# Patient Record
Sex: Female | Born: 1935 | Race: White | Hispanic: No | Marital: Married | Smoking: Never smoker
Health system: Southern US, Community
[De-identification: ages and names within clinical notes are randomized; demographics above are authoritative.]

## PROBLEM LIST (undated history)

## (undated) DIAGNOSIS — I2510 Atherosclerotic heart disease of native coronary artery without angina pectoris: Secondary | ICD-10-CM

## (undated) DIAGNOSIS — C801 Malignant (primary) neoplasm, unspecified: Secondary | ICD-10-CM

## (undated) DIAGNOSIS — J189 Pneumonia, unspecified organism: Secondary | ICD-10-CM

## (undated) DIAGNOSIS — I10 Essential (primary) hypertension: Secondary | ICD-10-CM

## (undated) DIAGNOSIS — I209 Angina pectoris, unspecified: Secondary | ICD-10-CM

## (undated) DIAGNOSIS — M1990 Unspecified osteoarthritis, unspecified site: Secondary | ICD-10-CM

## (undated) DIAGNOSIS — R51 Headache: Secondary | ICD-10-CM

## (undated) DIAGNOSIS — R011 Cardiac murmur, unspecified: Secondary | ICD-10-CM

## (undated) HISTORY — PX: TONSILLECTOMY: SUR1361

## (undated) HISTORY — PX: TOTAL KNEE ARTHROPLASTY: SHX125

## (undated) HISTORY — PX: OTHER SURGICAL HISTORY: SHX169

## (undated) HISTORY — PX: KNEE SURGERY: SHX244

## (undated) HISTORY — PX: ABDOMINAL HYSTERECTOMY: SHX81

## (undated) HISTORY — PX: APPENDECTOMY: SHX54

---

## 2004-05-24 ENCOUNTER — Other Ambulatory Visit: Payer: Self-pay

## 2004-10-19 ENCOUNTER — Emergency Department: Payer: Self-pay | Admitting: Emergency Medicine

## 2004-11-23 ENCOUNTER — Ambulatory Visit: Payer: Self-pay | Admitting: Internal Medicine

## 2005-10-04 ENCOUNTER — Ambulatory Visit: Payer: Self-pay | Admitting: Internal Medicine

## 2006-06-01 ENCOUNTER — Ambulatory Visit: Payer: Self-pay | Admitting: Internal Medicine

## 2006-10-04 ENCOUNTER — Ambulatory Visit: Payer: Self-pay

## 2008-04-15 ENCOUNTER — Ambulatory Visit: Payer: Self-pay | Admitting: Internal Medicine

## 2008-06-23 ENCOUNTER — Ambulatory Visit: Payer: Self-pay | Admitting: General Practice

## 2008-06-30 ENCOUNTER — Inpatient Hospital Stay: Payer: Self-pay | Admitting: General Practice

## 2009-06-23 ENCOUNTER — Ambulatory Visit: Payer: Self-pay | Admitting: Internal Medicine

## 2010-12-10 ENCOUNTER — Ambulatory Visit: Payer: Self-pay | Admitting: Rheumatology

## 2011-11-30 ENCOUNTER — Ambulatory Visit: Payer: Self-pay | Admitting: Internal Medicine

## 2012-09-27 ENCOUNTER — Inpatient Hospital Stay: Payer: Self-pay | Admitting: Internal Medicine

## 2012-09-28 ENCOUNTER — Inpatient Hospital Stay (HOSPITAL_COMMUNITY)
Admission: RE | Admit: 2012-09-28 | Discharge: 2012-10-07 | DRG: 236 | Disposition: A | Discharge status: Home / Self Care | Payer: PRIVATE HEALTH INSURANCE | Source: Other Acute Inpatient Hospital | Attending: Cardiothoracic Surgery | Admitting: Cardiothoracic Surgery

## 2012-09-28 ENCOUNTER — Encounter (HOSPITAL_COMMUNITY): Payer: Self-pay | Admitting: General Practice

## 2012-09-28 ENCOUNTER — Inpatient Hospital Stay (HOSPITAL_COMMUNITY): Payer: PRIVATE HEALTH INSURANCE

## 2012-09-28 ENCOUNTER — Other Ambulatory Visit: Payer: Self-pay | Admitting: *Deleted

## 2012-09-28 DIAGNOSIS — Z96659 Presence of unspecified artificial knee joint: Secondary | ICD-10-CM

## 2012-09-28 DIAGNOSIS — Z7982 Long term (current) use of aspirin: Secondary | ICD-10-CM

## 2012-09-28 DIAGNOSIS — Z79899 Other long term (current) drug therapy: Secondary | ICD-10-CM

## 2012-09-28 DIAGNOSIS — I10 Essential (primary) hypertension: Secondary | ICD-10-CM

## 2012-09-28 DIAGNOSIS — N6009 Solitary cyst of unspecified breast: Secondary | ICD-10-CM

## 2012-09-28 DIAGNOSIS — E785 Hyperlipidemia, unspecified: Secondary | ICD-10-CM | POA: Diagnosis present

## 2012-09-28 DIAGNOSIS — I2510 Atherosclerotic heart disease of native coronary artery without angina pectoris: Principal | ICD-10-CM | POA: Diagnosis present

## 2012-09-28 DIAGNOSIS — K219 Gastro-esophageal reflux disease without esophagitis: Secondary | ICD-10-CM

## 2012-09-28 DIAGNOSIS — E871 Hypo-osmolality and hyponatremia: Secondary | ICD-10-CM | POA: Diagnosis not present

## 2012-09-28 DIAGNOSIS — E8779 Other fluid overload: Secondary | ICD-10-CM | POA: Diagnosis not present

## 2012-09-28 DIAGNOSIS — D62 Acute posthemorrhagic anemia: Secondary | ICD-10-CM | POA: Diagnosis not present

## 2012-09-28 DIAGNOSIS — F411 Generalized anxiety disorder: Secondary | ICD-10-CM | POA: Diagnosis present

## 2012-09-28 DIAGNOSIS — I200 Unstable angina: Secondary | ICD-10-CM | POA: Diagnosis present

## 2012-09-28 DIAGNOSIS — Z01810 Encounter for preprocedural cardiovascular examination: Secondary | ICD-10-CM

## 2012-09-28 DIAGNOSIS — M1990 Unspecified osteoarthritis, unspecified site: Secondary | ICD-10-CM | POA: Diagnosis present

## 2012-09-28 DIAGNOSIS — Z85828 Personal history of other malignant neoplasm of skin: Secondary | ICD-10-CM

## 2012-09-28 HISTORY — DX: Angina pectoris, unspecified: I20.9

## 2012-09-28 HISTORY — DX: Cardiac murmur, unspecified: R01.1

## 2012-09-28 HISTORY — DX: Atherosclerotic heart disease of native coronary artery without angina pectoris: I25.10

## 2012-09-28 HISTORY — DX: Essential (primary) hypertension: I10

## 2012-09-28 HISTORY — DX: Malignant (primary) neoplasm, unspecified: C80.1

## 2012-09-28 HISTORY — DX: Unspecified osteoarthritis, unspecified site: M19.90

## 2012-09-28 HISTORY — DX: Pneumonia, unspecified organism: J18.9

## 2012-09-28 HISTORY — DX: Headache: R51

## 2012-09-28 MED ORDER — ATORVASTATIN CALCIUM 20 MG PO TABS
20.0000 mg | ORAL_TABLET | Freq: Every day | ORAL | Status: DC
  Administered 2012-09-28 – 2012-09-30 (×3): 20 mg via ORAL
  Filled 2012-09-28 (×4): qty 1

## 2012-09-28 MED ORDER — SODIUM CHLORIDE 0.9 % IJ SOLN
3.0000 mL | Freq: Two times a day (BID) | INTRAMUSCULAR | Status: DC
  Administered 2012-09-28 – 2012-09-30 (×6): 3 mL via INTRAVENOUS

## 2012-09-28 MED ORDER — PANTOPRAZOLE SODIUM 40 MG PO TBEC
40.0000 mg | DELAYED_RELEASE_TABLET | Freq: Every day | ORAL | Status: DC
  Administered 2012-09-28 – 2012-09-30 (×3): 40 mg via ORAL
  Filled 2012-09-28 (×3): qty 1

## 2012-09-28 MED ORDER — DOCUSATE SODIUM 100 MG PO CAPS: 100.0000 mg | ORAL_CAPSULE | Freq: Two times a day (BID) | ORAL | Status: DC | PRN

## 2012-09-28 MED ORDER — ASPIRIN EC 81 MG PO TBEC
81.0000 mg | DELAYED_RELEASE_TABLET | Freq: Every day | ORAL | Status: DC
  Administered 2012-09-29 – 2012-09-30 (×2): 81 mg via ORAL
  Filled 2012-09-28 (×3): qty 1

## 2012-09-28 MED ORDER — ENOXAPARIN SODIUM 80 MG/0.8ML ~~LOC~~ SOLN
1.0000 mg/kg | Freq: Two times a day (BID) | SUBCUTANEOUS | Status: AC
  Administered 2012-09-28 – 2012-09-30 (×5): 80 mg via SUBCUTANEOUS
  Filled 2012-09-28 (×6): qty 0.8

## 2012-09-28 MED ORDER — ONDANSETRON HCL 4 MG/2ML IJ SOLN: 4.0000 mg | Freq: Four times a day (QID) | INTRAMUSCULAR | Status: DC | PRN

## 2012-09-28 MED ORDER — LOPERAMIDE HCL 2 MG PO CAPS
2.0000 mg | ORAL_CAPSULE | ORAL | Status: DC | PRN
  Filled 2012-09-28: qty 1

## 2012-09-28 MED ORDER — PRAMOXINE-ZINC OXIDE IN MO 1-12.5 % RE OINT
1.0000 "application " | TOPICAL_OINTMENT | Freq: Three times a day (TID) | RECTAL | Status: DC | PRN
  Filled 2012-09-28: qty 28.3

## 2012-09-28 MED ORDER — ACETAMINOPHEN 325 MG PO TABS
650.0000 mg | ORAL_TABLET | ORAL | Status: DC | PRN
  Administered 2012-09-28: 650 mg via ORAL
  Filled 2012-09-28: qty 2

## 2012-09-28 MED ORDER — NITROGLYCERIN 2 % TD OINT
1.0000 [in_us] | TOPICAL_OINTMENT | Freq: Four times a day (QID) | TRANSDERMAL | Status: DC
  Administered 2012-09-28 – 2012-10-01 (×10): 1 [in_us] via TOPICAL
  Filled 2012-09-28: qty 30

## 2012-09-28 MED ORDER — NITROGLYCERIN 0.4 MG SL SUBL
0.4000 mg | SUBLINGUAL_TABLET | SUBLINGUAL | Status: DC | PRN
  Administered 2012-09-29: 0.4 mg via SUBLINGUAL
  Filled 2012-09-28: qty 25

## 2012-09-28 MED ORDER — ALBUTEROL SULFATE (5 MG/ML) 0.5% IN NEBU
2.5000 mg | INHALATION_SOLUTION | Freq: Once | RESPIRATORY_TRACT | Status: AC
  Administered 2012-09-28: 2.5 mg via RESPIRATORY_TRACT

## 2012-09-28 MED ORDER — GUAIFENESIN-DM 100-10 MG/5ML PO SYRP: 15.0000 mL | ORAL_SOLUTION | ORAL | Status: DC | PRN

## 2012-09-28 MED ORDER — SODIUM CHLORIDE 0.9 % IJ SOLN: 3.0000 mL | INTRAMUSCULAR | Status: DC | PRN

## 2012-09-28 MED ORDER — METOPROLOL TARTRATE 12.5 MG HALF TABLET
12.5000 mg | ORAL_TABLET | Freq: Two times a day (BID) | ORAL | Status: DC
  Administered 2012-09-28 – 2012-09-30 (×5): 12.5 mg via ORAL
  Filled 2012-09-28 (×7): qty 1

## 2012-09-28 MED ORDER — ALUM & MAG HYDROXIDE-SIMETH 200-200-20 MG/5ML PO SUSP: 30.0000 mL | ORAL | Status: DC | PRN

## 2012-09-28 MED ORDER — SODIUM CHLORIDE 0.9 % IV SOLN: 250.0000 mL | INTRAVENOUS | Status: DC | PRN

## 2012-09-28 NOTE — Progress Notes (Signed)
VASCULAR LAB PRELIMINARY  PRELIMINARY  PRELIMINARY  PRELIMINARY  Right Lower Extremity Vein Map    Right Great Saphenous Vein   Segment Diameter Comment  1. Origin 7.32mm   2. High Thigh 4.62mm   3. Mid Thigh 4.18mm branch  4. Low Thigh 3.45mm   5. At Knee 3.43mm   6. High Calf 2.43mm branch  7. Low Calf 2mm branch  8. Ankle 2.71mm    mm    mm    mm       Left Lower Extremity Vein Map    Left Great Saphenous Vein   Segment Diameter Comment  1. Origin 8.53mm   2. High Thigh 4.72mm   3. Mid Thigh 4.47mm   4. Low Thigh 4.35mm   5. At Knee 3.66mm   6. High Calf 4mm Branch  7. Low Calf 3.77mm Multiple branches  8. Ankle 2.54mm    mm    mm    mm     Farrel Demark, RDMS, RVT 09/28/2012, 6:37 PM

## 2012-09-28 NOTE — H&P (Signed)
[address]            [place] [address]          [phone]       MALAY FANTROY [facility] Medical Record #[id number] Date of Birth: [birth year]  Referring: No ref. provider found Primary Care: Yates Decamp, MD  Chief Complaint:  Chest Pain History of Present Illness:    78 year old WF transferred from [facility] where she was evaluated for chest pain and found to have severe CAD and has been transferred to [facility] for CABG. Cardiac cath  Shows severe 3 vessel disease.She primarily describe exertional CP with ambulation worsening over the past 6 months. No rest symptoms. The pain is relieved with rest. The pain does not radiate. There is occasional shortness of breath. She had an abnormal stress test prior to catheterization.     Current Activity/ Functional Status: Patient is independent with mobility/ambulation, transfers, ADL's, IADL's.   PMH  1. HTN 2 hyperlipidemia 3 GERD 4 Chest pain 5 Abnormal EKG 6 allergies 7 breast cysts 8 cataracts 9 hemmorrhoids 10 skin cancer  PSH  Left knee Right knee total arthroplasty Bil cateracts Total hyysterectomy age 50 Tonsils appy 6 skin cancer removal    SH married  No tobacco /Etoh Retired  FH Brain cancer- mother Epilepsy - sibling MI- sibling Stroke- mother and sibling    Allergies -oxycodone Medications at home Amlodipine-benazepril 5-10mg  daily Asa 325 mg daily Vit c 100 mg daily Calcium 500 mg daily Vit e 1 tablet daily Vit B12 1000 mg daily HCTZ 25 mg daily Premarin 1,25 mg daily VIT D3 1000 units daily Voltaren gel prn  Medications at transfer: ASA 81 mg daily NTG 2% ointment q 4 hours Colace 100mg  bid PRN: NTG sl  maalox ambien Tylenol MOM phenargan xanax      Review of Systems:     Cardiac Review of Systems: Y or N  Chest Pain Cove.Etienne    ]  Resting SOB [   ] Exertional SOB  [ y ]  Pollyann Kennedy Milo.Brash  ]   Pedal Edema Cove.Etienne   ]    Palpitations [ n ] Syncope   [  ]   Presyncope Cove.Etienne   ]  General Review of Systems: [Y] = yes [  ]=no Constitional: recent weight change Cove.Etienne  ]; anorexia [  ]; fatigue Cove.Etienne  ]; nausea Cove.Etienne  ]; night sweats [ y ]; fever [  ]; or chills [  ];                                                                                                                                          Dental: poor dentition[  ]; Last Dentist visit: 1 month ago  Eye : blurred  vision [  ]; diplopia [   ]; vision changes [  ];  Amaurosis fugax[  ]; Resp: cough [ y ];  wheezing[  ];  hemoptysis[  ]; shortness of breath[ y ]; paroxysmal nocturnal dyspnea[  ]; dyspnea on exertion[y  ]; or orthopnea[y  ];since hospitalization  GI:  gallstones[  ], vomiting[  ];  dysphagia[y  ]; melena[  ];  hematochezia [  ]; heartburn[y  ];   Hx of  Colonoscopy[  ]; GU: kidney stones [  ]; hematuria[  ];   dysuria [  ];  nocturia[  ];  history of     obstruction [  ];             Skin: rash, swelling[  ];, hair loss[  ];  peripheral edema[y  ];  or itching[  ]; Musculosketetal: myalgias[  ];  joint swelling[  ];  joint erythema[  ];  joint pain[  ];  back pain[  ];  Heme/Lymph: bruising[y  ];  bleeding[  ];  anemia[  ];  Neuro: TIA[  ];  headaches[ y ];  stroke[  ];  vertigo[  ];  seizures[  ];   paresthesias[  ];  difficulty walking[  ];  Psych:depression[  ]; anxiety[  ];  Endocrine: diabetes[  ];  thyroid dysfunction[  ];  Immunizations: Flu [  ]; Pneumococcal[  ];  Other:  Physical Exam: BP 171/78  Pulse 71  Temp 98 F (36.7 C) (Oral)  Resp 18  SpO2 96%  General appearance: alert, cooperative and no distress Neurologic: intact Heart: regular rate and rhythm and S1, S2 normal Lungs: clear to auscultation bilaterally Abdomen: soft, non-tender; bowel sounds normal; no masses,  no organomegaly Extremities: mild edema,, + femoral pulses, absent DP/PT to palp skin- no rashes or lesions Gu/rectal-deferred Heent- NCAT, Perrl, no pharyngeal exudates/erethema Neck  supple' no adenopathy, no bruits   Diagnostic Studies & Laboratory data:     Recent Radiology Findings:   No results found.    Recent Lab Findings: No results found for this basename: WBC, HGB, HCT, PLT, GLUCOSE, CHOL, TRIG, HDL, LDLDIRECT, LDLCALC, ALT, AST, NA, K, CL, CREATININE, BUN, CO2, TSH, INR, GLUF, HGBA1C   Cath from [place] reviewed with 3 vessel disease and preserved lv function   Assessment / Plan:    Severe 3 vessel CAD  For CABG  The goals risks and alternatives of the planned surgical procedure CABG  have been discussed with the patient in detail. The risks of the procedure including death, infection, stroke, myocardial infarction, bleeding, blood transfusion have all been discussed specifically.  I have quoted Doristine Locks Scheff a 5 % of perioperative mortality and a complication rate as high as 25 %. The patient's questions have been answered.Yoseline Fetter Maione is willing  to proceed with the planned procedure.       Delight Ovens MD  Beeper [phone] Office [phone] 09/28/2012 5:18 PM

## 2012-09-28 NOTE — Progress Notes (Signed)
ANTICOAGULATION CONSULT NOTE - Initial Consult  Pharmacy Consult for Lovenox Indication: chest pain/ACS  Allergies  Allergen Reactions  . Neosporin (Neomycin-Bacitracin Zn-Polymyx) Rash    Patient Measurements: Height: 5' (152.4 cm) Weight: 179 lb 1.6 oz (81.24 kg) IBW/kg (Calculated) : 45.5    Vital Signs: Temp: 98 F (36.7 C) (11/01 1316) Temp src: Oral (11/01 1316) BP: 160/72 mmHg (11/01 1654) Pulse Rate: 70  (11/01 1654)  Labs: No results found for this basename: HGB:2,HCT:3,PLT:3,APTT:3,LABPROT:3,INR:3,HEPARINUNFRC:3,CREATININE:3,CKTOTAL:3,CKMB:3,TROPONINI:3 in the last 72 hours  CrCl is unknown because no creatinine reading has been taken.   Medical History: Past Medical History  Diagnosis Date  . Coronary artery disease   . Anginal pain   . Hypertension   . Pneumonia     hx of PNA  . Heart murmur   . Headache   . Cancer     FACIAL SKIN CANCER  . Arthritis      Assessment: 84yof seen at [facility] for chest pain.  She underwent heart cath which showed 3v CAD and plan is for CABG next week.  Begin Lovenox 1mg /kg q12 for anticoagulation until surgery.  Labs drawn at [facility] - cbc stable, K . 4, Cr 0.9, Crcl 40 ml/min.    Goal of Therapy:  Anti-Xa level 0.6-1.2 units/ml 4hrs after LMWH dose given Monitor platelets by anticoagulation protocol: Yes   Plan:  Lovenox 80mg  sq q12hr moniotor cbc, and renal function    Marcelino Scot 09/28/2012,6:26 PM

## 2012-09-28 NOTE — Progress Notes (Signed)
PFT completed. Pt unable to complete Lung Volume test. Unconfirmed results placed Shadow Chart.

## 2012-09-28 NOTE — Progress Notes (Addendum)
Pre-op Cardiac Surgery  Carotid Findings: Bilaterally no significant ICA stenosis with antegrade vertebral flow.   Upper Extremity Right Left  Brachial Pressures 151 155  Radial Waveforms Tri Tri  Ulnar Waveforms Tri Tri  Palmar Arch (Allen's Test) Normal with radial compression, decreases >50% with ulnar compression Normal with radial compression, obliterates with ulnar compression     Lower  Extremity Right Left  Dorsalis Pedis 162, mono 137, mono  Anterior Tibial    Posterior Tibial 90, bi 159, mono  Ankle/Brachial Indices 1.05 1.03  Due to Technical difficulties, lower extremity pressures may be falsely lower.   Farrel Demark, RDMS, RVT 09/28/2012

## 2012-09-28 NOTE — Progress Notes (Signed)
09/28/2012 5:24 PM Nursing note Upon return from PFT, pt. C/o 5/10 chest pain. Non-radiating. Denies nausea or dizziness. BP 160/72 HR 72. EKG performed no acute changes noted from prior. Gershon Crane [facility] paged and made aware. No new orders at this time. Dr. Tyrone Sage on floor to see pt. Updated on issue. NTG patch changed per prior orders. Upon Reassessment pt. States she is feeling better. Pt. Instructed to notify RN promptly if CP returns. Will continue to closely monitor patient.  Kimmberly Wisser, Blanchard Kelch

## 2012-09-29 LAB — PROTIME-INR
INR: 1.06 (ref 0.00–1.49)
Prothrombin Time: 13.7 seconds (ref 11.6–15.2)

## 2012-09-29 LAB — BASIC METABOLIC PANEL
CO2: 25 mEq/L (ref 19–32)
Chloride: 101 mEq/L (ref 96–112)
Sodium: 137 mEq/L (ref 135–145)

## 2012-09-29 LAB — CBC
Platelets: 311 10*3/uL (ref 150–400)
RBC: 4.17 MIL/uL (ref 3.87–5.11)
WBC: 7.3 10*3/uL (ref 4.0–10.5)

## 2012-09-29 LAB — LIPID PANEL
HDL: 54 mg/dL (ref >39)
Triglycerides: 235 mg/dL — ABNORMAL HIGH (ref <150)

## 2012-09-29 LAB — GLUCOSE, CAPILLARY: Glucose-Capillary: 115 mg/dL — ABNORMAL HIGH (ref 70–99)

## 2012-09-29 NOTE — Progress Notes (Addendum)
                    [address]            [place] [address]          [phone]      Subjective: Transient episode of CP last night after arrival.  None since then.  Denies SOB.   Objective: Vital signs in last 24 hours: Patient Vitals for the past 24 hrs:  BP Temp Temp src Pulse Resp SpO2 Height Weight  09/29/12 0541 - - - - - - - 177 lb 0.5 oz (80.3 kg)  09/29/12 0533 141/55 mmHg 97.9 F (36.6 C) Oral 68  16  96 % - -  09/28/12 1939 149/55 mmHg 97.8 F (36.6 C) Oral 67  18  95 % - -  09/28/12 1731 - - - - - - 5\' 3"  (1.6 m) 179 lb 1.6 oz (81.24 kg)  09/28/12 1654 160/72 mmHg - - 70  - - - -  09/28/12 1316 171/78 mmHg 98 F (36.7 C) Oral 71  18  96 % - -   Current Weight  09/29/12 177 lb 0.5 oz (80.3 kg)     Intake/Output from previous day: 11/01 0701 - 11/02 0700 In: 3 [I.V.:3] Out: -     PHYSICAL EXAM:  Heart: RRR Lungs:Clear Extremities:Warm, well perfused   Lab Results: CBC: Basename 09/29/12 0615  WBC 7.3  HGB 12.9  HCT 38.4  PLT 311   BMET:  Basename 09/29/12 0615  NA 137  K 4.2  CL 101  CO2 25  GLUCOSE 118*  BUN 11  CREATININE 0.81  CALCIUM 9.0    PT/INR:  Basename 09/29/12 0615  LABPROT 13.7  INR 1.06      Assessment/Plan: CAD- Stable, no further CP. For CABG on Monday.    LOS: 1 day    [name],[name] 09/29/2012   On lovenox Dopplers and vein mapping done   I have seen and examined Kimberly Scott and agree with the above assessment  and plan.  Delight Ovens MD Beeper [phone] Office [phone] 09/29/2012 11:33 AM

## 2012-09-30 ENCOUNTER — Inpatient Hospital Stay (HOSPITAL_COMMUNITY): Payer: PRIVATE HEALTH INSURANCE

## 2012-09-30 LAB — BLOOD GAS, ARTERIAL
Acid-base deficit: 0.2 mmol/L (ref 0.0–2.0)
Bicarbonate: 23.3 mEq/L (ref 20.0–24.0)
Drawn by: 280981
FIO2: 0.21 %
O2 Saturation: 98 %
Patient temperature: 98.6
TCO2: 24.3 mmol/L (ref 0–100)
pCO2 arterial: 33.8 mmHg — ABNORMAL LOW (ref 35.0–45.0)
pH, Arterial: 7.453 — ABNORMAL HIGH (ref 7.350–7.450)
pO2, Arterial: 85.8 mmHg (ref 80.0–100.0)

## 2012-09-30 LAB — URINALYSIS, ROUTINE W REFLEX MICROSCOPIC
Bilirubin Urine: NEGATIVE
Glucose, UA: NEGATIVE mg/dL
Hgb urine dipstick: NEGATIVE
Ketones, ur: NEGATIVE mg/dL
Leukocytes, UA: NEGATIVE
Nitrite: NEGATIVE
Protein, ur: NEGATIVE mg/dL
Specific Gravity, Urine: 1.01 (ref 1.005–1.030)
Urobilinogen, UA: 0.2 mg/dL (ref 0.0–1.0)
pH: 6 (ref 5.0–8.0)

## 2012-09-30 LAB — ABO/RH: ABO/RH(D): A POS

## 2012-09-30 LAB — GLUCOSE, CAPILLARY
Glucose-Capillary: 138 mg/dL — ABNORMAL HIGH (ref 70–99)
Glucose-Capillary: 125 mg/dL — ABNORMAL HIGH (ref 70–99)

## 2012-09-30 MED ORDER — VANCOMYCIN HCL 1000 MG IV SOLR
1250.0000 mg | INTRAVENOUS | Status: AC
  Administered 2012-10-01: 1250 mg via INTRAVENOUS
  Filled 2012-09-30: qty 1250

## 2012-09-30 MED ORDER — PHENYLEPHRINE HCL 10 MG/ML IJ SOLN
30.0000 ug/min | INTRAVENOUS | Status: DC
  Filled 2012-09-30: qty 2

## 2012-09-30 MED ORDER — TEMAZEPAM 15 MG PO CAPS: 15.0000 mg | ORAL_CAPSULE | Freq: Once | ORAL | Status: AC | PRN

## 2012-09-30 MED ORDER — BISACODYL 5 MG PO TBEC
5.0000 mg | DELAYED_RELEASE_TABLET | Freq: Once | ORAL | Status: AC
  Administered 2012-09-30: 5 mg via ORAL
  Filled 2012-09-30: qty 1

## 2012-09-30 MED ORDER — MAGNESIUM SULFATE 50 % IJ SOLN
40.0000 meq | INTRAMUSCULAR | Status: DC
  Filled 2012-09-30: qty 10

## 2012-09-30 MED ORDER — POTASSIUM CHLORIDE 2 MEQ/ML IV SOLN
80.0000 meq | INTRAVENOUS | Status: DC
  Filled 2012-09-30: qty 40

## 2012-09-30 MED ORDER — DEXTROSE 5 % IV SOLN
1.5000 g | INTRAVENOUS | Status: AC
  Administered 2012-10-01: 1.5 g via INTRAVENOUS
  Administered 2012-10-01: .75 g via INTRAVENOUS
  Filled 2012-09-30: qty 1.5

## 2012-09-30 MED ORDER — PLASMA-LYTE 148 IV SOLN
INTRAVENOUS | Status: AC
  Administered 2012-10-01: 11:00:00
  Filled 2012-09-30: qty 2.5

## 2012-09-30 MED ORDER — CHLORHEXIDINE GLUCONATE 4 % EX LIQD
60.0000 mL | Freq: Once | CUTANEOUS | Status: AC
  Administered 2012-10-01: 4 via TOPICAL
  Filled 2012-09-30: qty 60

## 2012-09-30 MED ORDER — EPINEPHRINE HCL 1 MG/ML IJ SOLN
0.5000 ug/min | INTRAVENOUS | Status: DC
  Filled 2012-09-30: qty 4

## 2012-09-30 MED ORDER — NITROGLYCERIN IN D5W 200-5 MCG/ML-% IV SOLN
2.0000 ug/min | INTRAVENOUS | Status: DC
  Filled 2012-09-30: qty 250

## 2012-09-30 MED ORDER — METOPROLOL TARTRATE 12.5 MG HALF TABLET
12.5000 mg | ORAL_TABLET | Freq: Once | ORAL | Status: AC
  Administered 2012-10-01: 12.5 mg via ORAL
  Filled 2012-09-30: qty 1

## 2012-09-30 MED ORDER — DEXTROSE 5 % IV SOLN
750.0000 mg | INTRAVENOUS | Status: DC
  Filled 2012-09-30: qty 750

## 2012-09-30 MED ORDER — DOPAMINE-DEXTROSE 3.2-5 MG/ML-% IV SOLN
2.0000 ug/kg/min | INTRAVENOUS | Status: DC
  Filled 2012-09-30: qty 250

## 2012-09-30 MED ORDER — DEXMEDETOMIDINE HCL IN NACL 400 MCG/100ML IV SOLN
0.1000 ug/kg/h | INTRAVENOUS | Status: AC
  Administered 2012-10-01: 0.2 ug/kg/h via INTRAVENOUS
  Filled 2012-09-30: qty 100

## 2012-09-30 MED ORDER — SODIUM CHLORIDE 0.9 % IV SOLN
INTRAVENOUS | Status: AC
  Administered 2012-10-01: 1 [IU]/h via INTRAVENOUS
  Filled 2012-09-30: qty 1

## 2012-09-30 MED ORDER — SODIUM CHLORIDE 0.9 % IV SOLN
INTRAVENOUS | Status: AC
  Administered 2012-10-01: 70 mL/h via INTRAVENOUS
  Filled 2012-09-30: qty 40

## 2012-09-30 NOTE — Progress Notes (Signed)
Patient ID: Kimberly Scott, female   DOB: 1928/04/25, 78 y.o.   MRN: [id number]                   [address]            [place] [address]          [phone]       Procedure(s) (LRB): CORONARY ARTERY BYPASS GRAFTING (CABG) (N/A)  LOS: 2 days   Subjective: No chest pain last night  Objective: Vital signs in last 24 hours: Patient Vitals for the past 24 hrs:  BP Temp Temp src Pulse Resp SpO2  09/30/12 0414 139/57 mmHg 98.3 F (36.8 C) Oral 67  18  99 %  09/29/12 2011 153/55 mmHg 97.9 F (36.6 C) Oral 65  18  100 %  09/29/12 1410 152/55 mmHg 97.5 F (36.4 C) - 64  18  100 %    Filed Weights   09/28/12 1731 09/29/12 0541  Weight: 179 lb 1.6 oz (81.24 kg) 177 lb 0.5 oz (80.3 kg)    Hemodynamic parameters for last 24 hours:    Intake/Output from previous day: 11/02 0701 - 11/03 0700 In: 480 [P.O.:480] Out: -  Intake/Output this shift: Total I/O In: 240 [P.O.:240] Out: -   Scheduled Meds:   . aspirin EC  81 mg Oral Daily  . atorvastatin  20 mg Oral q1800  . bisacodyl  5 mg Oral Once  . chlorhexidine  60 mL Topical Once  . enoxaparin (LOVENOX) injection  1 mg/kg Subcutaneous Q12H  . metoprolol tartrate  12.5 mg Oral BID  . metoprolol tartrate  12.5 mg Oral Once  . nitroGLYCERIN  1 inch Topical Q6H  . pantoprazole  40 mg Oral Daily  . sodium chloride  3 mL Intravenous Q12H   Continuous Infusions:  PRN Meds:.sodium chloride, acetaminophen, alum & mag hydroxide-simeth, docusate sodium, guaiFENesin-dextromethorphan, loperamide, nitroGLYCERIN, ondansetron (ZOFRAN) IV, pramoxine-mineral oil-zinc, sodium chloride, temazepam  General appearance: alert, cooperative and no distress Neurologic: intact Heart: regular rate and rhythm, S1, S2 normal, no murmur, click, rub or gallop and normal apical impulse Lungs: clear to auscultation bilaterally and normal percussion bilaterally Abdomen: soft, non-tender; bowel sounds normal; no masses,  no  organomegaly Extremities: extremities normal, atraumatic, no cyanosis or edema and Homans sign is negative, no sign of DVT  Lab Results: CBC: Basename 09/29/12 0615  WBC 7.3  HGB 12.9  HCT 38.4  PLT 311   BMET:  Basename 09/29/12 0615  NA 137  K 4.2  CL 101  CO2 25  GLUCOSE 118*  BUN 11  CREATININE 0.81  CALCIUM 9.0    PT/INR:  Basename 09/29/12 0615  LABPROT 13.7  INR 1.06     Radiology No results found.   Assessment/Plan: S/P Procedure(s) (LRB): CORONARY ARTERY BYPASS GRAFTING (CABG) (N/A)   The goals risks and alternatives of the planned surgical procedure CABG have been discussed with the patient in detail. The risks of the procedure including death, infection, stroke, myocardial infarction, bleeding, blood transfusion have all been discussed specifically.  I have quoted Kimberly Scott a 5% of perioperative mortality and a complication rate as high as 25%. The patient's questions have been answered.Kimberly Scott is willing  to proceed with the planned procedure.   Delight Ovens MD 09/30/2012 10:28 AM

## 2012-09-30 NOTE — Progress Notes (Signed)
                    [address]            [place] [address]          [phone]       Procedure(s) (LRB): CORONARY ARTERY BYPASS GRAFTING (CABG) (N/A)  Subjective: Denies CP or SOB.  Has been moving around in room without problem.   Objective: Vital signs in last 24 hours: Patient Vitals for the past 24 hrs:  BP Temp Temp src Pulse Resp SpO2  09/30/12 0414 139/57 mmHg 98.3 F (36.8 C) Oral 67  18  99 %  09/29/12 2011 153/55 mmHg 97.9 F (36.6 C) Oral 65  18  100 %  09/29/12 1410 152/55 mmHg 97.5 F (36.4 C) - 64  18  100 %   Current Weight  09/29/12 177 lb 0.5 oz (80.3 kg)     Intake/Output from previous day: 11/02 0701 - 11/03 0700 In: 480 [P.O.:480] Out: -     PHYSICAL EXAM:  Heart: RRR Lungs: Clear Abdomen: soft, NT/ND, +BS Extremities: Warm, well perfused    Lab Results: CBC: Basename 09/29/12 0615  WBC 7.3  HGB 12.9  HCT 38.4  PLT 311   BMET:  Basename 09/29/12 0615  NA 137  K 4.2  CL 101  CO2 25  GLUCOSE 118*  BUN 11  CREATININE 0.81  CALCIUM 9.0    PT/INR:  Basename 09/29/12 0615  LABPROT 13.7  INR 1.06      Assessment/Plan: CAD- stable, no further CP. For OR in am.   LOS: 2 days    Kimberly Scott H 09/30/2012

## 2012-10-01 ENCOUNTER — Inpatient Hospital Stay (HOSPITAL_COMMUNITY): Payer: PRIVATE HEALTH INSURANCE

## 2012-10-01 ENCOUNTER — Encounter (HOSPITAL_COMMUNITY): Payer: Self-pay | Admitting: Anesthesiology

## 2012-10-01 ENCOUNTER — Inpatient Hospital Stay (HOSPITAL_COMMUNITY): Payer: PRIVATE HEALTH INSURANCE | Admitting: Anesthesiology

## 2012-10-01 ENCOUNTER — Encounter (HOSPITAL_COMMUNITY)
Admission: RE | Disposition: A | Discharge status: Home / Self Care | Payer: Self-pay | Source: Other Acute Inpatient Hospital | Attending: Cardiothoracic Surgery

## 2012-10-01 DIAGNOSIS — I2510 Atherosclerotic heart disease of native coronary artery without angina pectoris: Secondary | ICD-10-CM

## 2012-10-01 HISTORY — PX: CORONARY ARTERY BYPASS GRAFT: SHX141

## 2012-10-01 HISTORY — PX: ENDOVEIN HARVEST OF GREATER SAPHENOUS VEIN: SHX5059

## 2012-10-01 LAB — BASIC METABOLIC PANEL
BUN: 11 mg/dL (ref 6–23)
CO2: 23 mEq/L (ref 19–32)
Calcium: 8.8 mg/dL (ref 8.4–10.5)
Chloride: 100 mEq/L (ref 96–112)
Creatinine, Ser: 0.79 mg/dL (ref 0.50–1.10)
GFR calc Af Amer: 87 mL/min — ABNORMAL LOW (ref >90)
GFR calc non Af Amer: 75 mL/min — ABNORMAL LOW (ref >90)
Glucose, Bld: 108 mg/dL — ABNORMAL HIGH (ref 70–99)
Potassium: 4.2 mEq/L (ref 3.5–5.1)
Sodium: 135 mEq/L (ref 135–145)

## 2012-10-01 LAB — POCT I-STAT 3, ART BLOOD GAS (G3+)
Acid-base deficit: 2 mmol/L (ref 0.0–2.0)
Acid-base deficit: 3 mmol/L — ABNORMAL HIGH (ref 0.0–2.0)
Acid-base deficit: 4 mmol/L — ABNORMAL HIGH (ref 0.0–2.0)
Bicarbonate: 22.3 mEq/L (ref 20.0–24.0)
Bicarbonate: 22.5 mEq/L (ref 20.0–24.0)
Bicarbonate: 20.9 mEq/L (ref 20.0–24.0)
O2 Saturation: 100 %
O2 Saturation: 95 %
O2 Saturation: 95 %
Patient temperature: 35.6
Patient temperature: 36.9
TCO2: 23 mmol/L (ref 0–100)
TCO2: 24 mmol/L (ref 0–100)
TCO2: 22 mmol/L (ref 0–100)
pCO2 arterial: 35.3 mmHg (ref 35.0–45.0)
pCO2 arterial: 40.3 mmHg (ref 35.0–45.0)
pCO2 arterial: 36.9 mmHg (ref 35.0–45.0)
pH, Arterial: 7.409 (ref 7.350–7.450)
pH, Arterial: 7.348 — ABNORMAL LOW (ref 7.350–7.450)
pH, Arterial: 7.361 (ref 7.350–7.450)
pO2, Arterial: 297 mmHg — ABNORMAL HIGH (ref 80.0–100.0)
pO2, Arterial: 73 mmHg — ABNORMAL LOW (ref 80.0–100.0)
pO2, Arterial: 76 mmHg — ABNORMAL LOW (ref 80.0–100.0)

## 2012-10-01 LAB — POCT I-STAT, CHEM 8
BUN: 7 mg/dL (ref 6–23)
Calcium, Ion: 1.03 mmol/L — ABNORMAL LOW (ref 1.13–1.30)
Chloride: 103 mEq/L (ref 96–112)
Creatinine, Ser: 0.8 mg/dL (ref 0.50–1.10)
Glucose, Bld: 165 mg/dL — ABNORMAL HIGH (ref 70–99)
HCT: 31 % — ABNORMAL LOW (ref 36.0–46.0)
Hemoglobin: 10.5 g/dL — ABNORMAL LOW (ref 12.0–15.0)
Potassium: 4.6 mEq/L (ref 3.5–5.1)
Sodium: 136 mEq/L (ref 135–145)
TCO2: 21 mmol/L (ref 0–100)

## 2012-10-01 LAB — POCT I-STAT 4, (NA,K, GLUC, HGB,HCT)
Glucose, Bld: 109 mg/dL — ABNORMAL HIGH (ref 70–99)
Glucose, Bld: 105 mg/dL — ABNORMAL HIGH (ref 70–99)
Glucose, Bld: 93 mg/dL (ref 70–99)
Glucose, Bld: 107 mg/dL — ABNORMAL HIGH (ref 70–99)
Glucose, Bld: 130 mg/dL — ABNORMAL HIGH (ref 70–99)
Glucose, Bld: 124 mg/dL — ABNORMAL HIGH (ref 70–99)
HCT: 32 % — ABNORMAL LOW (ref 36.0–46.0)
HCT: 21 % — ABNORMAL LOW (ref 36.0–46.0)
HCT: 29 % — ABNORMAL LOW (ref 36.0–46.0)
HCT: 21 % — ABNORMAL LOW (ref 36.0–46.0)
HCT: 21 % — ABNORMAL LOW (ref 36.0–46.0)
HCT: 33 % — ABNORMAL LOW (ref 36.0–46.0)
Hemoglobin: 10.9 g/dL — ABNORMAL LOW (ref 12.0–15.0)
Hemoglobin: 7.1 g/dL — ABNORMAL LOW (ref 12.0–15.0)
Hemoglobin: 9.9 g/dL — ABNORMAL LOW (ref 12.0–15.0)
Hemoglobin: 7.1 g/dL — ABNORMAL LOW (ref 12.0–15.0)
Hemoglobin: 7.1 g/dL — ABNORMAL LOW (ref 12.0–15.0)
Hemoglobin: 11.2 g/dL — ABNORMAL LOW (ref 12.0–15.0)
Potassium: 3.9 mEq/L (ref 3.5–5.1)
Potassium: 3.5 mEq/L (ref 3.5–5.1)
Potassium: 3.8 mEq/L (ref 3.5–5.1)
Potassium: 4.9 mEq/L (ref 3.5–5.1)
Potassium: 4.3 mEq/L (ref 3.5–5.1)
Potassium: 4.2 mEq/L (ref 3.5–5.1)
Sodium: 136 mEq/L (ref 135–145)
Sodium: 130 mEq/L — ABNORMAL LOW (ref 135–145)
Sodium: 136 mEq/L (ref 135–145)
Sodium: 135 mEq/L (ref 135–145)
Sodium: 135 mEq/L (ref 135–145)
Sodium: 135 mEq/L (ref 135–145)

## 2012-10-01 LAB — CBC
HCT: 37.3 % (ref 36.0–46.0)
HCT: 32.8 % — ABNORMAL LOW (ref 36.0–46.0)
HCT: 32.2 % — ABNORMAL LOW (ref 36.0–46.0)
Hemoglobin: 12.5 g/dL (ref 12.0–15.0)
Hemoglobin: 11.3 g/dL — ABNORMAL LOW (ref 12.0–15.0)
Hemoglobin: 11.2 g/dL — ABNORMAL LOW (ref 12.0–15.0)
MCH: 30.6 pg (ref 26.0–34.0)
MCH: 31.3 pg (ref 26.0–34.0)
MCHC: 33.5 g/dL (ref 30.0–36.0)
MCHC: 34.5 g/dL (ref 30.0–36.0)
MCHC: 34.8 g/dL (ref 30.0–36.0)
MCV: 91.4 fL (ref 78.0–100.0)
MCV: 89.6 fL (ref 78.0–100.0)
MCV: 89.9 fL (ref 78.0–100.0)
Platelets: 309 10*3/uL (ref 150–400)
Platelets: 161 10*3/uL (ref 150–400)
RBC: 4.08 MIL/uL (ref 3.87–5.11)
RBC: 3.58 MIL/uL — ABNORMAL LOW (ref 3.87–5.11)
RDW: 12.3 % (ref 11.5–15.5)
RDW: 12.5 % (ref 11.5–15.5)
RDW: 12.8 % (ref 11.5–15.5)
WBC: 6.4 10*3/uL (ref 4.0–10.5)
WBC: 8.5 10*3/uL (ref 4.0–10.5)

## 2012-10-01 LAB — POCT I-STAT GLUCOSE
Glucose, Bld: 106 mg/dL — ABNORMAL HIGH (ref 70–99)
Glucose, Bld: 101 mg/dL — ABNORMAL HIGH (ref 70–99)
Operator id: 156951
Operator id: 3406

## 2012-10-01 LAB — PROTIME-INR: INR: 1.49 (ref 0.00–1.49)

## 2012-10-01 LAB — HEMOGLOBIN A1C
Hgb A1c MFr Bld: 6.5 % — ABNORMAL HIGH (ref <5.7)
Mean Plasma Glucose: 140 mg/dL — ABNORMAL HIGH (ref <117)

## 2012-10-01 LAB — GLUCOSE, CAPILLARY
Glucose-Capillary: 108 mg/dL — ABNORMAL HIGH (ref 70–99)
Glucose-Capillary: 98 mg/dL (ref 70–99)
Glucose-Capillary: 90 mg/dL (ref 70–99)
Glucose-Capillary: 120 mg/dL — ABNORMAL HIGH (ref 70–99)
Glucose-Capillary: 171 mg/dL — ABNORMAL HIGH (ref 70–99)

## 2012-10-01 LAB — MAGNESIUM: Magnesium: 2.8 mg/dL — ABNORMAL HIGH (ref 1.5–2.5)

## 2012-10-01 LAB — CREATININE, SERUM
Creatinine, Ser: 0.72 mg/dL (ref 0.50–1.10)
GFR calc Af Amer: >90 mL/min (ref >90)
GFR calc non Af Amer: 78 mL/min — ABNORMAL LOW (ref >90)

## 2012-10-01 LAB — SURGICAL PCR SCREEN
MRSA, PCR: NEGATIVE
Staphylococcus aureus: NEGATIVE

## 2012-10-01 LAB — HEMOGLOBIN AND HEMATOCRIT, BLOOD
HCT: 20.5 % — ABNORMAL LOW (ref 36.0–46.0)
Hemoglobin: 7.1 g/dL — ABNORMAL LOW (ref 12.0–15.0)

## 2012-10-01 LAB — PLATELET COUNT: Platelets: 155 10*3/uL (ref 150–400)

## 2012-10-01 SURGERY — CORONARY ARTERY BYPASS GRAFTING (CABG)
Anesthesia: General | Site: Leg Upper | Wound class: Clean

## 2012-10-01 MED ORDER — MAGNESIUM SULFATE 40 MG/ML IJ SOLN
4.0000 g | Freq: Once | INTRAMUSCULAR | Status: AC
  Administered 2012-10-01: 4 g via INTRAVENOUS
  Filled 2012-10-01: qty 100

## 2012-10-01 MED ORDER — MIDAZOLAM HCL 5 MG/5ML IJ SOLN
INTRAMUSCULAR | Status: DC | PRN
  Administered 2012-10-01: 3 mg via INTRAVENOUS
  Administered 2012-10-01: 1 mg via INTRAVENOUS
  Administered 2012-10-01: 3.5 mg via INTRAVENOUS
  Administered 2012-10-01: 2.5 mg via INTRAVENOUS

## 2012-10-01 MED ORDER — SODIUM CHLORIDE 0.9 % IJ SOLN
3.0000 mL | Freq: Two times a day (BID) | INTRAMUSCULAR | Status: DC
  Administered 2012-10-02 (×2): 3 mL via INTRAVENOUS

## 2012-10-01 MED ORDER — NITROGLYCERIN IN D5W 200-5 MCG/ML-% IV SOLN
INTRAVENOUS | Status: DC | PRN
  Administered 2012-10-01: 16.6 ug/min via INTRAVENOUS

## 2012-10-01 MED ORDER — ASPIRIN 81 MG PO CHEW: 324.0000 mg | CHEWABLE_TABLET | Freq: Every day | ORAL | Status: DC

## 2012-10-01 MED ORDER — SODIUM CHLORIDE 0.9 % IJ SOLN: 3.0000 mL | INTRAMUSCULAR | Status: DC | PRN

## 2012-10-01 MED ORDER — LACTATED RINGERS IV SOLN: 500.0000 mL | Freq: Once | INTRAVENOUS | Status: AC | PRN

## 2012-10-01 MED ORDER — NITROGLYCERIN IN D5W 200-5 MCG/ML-% IV SOLN
0.0000 ug/min | INTRAVENOUS | Status: DC
  Administered 2012-10-02: 20 ug/min via INTRAVENOUS
  Filled 2012-10-01: qty 250

## 2012-10-01 MED ORDER — ATORVASTATIN CALCIUM 20 MG PO TABS
20.0000 mg | ORAL_TABLET | Freq: Every day | ORAL | Status: DC
  Administered 2012-10-02 – 2012-10-06 (×5): 20 mg via ORAL
  Filled 2012-10-01 (×7): qty 1

## 2012-10-01 MED ORDER — MORPHINE SULFATE 2 MG/ML IJ SOLN
2.0000 mg | INTRAMUSCULAR | Status: DC | PRN
  Administered 2012-10-02 (×4): 2 mg via INTRAVENOUS
  Filled 2012-10-01 (×4): qty 1

## 2012-10-01 MED ORDER — SODIUM CHLORIDE 0.9 % IV SOLN
INTRAVENOUS | Status: DC
  Administered 2012-10-01: 0.8 [IU]/h via INTRAVENOUS
  Filled 2012-10-01: qty 1

## 2012-10-01 MED ORDER — ONDANSETRON HCL 4 MG/2ML IJ SOLN
4.0000 mg | Freq: Four times a day (QID) | INTRAMUSCULAR | Status: DC | PRN
  Administered 2012-10-02: 4 mg via INTRAVENOUS
  Filled 2012-10-01: qty 2

## 2012-10-01 MED ORDER — ARTIFICIAL TEARS OP OINT
TOPICAL_OINTMENT | OPHTHALMIC | Status: DC | PRN
  Administered 2012-10-01: 1 via OPHTHALMIC

## 2012-10-01 MED ORDER — FAMOTIDINE IN NACL 20-0.9 MG/50ML-% IV SOLN
20.0000 mg | Freq: Two times a day (BID) | INTRAVENOUS | Status: AC
  Administered 2012-10-01 (×2): 20 mg via INTRAVENOUS
  Filled 2012-10-01: qty 50

## 2012-10-01 MED ORDER — METOPROLOL TARTRATE 1 MG/ML IV SOLN: 2.5000 mg | INTRAVENOUS | Status: DC | PRN

## 2012-10-01 MED ORDER — METOPROLOL TARTRATE 12.5 MG HALF TABLET
12.5000 mg | ORAL_TABLET | Freq: Two times a day (BID) | ORAL | Status: DC
  Administered 2012-10-02 – 2012-10-07 (×9): 12.5 mg via ORAL
  Filled 2012-10-01 (×13): qty 1

## 2012-10-01 MED ORDER — PROTAMINE SULFATE 10 MG/ML IV SOLN
INTRAVENOUS | Status: DC | PRN
  Administered 2012-10-01: 180 mg via INTRAVENOUS
  Administered 2012-10-01 (×2): 10 mg via INTRAVENOUS

## 2012-10-01 MED ORDER — VECURONIUM BROMIDE 10 MG IV SOLR
INTRAVENOUS | Status: DC | PRN
  Administered 2012-10-01: 2 mg via INTRAVENOUS
  Administered 2012-10-01: 3 mg via INTRAVENOUS

## 2012-10-01 MED ORDER — ACETAMINOPHEN 10 MG/ML IV SOLN
1000.0000 mg | Freq: Once | INTRAVENOUS | Status: AC
  Administered 2012-10-01: 1000 mg via INTRAVENOUS
  Filled 2012-10-01: qty 100

## 2012-10-01 MED ORDER — SODIUM CHLORIDE 0.9 % IV SOLN
INTRAVENOUS | Status: DC
Start: 2012-10-01 — End: 2012-10-03
  Administered 2012-10-01: 20 mL via INTRAVENOUS

## 2012-10-01 MED ORDER — SODIUM CHLORIDE 0.9 % IJ SOLN
OROMUCOSAL | Status: DC | PRN
  Administered 2012-10-01 (×3): via TOPICAL

## 2012-10-01 MED ORDER — VANCOMYCIN HCL IN DEXTROSE 1-5 GM/200ML-% IV SOLN
1000.0000 mg | Freq: Once | INTRAVENOUS | Status: AC
  Administered 2012-10-01: 1000 mg via INTRAVENOUS
  Filled 2012-10-01: qty 200

## 2012-10-01 MED ORDER — PANTOPRAZOLE SODIUM 40 MG PO TBEC
40.0000 mg | DELAYED_RELEASE_TABLET | Freq: Every day | ORAL | Status: DC
  Administered 2012-10-03 – 2012-10-06 (×4): 40 mg via ORAL
  Filled 2012-10-01 (×4): qty 1

## 2012-10-01 MED ORDER — DOCUSATE SODIUM 100 MG PO CAPS
200.0000 mg | ORAL_CAPSULE | Freq: Every day | ORAL | Status: DC
  Administered 2012-10-02 – 2012-10-06 (×5): 200 mg via ORAL
  Filled 2012-10-01 (×6): qty 2

## 2012-10-01 MED ORDER — BISACODYL 10 MG RE SUPP: 10.0000 mg | Freq: Every day | RECTAL | Status: DC

## 2012-10-01 MED ORDER — MORPHINE SULFATE 2 MG/ML IJ SOLN
1.0000 mg | INTRAMUSCULAR | Status: AC | PRN
  Administered 2012-10-01 – 2012-10-02 (×2): 1 mg via INTRAVENOUS
  Filled 2012-10-01: qty 1

## 2012-10-01 MED ORDER — LIDOCAINE HCL (CARDIAC) 20 MG/ML IV SOLN
INTRAVENOUS | Status: DC | PRN
  Administered 2012-10-01: 60 mg via INTRAVENOUS

## 2012-10-01 MED ORDER — ACETAMINOPHEN 160 MG/5ML PO SOLN
975.0000 mg | Freq: Four times a day (QID) | ORAL | Status: DC
  Filled 2012-10-01: qty 40.6

## 2012-10-01 MED ORDER — BISACODYL 5 MG PO TBEC
10.0000 mg | DELAYED_RELEASE_TABLET | Freq: Every day | ORAL | Status: DC
  Administered 2012-10-02 – 2012-10-05 (×4): 10 mg via ORAL
  Filled 2012-10-01 (×4): qty 2

## 2012-10-01 MED ORDER — DEXTROSE 5 % IV SOLN
1.5000 g | Freq: Two times a day (BID) | INTRAVENOUS | Status: DC
  Administered 2012-10-01 – 2012-10-02 (×3): 1.5 g via INTRAVENOUS
  Filled 2012-10-01 (×4): qty 1.5

## 2012-10-01 MED ORDER — ACETAMINOPHEN 500 MG PO TABS
1000.0000 mg | ORAL_TABLET | Freq: Four times a day (QID) | ORAL | Status: DC
  Administered 2012-10-02 – 2012-10-03 (×5): 1000 mg via ORAL
  Filled 2012-10-01 (×9): qty 2

## 2012-10-01 MED ORDER — ROCURONIUM BROMIDE 100 MG/10ML IV SOLN
INTRAVENOUS | Status: DC | PRN
  Administered 2012-10-01: 100 mg via INTRAVENOUS

## 2012-10-01 MED ORDER — PHENYLEPHRINE HCL 10 MG/ML IJ SOLN
0.0000 ug/min | INTRAVENOUS | Status: DC
  Filled 2012-10-01: qty 2

## 2012-10-01 MED ORDER — PROPOFOL 10 MG/ML IV BOLUS
INTRAVENOUS | Status: DC | PRN
  Administered 2012-10-01: 100 mg via INTRAVENOUS
  Administered 2012-10-01 (×2): 50 mg via INTRAVENOUS

## 2012-10-01 MED ORDER — 0.9 % SODIUM CHLORIDE (POUR BTL) OPTIME
TOPICAL | Status: DC | PRN
  Administered 2012-10-01: 6000 mL

## 2012-10-01 MED ORDER — LACTATED RINGERS IV SOLN
INTRAVENOUS | Status: DC | PRN
  Administered 2012-10-01 (×2): via INTRAVENOUS

## 2012-10-01 MED ORDER — SODIUM CHLORIDE 0.9 % IV SOLN
INTRAVENOUS | Status: DC | PRN
  Administered 2012-10-01 (×3): via INTRAVENOUS

## 2012-10-01 MED ORDER — ALBUMIN HUMAN 5 % IV SOLN
INTRAVENOUS | Status: DC | PRN
  Administered 2012-10-01: 13:00:00 via INTRAVENOUS

## 2012-10-01 MED ORDER — INSULIN ASPART 100 UNIT/ML ~~LOC~~ SOLN
0.0000 [IU] | SUBCUTANEOUS | Status: DC
  Administered 2012-10-01 – 2012-10-02 (×2): 4 [IU] via SUBCUTANEOUS
  Administered 2012-10-02: 2 [IU] via SUBCUTANEOUS

## 2012-10-01 MED ORDER — ALBUMIN HUMAN 5 % IV SOLN
250.0000 mL | INTRAVENOUS | Status: AC | PRN
  Administered 2012-10-01 (×2): 250 mL via INTRAVENOUS

## 2012-10-01 MED ORDER — LACTATED RINGERS IV SOLN
INTRAVENOUS | Status: DC | PRN
  Administered 2012-10-01 (×2): via INTRAVENOUS

## 2012-10-01 MED ORDER — SODIUM CHLORIDE 0.45 % IV SOLN
INTRAVENOUS | Status: DC
  Administered 2012-10-01: 20 mL via INTRAVENOUS

## 2012-10-01 MED ORDER — SODIUM CHLORIDE 0.9 % IV SOLN
250.0000 mL | INTRAVENOUS | Status: DC
  Administered 2012-10-02: 250 mL via INTRAVENOUS

## 2012-10-01 MED ORDER — DEXTROSE 5 % IV SOLN
0.2500 ug/kg/min | Freq: Once | INTRAVENOUS | Status: DC
  Filled 2012-10-01: qty 2

## 2012-10-01 MED ORDER — MIDAZOLAM HCL 2 MG/2ML IJ SOLN
2.0000 mg | INTRAMUSCULAR | Status: DC | PRN
  Filled 2012-10-01: qty 2

## 2012-10-01 MED ORDER — METOPROLOL TARTRATE 25 MG/10 ML ORAL SUSPENSION
12.5000 mg | Freq: Two times a day (BID) | ORAL | Status: DC
  Administered 2012-10-06: 12.5 mg
  Filled 2012-10-01 (×13): qty 5

## 2012-10-01 MED ORDER — FENTANYL CITRATE 0.05 MG/ML IJ SOLN
INTRAMUSCULAR | Status: DC | PRN
  Administered 2012-10-01: 250 ug via INTRAVENOUS
  Administered 2012-10-01: 50 ug via INTRAVENOUS
  Administered 2012-10-01: 25 ug via INTRAVENOUS
  Administered 2012-10-01: 100 ug via INTRAVENOUS
  Administered 2012-10-01: 500 ug via INTRAVENOUS
  Administered 2012-10-01: 75 ug via INTRAVENOUS

## 2012-10-01 MED ORDER — ASPIRIN EC 325 MG PO TBEC
325.0000 mg | DELAYED_RELEASE_TABLET | Freq: Every day | ORAL | Status: DC
  Administered 2012-10-02 – 2012-10-07 (×6): 325 mg via ORAL
  Filled 2012-10-01 (×6): qty 1

## 2012-10-01 MED ORDER — LACTATED RINGERS IV SOLN
INTRAVENOUS | Status: DC
  Administered 2012-10-01: 60 mL via INTRAVENOUS

## 2012-10-01 MED ORDER — HEPARIN SODIUM (PORCINE) 1000 UNIT/ML IJ SOLN
INTRAMUSCULAR | Status: DC | PRN
  Administered 2012-10-01: 26000 [IU] via INTRAVENOUS

## 2012-10-01 MED ORDER — DEXMEDETOMIDINE HCL IN NACL 200 MCG/50ML IV SOLN: 0.1000 ug/kg/h | INTRAVENOUS | Status: DC

## 2012-10-01 MED ORDER — POTASSIUM CHLORIDE 10 MEQ/50ML IV SOLN: 10.0000 meq | INTRAVENOUS | Status: AC

## 2012-10-01 MED ORDER — HEMOSTATIC AGENTS (NO CHARGE) OPTIME
TOPICAL | Status: DC | PRN
  Administered 2012-10-01: 1 via TOPICAL

## 2012-10-01 MED ORDER — INSULIN REGULAR BOLUS VIA INFUSION
0.0000 [IU] | Freq: Three times a day (TID) | INTRAVENOUS | Status: DC
Start: 2012-10-01 — End: 2012-10-02
  Filled 2012-10-01: qty 10

## 2012-10-01 MED ORDER — PLASMA-LYTE 148 IV SOLN
INTRAVENOUS | Status: DC
  Filled 2012-10-01: qty 2.5

## 2012-10-01 MED ORDER — PHENYLEPHRINE HCL 10 MG/ML IJ SOLN
20.0000 mg | INTRAVENOUS | Status: DC | PRN
  Administered 2012-10-01: 50 ug/min via INTRAVENOUS

## 2012-10-01 MED ORDER — OXYCODONE HCL 5 MG PO TABS
5.0000 mg | ORAL_TABLET | ORAL | Status: DC | PRN
  Administered 2012-10-02 – 2012-10-06 (×2): 10 mg via ORAL
  Filled 2012-10-01: qty 2
  Filled 2012-10-01: qty 1
  Filled 2012-10-01: qty 2

## 2012-10-01 SURGICAL SUPPLY — 108 items
ATTRACTOMAT 16X20 MAGNETIC DRP (DRAPES) ×3 IMPLANT
BAG DECANTER FOR FLEXI CONT (MISCELLANEOUS) ×3 IMPLANT
BANDAGE ELASTIC 4 VELCRO ST LF (GAUZE/BANDAGES/DRESSINGS) ×6 IMPLANT
BANDAGE ELASTIC 6 VELCRO ST LF (GAUZE/BANDAGES/DRESSINGS) ×6 IMPLANT
BANDAGE GAUZE ELAST BULKY 4 IN (GAUZE/BANDAGES/DRESSINGS) ×6 IMPLANT
BLADE STERNUM SYSTEM 6 (BLADE) ×3 IMPLANT
BLADE SURG ROTATE 9660 (MISCELLANEOUS) IMPLANT
CANISTER SUCTION 2500CC (MISCELLANEOUS) ×3 IMPLANT
CANN PRFSN .5XCNCT 15X34-48 (MISCELLANEOUS) ×2
CANNULA AORTIC HI-FLOW 6.5M20F (CANNULA) ×3 IMPLANT
CANNULA PRFSN .5XCNCT 15X34-48 (MISCELLANEOUS) ×2 IMPLANT
CANNULA VEN 2 STAGE (MISCELLANEOUS) ×4 IMPLANT
CATH CPB KIT GERHARDT (MISCELLANEOUS) ×3 IMPLANT
CATH THORACIC 28FR (CATHETERS) ×3 IMPLANT
CATH THORACIC 36FR (CATHETERS) IMPLANT
CATH THORACIC 36FR RT ANG (CATHETERS) IMPLANT
CLIP TI MEDIUM 24 (CLIP) IMPLANT
CLIP TI WIDE RED SMALL 24 (CLIP) IMPLANT
CLOTH BEACON ORANGE TIMEOUT ST (SAFETY) ×3 IMPLANT
COVER SURGICAL LIGHT HANDLE (MISCELLANEOUS) ×3 IMPLANT
CRADLE DONUT ADULT HEAD (MISCELLANEOUS) ×3 IMPLANT
DRAIN CHANNEL 28F RND 3/8 FF (WOUND CARE) ×3 IMPLANT
DRAPE CARDIOVASCULAR INCISE (DRAPES) ×1
DRAPE SLUSH/WARMER DISC (DRAPES) ×3 IMPLANT
DRAPE SRG 135X102X78XABS (DRAPES) ×2 IMPLANT
DRSG COVADERM 4X14 (GAUZE/BANDAGES/DRESSINGS) ×3 IMPLANT
ELECT BLADE 4.0 EZ CLEAN MEGAD (MISCELLANEOUS) ×3
ELECT CAUTERY BLADE 6.4 (BLADE) ×3 IMPLANT
ELECT REM PT RETURN 9FT ADLT (ELECTROSURGICAL) ×6
ELECTRODE BLDE 4.0 EZ CLN MEGD (MISCELLANEOUS) ×2 IMPLANT
ELECTRODE REM PT RTRN 9FT ADLT (ELECTROSURGICAL) ×4 IMPLANT
GLOVE BIO SURGEON STRL SZ 6 (GLOVE) ×9 IMPLANT
GLOVE BIO SURGEON STRL SZ 6.5 (GLOVE) ×12 IMPLANT
GLOVE BIO SURGEON STRL SZ7.5 (GLOVE) ×6 IMPLANT
GLOVE BIO SURGEON STRL SZ8 (GLOVE) ×3 IMPLANT
GLOVE BIOGEL PI IND STRL 6 (GLOVE) IMPLANT
GLOVE BIOGEL PI IND STRL 6.5 (GLOVE) ×4 IMPLANT
GLOVE BIOGEL PI IND STRL 7.0 (GLOVE) ×2 IMPLANT
GLOVE BIOGEL PI INDICATOR 6 (GLOVE)
GLOVE BIOGEL PI INDICATOR 6.5 (GLOVE) ×2
GLOVE BIOGEL PI INDICATOR 7.0 (GLOVE) ×1
GOWN EXTRA PROTECTION XL (GOWNS) ×3 IMPLANT
GOWN STRL NON-REIN LRG LVL3 (GOWN DISPOSABLE) ×18 IMPLANT
HEMOSTAT POWDER SURGIFOAM 1G (HEMOSTASIS) ×9 IMPLANT
HEMOSTAT SURGICEL 2X14 (HEMOSTASIS) ×3 IMPLANT
INSERT FOGARTY 61MM (MISCELLANEOUS) IMPLANT
INSERT FOGARTY XLG (MISCELLANEOUS) IMPLANT
KIT BASIN OR (CUSTOM PROCEDURE TRAY) ×3 IMPLANT
KIT ROOM TURNOVER OR (KITS) ×3 IMPLANT
KIT SUCTION CATH 14FR (SUCTIONS) ×6 IMPLANT
KIT VASOVIEW W/TROCAR VH 2000 (KITS) ×3 IMPLANT
LEAD PACING MYOCARDI (MISCELLANEOUS) ×3 IMPLANT
MARKER GRAFT CORONARY BYPASS (MISCELLANEOUS) ×9 IMPLANT
NS IRRIG 1000ML POUR BTL (IV SOLUTION) ×18 IMPLANT
PACK OPEN HEART (CUSTOM PROCEDURE TRAY) ×3 IMPLANT
PAD ARMBOARD 7.5X6 YLW CONV (MISCELLANEOUS) ×6 IMPLANT
PENCIL BUTTON HOLSTER BLD 10FT (ELECTRODE) ×3 IMPLANT
PUNCH AORTIC ROTATE 4.0MM (MISCELLANEOUS) IMPLANT
PUNCH AORTIC ROTATE 4.5MM 8IN (MISCELLANEOUS) ×3 IMPLANT
PUNCH AORTIC ROTATE 5MM 8IN (MISCELLANEOUS) IMPLANT
SET CARDIOPLEGIA MPS 5001102 (MISCELLANEOUS) ×3 IMPLANT
SOLUTION ANTI FOG 6CC (MISCELLANEOUS) IMPLANT
SPONGE GAUZE 4X4 12PLY (GAUZE/BANDAGES/DRESSINGS) ×12 IMPLANT
SPONGE LAP 18X18 X RAY DECT (DISPOSABLE) ×3 IMPLANT
SPONGE LAP 4X18 X RAY DECT (DISPOSABLE) IMPLANT
SUT BONE WAX W31G (SUTURE) ×3 IMPLANT
SUT MNCRL AB 4-0 PS2 18 (SUTURE) IMPLANT
SUT PROLENE 3 0 SH DA (SUTURE) IMPLANT
SUT PROLENE 3 0 SH1 36 (SUTURE) ×3 IMPLANT
SUT PROLENE 4 0 RB 1 (SUTURE)
SUT PROLENE 4 0 SH DA (SUTURE) IMPLANT
SUT PROLENE 4 0 TF (SUTURE) ×6 IMPLANT
SUT PROLENE 4-0 RB1 .5 CRCL 36 (SUTURE) IMPLANT
SUT PROLENE 5 0 C 1 36 (SUTURE) IMPLANT
SUT PROLENE 6 0 C 1 30 (SUTURE) ×3 IMPLANT
SUT PROLENE 6 0 CC (SUTURE) ×15 IMPLANT
SUT PROLENE 7 0 BV 1 (SUTURE) IMPLANT
SUT PROLENE 7 0 BV1 MDA (SUTURE) ×6 IMPLANT
SUT PROLENE 7.0 RB 3 (SUTURE) IMPLANT
SUT PROLENE 8 0 BV175 6 (SUTURE) ×15 IMPLANT
SUT SILK  1 MH (SUTURE)
SUT SILK 1 MH (SUTURE) IMPLANT
SUT SILK 2 0 SH CR/8 (SUTURE) IMPLANT
SUT SILK 3 0 SH CR/8 (SUTURE) IMPLANT
SUT STEEL 6MS V (SUTURE) ×3 IMPLANT
SUT STEEL STERNAL CCS#1 18IN (SUTURE) ×3 IMPLANT
SUT STEEL SZ 6 DBL 3X14 BALL (SUTURE) ×3 IMPLANT
SUT VIC AB 1 CTX 18 (SUTURE) ×6 IMPLANT
SUT VIC AB 1 CTX 36 (SUTURE)
SUT VIC AB 1 CTX36XBRD ANBCTR (SUTURE) IMPLANT
SUT VIC AB 2-0 CT1 27 (SUTURE) ×2
SUT VIC AB 2-0 CT1 TAPERPNT 27 (SUTURE) ×4 IMPLANT
SUT VIC AB 2-0 CTX 27 (SUTURE) IMPLANT
SUT VIC AB 3-0 SH 27 (SUTURE)
SUT VIC AB 3-0 SH 27X BRD (SUTURE) IMPLANT
SUT VIC AB 3-0 X1 27 (SUTURE) ×6 IMPLANT
SUT VICRYL 4-0 PS2 18IN ABS (SUTURE) IMPLANT
SUTURE E-PAK OPEN HEART (SUTURE) ×3 IMPLANT
SYSTEM SAHARA CHEST DRAIN ATS (WOUND CARE) ×3 IMPLANT
TAPE CLOTH SURG 4X10 WHT LF (GAUZE/BANDAGES/DRESSINGS) ×3 IMPLANT
TOWEL OR 17X24 6PK STRL BLUE (TOWEL DISPOSABLE) ×6 IMPLANT
TOWEL OR 17X26 10 PK STRL BLUE (TOWEL DISPOSABLE) ×6 IMPLANT
TRAY FOLEY IC TEMP SENS 14FR (CATHETERS) ×3 IMPLANT
TUBE FEEDING 8FR 16IN STR KANG (MISCELLANEOUS) ×3 IMPLANT
TUBE SUCT INTRACARD DLP 20F (MISCELLANEOUS) ×3 IMPLANT
TUBING INSUFFLATION 10FT LAP (TUBING) ×3 IMPLANT
UNDERPAD 30X30 INCONTINENT (UNDERPADS AND DIAPERS) ×3 IMPLANT
WATER STERILE IRR 1000ML POUR (IV SOLUTION) ×6 IMPLANT

## 2012-10-01 NOTE — Anesthesia Preprocedure Evaluation (Addendum)
Anesthesia Evaluation  Patient identified by MRN, date of birth, ID band Patient awake    Reviewed: Allergy & Precautions, H&P , NPO status , Patient's Chart, lab work & pertinent test results  Airway Mallampati: I TM Distance: >3 FB Neck ROM: full    Dental  (+) Teeth Intact, Dental Advisory Given and Caps   Pulmonary neg pneumonia -,  Results for NYJAH, SCHWAKE (MRN [id number]) as of 10/01/2012 05:57  09/30/2012 18:00 Sample type: ARTERIAL DRAW FIO2: 0.21 pH, Arterial: 7.453 (H) pCO2 arterial: 33.8 (L) pO2, Arterial: 85.8 Bicarbonate: 23.3 TCO2: 24.3 Acid-base deficit: 0.2 O2 Saturation: 98.0 Patient temperature: 98.6 Collection site: LEFT RADIAL Allens test (pass/fail): PASS    Pulmonary exam normal       Cardiovascular Exercise Tolerance: Poor hypertension, Pt. on medications + angina with exertion + CAD + Valvular Problems/Murmurs Rhythm:regular Rate:Normal    - No significant extracranial carotid artery stenosis   demonstrated. Vertebrals are patent with antegrade flow. - Due to Technical difficulty, lower extremity pressures may   be falsely lower. Prepared and Electronically Authenticated by  09-28-12     Neuro/Psych  Headaches, Anxiety    GI/Hepatic GERD-  Controlled,  Endo/Other    Renal/GU      Musculoskeletal  (+) Arthritis -, Osteoarthritis,    Abdominal   Peds  Hematology negative hematology ROS (+)   Anesthesia Other Findings Full upper crowns.  Dental advisory given, pt. Accepts risks.  Reproductive/Obstetrics                       Anesthesia Physical Anesthesia Plan  ASA: III  Anesthesia Plan: General   Post-op Pain Management:    Induction: Intravenous  Airway Management Planned: Oral ETT  Additional Equipment: Arterial line, CVP and PA Cath  Intra-op Plan:   Post-operative Plan: Post-operative intubation/ventilation  Informed Consent: I have  reviewed the patients History and Physical, chart, labs and discussed the procedure including the risks, benefits and alternatives for the proposed anesthesia with the patient or authorized representative who has indicated his/her understanding and acceptance.   Dental advisory given  Plan Discussed with: CRNA, Anesthesiologist and Surgeon  Anesthesia Plan Comments:       Anesthesia Quick Evaluation

## 2012-10-01 NOTE — Brief Op Note (Addendum)
                   [address]            [name] [address]          [phone]    09/28/2012 - 10/01/2012  11:41 AM  PATIENT:  [place] Scott  78 y.o. female  PRE-OPERATIVE DIAGNOSIS:  CAD  POST-OPERATIVE DIAGNOSIS:  Coronary Artery Disease  PROCEDURE:  Procedure(s): CORONARY ARTERY BYPASS GRAFTING (CABG)X 5 LIMA-LAD; SVG-DIAG; SVG-OM1; SEQSVG-PD-PL EVH- BILAT THIGHS   SURGEON:  Surgeon(s): Delight Ovens, MD  PHYSICIAN ASSISTANT: WAYNE GOLD PA-C  ANESTHESIA:   general  PATIENT CONDITION:  ICU - intubated and hemodynamically stable.  PRE-OPERATIVE WEIGHT: 79kg  COMPLICATIONS: NO KNOWN     Delight Ovens MD  Beeper [phone] Office [phone] 10/01/2012 2:39 PM

## 2012-10-01 NOTE — Anesthesia Postprocedure Evaluation (Signed)
  Anesthesia Post-op Note  Patient: [place] Scott  Procedure(s) Performed: Procedure(s) (LRB) with comments: CORONARY ARTERY BYPASS GRAFTING (CABG) (N/A) - times five using Left Internal Mammary Artery  ENDOVEIN HARVEST OF GREATER SAPHENOUS VEIN (Bilateral)  Patient Location: SICU  Anesthesia Type:General  Level of Consciousness: sedated and Patient remains intubated per anesthesia plan  Airway and Oxygen Therapy: Patient remains intubated per anesthesia plan and Patient placed on Ventilator (see vital sign flow sheet for setting)  Post-op Pain: none  Post-op Assessment: Post-op Vital signs reviewed, Patient's Cardiovascular Status Stable, Respiratory Function Stable, Patent Airway, No signs of Nausea or vomiting and Pain level controlled  Post-op Vital Signs: stable  Complications: No apparent anesthesia complications

## 2012-10-01 NOTE — Progress Notes (Signed)
Patient placed back to a rate of 10 after attempt to wean.

## 2012-10-01 NOTE — OR Nursing (Signed)
2300 called at 1303 to notify for 20 minute call.

## 2012-10-01 NOTE — OR Nursing (Signed)
2300 called at 1346 for on the way call.

## 2012-10-01 NOTE — Preoperative (Signed)
Beta Blockers   Reason not to administer Beta Blockers:Not Applicable 

## 2012-10-01 NOTE — Progress Notes (Signed)
Patient ID: Kimberly Scott, female   DOB: [birth year], 78 y.o.   MRN: [id number]   SICU Evening Rounds:  Hemodynamically stable on no inotropes.  CI = 1.5  Urine output excellent  Still on vent. Waking up.  CT output low.  6 hr labs pending  A/P: stable. Wean vent as tolerated.

## 2012-10-01 NOTE — Anesthesia Procedure Notes (Signed)
Procedure Name: Intubation Date/Time: 10/01/2012 7:35 AM Performed by: Tyrone Nine Pre-anesthesia Checklist: Patient being monitored, Suction available, Emergency Drugs available, Patient identified and Timeout performed Patient Re-evaluated:Patient Re-evaluated prior to inductionOxygen Delivery Method: Circle system utilized Preoxygenation: Pre-oxygenation with 100% oxygen Intubation Type: IV induction Ventilation: Mask ventilation without difficulty Laryngoscope Size: Mac and 3 Grade View: Grade II Number of attempts: 2 Airway Equipment and Method: Bougie stylet Placement Confirmation: ETT inserted through vocal cords under direct vision,  positive ETCO2,  CO2 detector and breath sounds checked- equal and bilateral Secured at: 22 cm Tube secured with: Tape Dental Injury: Teeth and Oropharynx as per pre-operative assessment

## 2012-10-01 NOTE — OR Nursing (Signed)
Volunteer desk called at 1245 to notify family patient was off pump and procedure was closing.

## 2012-10-01 NOTE — OR Nursing (Signed)
Surgical Waiting Volunteer desk called at [phone] to notify family of surgery start.

## 2012-10-01 NOTE — Procedures (Signed)
Extubation Procedure Note  Patient Details:   Name: Kimber[place] Scott DOB: Aug 22, 1928 MRN: [id number]   Airway Documentation:   Patient extubated to 4 lpm nasal cannula.  VC 500 ml, NIf -30, able to hold head off bed 10 seconds.  Patient able to breathe around deflated cuff and vocalize post procedure.  Tolerated well, no complications.    Evaluation  O2 sats: stable throughout Complications: No apparent complications Patient did tolerate procedure well. Bilateral Breath Sounds: Clear   Yes  Lamoyne Hessel, Aloha Gell 10/01/2012, 10:18 PM

## 2012-10-01 NOTE — Transfer of Care (Signed)
Immediate Anesthesia Transfer of Care Note  Patient: Kimberly Scott  Procedure(s) Performed: Procedure(s) (LRB) with comments: CORONARY ARTERY BYPASS GRAFTING (CABG) (N/A) - times five using Left Internal Mammary Artery  ENDOVEIN HARVEST OF GREATER SAPHENOUS VEIN (Bilateral)  Patient Location: PACU and SICU  Anesthesia Type:General  Level of Consciousness: unresponsive  Airway & Oxygen Therapy: Patient remains intubated per anesthesia plan and Patient placed on Ventilator (see vital sign flow sheet for setting)  Post-op Assessment: Report given to PACU RN and Post -op Vital signs reviewed and stable  Post vital signs: Reviewed and stable  Complications: No apparent anesthesia complications

## 2012-10-02 ENCOUNTER — Inpatient Hospital Stay (HOSPITAL_COMMUNITY): Payer: PRIVATE HEALTH INSURANCE

## 2012-10-02 ENCOUNTER — Encounter (HOSPITAL_COMMUNITY): Payer: Self-pay | Admitting: Cardiothoracic Surgery

## 2012-10-02 LAB — CBC
HCT: 31.5 % — ABNORMAL LOW (ref 36.0–46.0)
HCT: 30.5 % — ABNORMAL LOW (ref 36.0–46.0)
Hemoglobin: 10.7 g/dL — ABNORMAL LOW (ref 12.0–15.0)
Hemoglobin: 10.2 g/dL — ABNORMAL LOW (ref 12.0–15.0)
MCH: 30.4 pg (ref 26.0–34.0)
MCHC: 33.4 g/dL (ref 30.0–36.0)
MCV: 90.8 fL (ref 78.0–100.0)
RBC: 3.52 MIL/uL — ABNORMAL LOW (ref 3.87–5.11)
RDW: 13.1 % (ref 11.5–15.5)
WBC: 8.8 10*3/uL (ref 4.0–10.5)

## 2012-10-02 LAB — BASIC METABOLIC PANEL
BUN: 8 mg/dL (ref 6–23)
Chloride: 104 mEq/L (ref 96–112)
GFR calc non Af Amer: 77 mL/min — ABNORMAL LOW (ref >90)
Glucose, Bld: 133 mg/dL — ABNORMAL HIGH (ref 70–99)
Potassium: 4 mEq/L (ref 3.5–5.1)
Sodium: 136 mEq/L (ref 135–145)

## 2012-10-02 LAB — GLUCOSE, CAPILLARY
Glucose-Capillary: 129 mg/dL — ABNORMAL HIGH (ref 70–99)
Glucose-Capillary: 153 mg/dL — ABNORMAL HIGH (ref 70–99)
Glucose-Capillary: 158 mg/dL — ABNORMAL HIGH (ref 70–99)
Glucose-Capillary: 167 mg/dL — ABNORMAL HIGH (ref 70–99)
Glucose-Capillary: 170 mg/dL — ABNORMAL HIGH (ref 70–99)

## 2012-10-02 LAB — POCT I-STAT, CHEM 8
BUN: 9 mg/dL (ref 6–23)
Calcium, Ion: 0.99 mmol/L — ABNORMAL LOW (ref 1.13–1.30)
Chloride: 97 mEq/L (ref 96–112)
Creatinine, Ser: 0.9 mg/dL (ref 0.50–1.10)
Glucose, Bld: 160 mg/dL — ABNORMAL HIGH (ref 70–99)
HCT: 30 % — ABNORMAL LOW (ref 36.0–46.0)
Hemoglobin: 10.2 g/dL — ABNORMAL LOW (ref 12.0–15.0)
Potassium: 4.1 mEq/L (ref 3.5–5.1)
Sodium: 132 mEq/L — ABNORMAL LOW (ref 135–145)
TCO2: 22 mmol/L (ref 0–100)

## 2012-10-02 MED ORDER — FUROSEMIDE 10 MG/ML IJ SOLN
40.0000 mg | Freq: Once | INTRAMUSCULAR | Status: AC
  Administered 2012-10-02: 40 mg via INTRAVENOUS

## 2012-10-02 MED ORDER — POTASSIUM CHLORIDE CRYS ER 20 MEQ PO TBCR
20.0000 meq | EXTENDED_RELEASE_TABLET | Freq: Once | ORAL | Status: AC
  Administered 2012-10-02: 20 meq via ORAL
  Filled 2012-10-02: qty 1

## 2012-10-02 MED ORDER — INSULIN ASPART 100 UNIT/ML ~~LOC~~ SOLN
0.0000 [IU] | SUBCUTANEOUS | Status: DC
  Administered 2012-10-02: 2 [IU] via SUBCUTANEOUS
  Administered 2012-10-02: 4 [IU] via SUBCUTANEOUS

## 2012-10-02 MED ORDER — TRAMADOL HCL 50 MG PO TABS
50.0000 mg | ORAL_TABLET | Freq: Four times a day (QID) | ORAL | Status: DC | PRN
  Administered 2012-10-02 – 2012-10-03 (×3): 50 mg via ORAL
  Filled 2012-10-02 (×3): qty 1

## 2012-10-02 MED ORDER — INSULIN ASPART 100 UNIT/ML ~~LOC~~ SOLN
0.0000 [IU] | Freq: Four times a day (QID) | SUBCUTANEOUS | Status: DC
  Administered 2012-10-02 – 2012-10-03 (×5): 2 [IU] via SUBCUTANEOUS

## 2012-10-02 MED FILL — Magnesium Sulfate Inj 50%: INTRAMUSCULAR | Qty: 10 | Status: AC

## 2012-10-02 MED FILL — Potassium Chloride Inj 2 mEq/ML: INTRAVENOUS | Qty: 40 | Status: AC

## 2012-10-02 NOTE — Progress Notes (Addendum)
TCTS DAILY PROGRESS NOTE                   [address]            [place] [address]          [phone]      1 Day Post-Op Procedure(s) (LRB): CORONARY ARTERY BYPASS GRAFTING (CABG) (N/A) ENDOVEIN HARVEST OF GREATER SAPHENOUS VEIN (Bilateral)  Total Length of Stay:  LOS: 4 days   Subjective: Some sternal incisional pain   Objective: Vital signs in last 24 hours: Temp:  [96.1 F (35.6 C)-98.8 F (37.1 C)] 98.6 F (37 C) (11/05 0700) Pulse Rate:  [80-89] 80  (11/05 0700) Cardiac Rhythm:  [-] Atrial paced (11/05 0400) Resp:  [12-23] 20  (11/05 0700) BP: (101-134)/(54-74) 134/59 mmHg (11/05 0700) SpO2:  [94 %-98 %] 95 % (11/05 0700) Arterial Line BP: (89-167)/(52-83) 141/52 mmHg (11/05 0700) FiO2 (%):  [40 %-50 %] 40 % (11/04 2125) Weight:  [188 lb 4.4 oz (85.4 kg)] 188 lb 4.4 oz (85.4 kg) (11/05 0600)  Filed Weights   09/29/12 0541 10/01/12 0450 10/02/12 0600  Weight: 177 lb 0.5 oz (80.3 kg) 175 lb 9.6 oz (79.652 kg) 188 lb 4.4 oz (85.4 kg)    Weight change: 12 lb 10.8 oz (5.748 kg)   Hemodynamic parameters for last 24 hours: PAP: (27-40)/(13-32) 30/13 mmHg CO:  [2.3 L/min-4.3 L/min] 4.3 L/min CI:  [1.3 L/min/m2-2.4 L/min/m2] 2.4 L/min/m2  Intake/Output from previous day: 11/04 0701 - 11/05 0700 In: 7449.1 [P.O.:240; I.V.:5199.1; Blood:735; IV Piggyback:1275] Out: 0102 [VOZDG:6440; Chest Tube:520]  Intake/Output this shift:    Current Meds: Scheduled Meds:   . [COMPLETED] acetaminophen  1,000 mg Intravenous Once  . acetaminophen  1,000 mg Oral Q6H   Or  . acetaminophen (TYLENOL) oral liquid 160 mg/5 mL  975 mg Per Tube Q6H  . [COMPLETED] aminocaproic acid (AMICAR) for OHS   Intravenous To OR  . aspirin EC  325 mg Oral Daily   Or  . aspirin  324 mg Per Tube Daily  . atorvastatin  20 mg Oral q1800  . bisacodyl  10 mg Oral Daily   Or  . bisacodyl  10 mg Rectal Daily  . [COMPLETED] cefUROXime (ZINACEF)  IV  1.5 g Intravenous To OR  .  cefUROXime (ZINACEF)  IV  1.5 g Intravenous Q12H  . [COMPLETED] dexmedetomidine  0.1-0.7 mcg/kg/hr Intravenous To OR  . docusate sodium  200 mg Oral Daily  . [COMPLETED] famotidine (PEPCID) IV  20 mg Intravenous Q12H  . [COMPLETED] heparin-papaverine-plasmalyte irrigation   Irrigation To OR  . insulin aspart  0-24 Units Subcutaneous Q4H  . [COMPLETED] insulin (NOVOLIN-R) infusion   Intravenous To OR  . insulin regular  0-10 Units Intravenous TID WC  . [COMPLETED] magnesium sulfate  4 g Intravenous Once  . metoprolol tartrate  12.5 mg Oral BID   Or  . metoprolol tartrate  12.5 mg Per Tube BID  . pantoprazole  40 mg Oral Daily  . [EXPIRED] potassium chloride  10 mEq Intravenous Q1 Hr x 3  . sodium chloride  3 mL Intravenous Q12H  . [COMPLETED] vancomycin  1,250 mg Intravenous To OR  . [COMPLETED] vancomycin  1,000 mg Intravenous Once  . [DISCONTINUED] aspirin EC  81 mg Oral Daily  . [DISCONTINUED] atorvastatin  20 mg Oral q1800  . [DISCONTINUED] cefUROXime (ZINACEF)  IV  750 mg Intravenous To OR  . [DISCONTINUED] DOPamine  2-20 mcg/kg/min Intravenous To  OR  . [DISCONTINUED] epinephrine  0.5-20 mcg/min Intravenous To OR  . [DISCONTINUED] heparin-papaverine-plasmalyte irrigation   Irrigation To OR  . [DISCONTINUED] magnesium sulfate  40 mEq Other To OR  . [DISCONTINUED] metoprolol tartrate  12.5 mg Oral BID  . [DISCONTINUED] nitroGLYCERIN  1 inch Topical Q6H  . [DISCONTINUED] nitroGLYCERIN  2-200 mcg/min Intravenous To OR  . [DISCONTINUED] nitroPRUSSide  0.25-0.5 mcg/kg/min Intravenous Once  . [DISCONTINUED] pantoprazole  40 mg Oral Daily  . [DISCONTINUED] phenylephrine (NEO-SYNEPHRINE) Adult infusion  30-200 mcg/min Intravenous To OR  . [DISCONTINUED] potassium chloride  80 mEq Other To OR  . [DISCONTINUED] sodium chloride  3 mL Intravenous Q12H   Continuous Infusions:   . sodium chloride 20 mL (10/01/12 1430)  . sodium chloride 20 mL (10/01/12 1400)  . sodium chloride 250 mL  (10/02/12 0546)  . dexmedetomidine Stopped (10/01/12 1615)  . insulin (NOVOLIN-R) infusion Stopped (10/01/12 1700)  . lactated ringers 60 mL (10/01/12 1400)  . nitroGLYCERIN 40 mcg/min (10/02/12 0700)  . phenylephrine (NEO-SYNEPHRINE) Adult infusion     PRN Meds:.albumin human, [EXPIRED] lactated ringers, metoprolol, midazolam, [EXPIRED]  morphine injection, morphine injection, ondansetron (ZOFRAN) IV, oxyCODONE, sodium chloride, [DISCONTINUED] sodium chloride, [DISCONTINUED] 0.9 % irrigation (POUR BTL), [DISCONTINUED] acetaminophen, [DISCONTINUED] alum & mag hydroxide-simeth, [DISCONTINUED] docusate sodium, [DISCONTINUED] guaiFENesin-dextromethorphan, [DISCONTINUED] hemostatic agents [DISCONTINUED] loperamide, [DISCONTINUED] nitroGLYCERIN, [DISCONTINUED] ondansetron (ZOFRAN) IV, [DISCONTINUED] pramoxine-mineral oil-zinc, [DISCONTINUED] sodium chloride, [DISCONTINUED] Surgifoam 1 Gm with 0.9% sodium chloride (4 ml) topical solution  General appearance: alert, cooperative and no distress Neurologic: intact Heart: regular rate and rhythm Lungs: dim in bases Abdomen: soft, +BS, nontender Extremities: + BLE edema Wound: dressings CDI  Lab Results: CBC: Basename 10/02/12 0356 10/01/12 2007 10/01/12 2000  WBC 8.8 -- 8.5  HGB 10.7* 10.5* --  HCT 31.5* 31.0* --  PLT 166 -- 161   BMET:  Basename 10/02/12 0356 10/01/12 2007 10/01/12 0539  NA 136 136 --  K 4.0 4.6 --  CL 104 103 --  CO2 23 -- 23  GLUCOSE 133* 165* --  BUN 8 7 --  CREATININE 0.74 0.80 --  CALCIUM 7.3* -- 8.8    PT/INR:  Basename 10/01/12 1400  LABPROT 17.6*  INR 1.49   Radiology: Dg Chest Portable 1 View  10/01/2012  *RADIOLOGY REPORT*  Clinical Data: Postop day 0 status post CABG.  PORTABLE CHEST - 1 VIEW  Comparison: 09/30/2012  Findings: Endotracheal tube tip is 2.5 cm above the carina. Nasogastric tube enters the stomach.  Right internal jugular Swan- Ganz catheter tip projects in the proximal portion of the main  pulmonary artery.  Epicardial pacer leads, left chest tube, and mediastinal drain noted.  No discrete pneumothorax observed.  There is likely mild pneumomediastinum.  Left lower lobe airspace opacity favors atelectasis.  There is mild airspace opacity in the right upper lobe.  Borderline cardiomegaly noted with pulmonary venous hypertension and borderline interstitial edema.  Atherosclerotic calcification of the aortic arch is present.  IMPRESSION:  1.  Postop day 0 status post CABG.  Tubes and lines appear satisfactorily positioned although the Swan-Ganz catheter tip is somewhat low/proximal in the main pulmonary artery. 2.  Lucency along the right side of the mediastinum may reflect a tiny amount of pneumomediastinum, not unexpected status post CABG. 3.  Airspace opacity in the left lower lobe and right upper lobe, potentially from atelectasis or confluent edema. 4.  Pulmonary venous hypertension and borderline interstitial edema, with borderline cardiomegaly.   Original Report Authenticated By: Gaylyn Rong, M.D.  Dg Chest Port 1 View  09/30/2012  *RADIOLOGY REPORT*  Clinical Data: Preoperative evaluation, coronary disease  PORTABLE CHEST - 1 VIEW  Comparison: None.  Findings: Normal heart size with slight vascular prominence. Negative for CHF, pneumonia, collapse, consolidation, effusion or pneumothorax.  Trachea midline.  Atherosclerosis of the aorta.  IMPRESSION: No acute chest finding   Original Report Authenticated By: Judie Petit. Miles Costain, M.D.      Assessment/Plan: S/P Procedure(s) (LRB): CORONARY ARTERY BYPASS GRAFTING (CABG) (N/A) ENDOVEIN HARVEST OF GREATER SAPHENOUS VEIN (Bilateral)   1. Doing well 2 bp elevated, otherwise hemodynamically stable off inotropic support, wean NITRO as able 3 d/c tubes 4 pulm toilet 5 diurese 6 Exp ABL anemia- mild, monitor 7 renal fxn normal 8 cbg's adequate control 9 d/c sganz then aline when nitro off 10 apaced currently    Kimberly Scott,Kimberly Scott 10/02/2012  7:19 AM   Expected Acute  Blood - loss Anemia Stable post op I have seen and examined Gilford Silvius and agree with the above assessment  and plan.  Delight Ovens MD Beeper [phone] Office [phone] 10/02/2012 8:24 AM

## 2012-10-02 NOTE — Care Management Note (Signed)
    Page 1 of 1   10/04/2012     2:01:19 PM   CARE MANAGEMENT NOTE 10/04/2012  Patient:  KENNEDEY, DIGILIO   Account Number:  [id number]  Date Initiated:  10/02/2012  Documentation initiated by:  Avie Arenas  Subjective/Objective Assessment:   CP - tx from outside hospital - CABG x5 on 10-01-12     Action/Plan:   Anticipated DC Date:  10/05/2012   Anticipated DC Plan:  HOME W HOME HEALTH SERVICES      DC Planning Services  CM consult      Choice offered to / List presented to:             Status of service:  In process, will continue to follow Medicare Important Message given?   (If response is "NO", the following Medicare IM given date fields will be blank) Date Medicare IM given:   Date Additional Medicare IM given:    Discharge Disposition:    Per UR Regulation:  Reviewed for med. necessity/level of care/duration of stay  If discussed at Long Length of Stay Meetings, dates discussed:   10/04/2012    Comments:  Contact:  Cathe Mons Daughter [phone]                 Sposito,Ron       [phone]  10-04-12 1:45pm Avie Arenas, RNBSN [phone] post op afib.  Talked with patient in room.  Patient in bed - looks worn out.  Husband and daughter in room - son on phone on speaker.  Plan is for patient to go home with husband who will be with her 24/7.  Husband is HOH. Daughter works but will be checking in. Have a rolling walker at home which she will need.  Suspect will need HH RN to follow once discharged. CM will continue to follow.

## 2012-10-02 NOTE — Progress Notes (Signed)
POD #1 cabg X 5  Doing well  BP 115/61  Pulse 77  Temp 97.5 F (36.4 C) (Oral)  Resp 15  Ht 5\' 3"  (1.6 m)  Wt 188 lb 4.4 oz (85.4 kg)  BMI 33.35 kg/m2  SpO2 95%   Intake/Output Summary (Last 24 hours) at 10/02/12 1723 Last data filed at 10/02/12 1500  Gross per 24 hour  Intake 1870.5 ml  Output   2430 ml  Net -559.5 ml    PM labs pending  Continue current care

## 2012-10-03 ENCOUNTER — Inpatient Hospital Stay (HOSPITAL_COMMUNITY): Payer: PRIVATE HEALTH INSURANCE

## 2012-10-03 LAB — GLUCOSE, CAPILLARY
Glucose-Capillary: 139 mg/dL — ABNORMAL HIGH (ref 70–99)
Glucose-Capillary: 138 mg/dL — ABNORMAL HIGH (ref 70–99)
Glucose-Capillary: 123 mg/dL — ABNORMAL HIGH (ref 70–99)
Glucose-Capillary: 129 mg/dL — ABNORMAL HIGH (ref 70–99)
Glucose-Capillary: 126 mg/dL — ABNORMAL HIGH (ref 70–99)

## 2012-10-03 LAB — CBC
HCT: 30 % — ABNORMAL LOW (ref 36.0–46.0)
Hemoglobin: 9.9 g/dL — ABNORMAL LOW (ref 12.0–15.0)
MCH: 30.2 pg (ref 26.0–34.0)
MCHC: 33 g/dL (ref 30.0–36.0)
RBC: 3.28 MIL/uL — ABNORMAL LOW (ref 3.87–5.11)

## 2012-10-03 LAB — BASIC METABOLIC PANEL
BUN: 11 mg/dL (ref 6–23)
Chloride: 95 mEq/L — ABNORMAL LOW (ref 96–112)
GFR calc Af Amer: 71 mL/min — ABNORMAL LOW (ref >90)
Glucose, Bld: 139 mg/dL — ABNORMAL HIGH (ref 70–99)
Potassium: 3.8 mEq/L (ref 3.5–5.1)
Sodium: 128 mEq/L — ABNORMAL LOW (ref 135–145)

## 2012-10-03 MED ORDER — ESTROGENS CONJUGATED 1.25 MG PO TABS
1.2500 mg | ORAL_TABLET | Freq: Every day | ORAL | Status: DC
  Administered 2012-10-03 – 2012-10-07 (×5): 1.25 mg via ORAL
  Filled 2012-10-03 (×7): qty 1

## 2012-10-03 MED ORDER — SODIUM CHLORIDE 0.9 % IJ SOLN
3.0000 mL | Freq: Two times a day (BID) | INTRAMUSCULAR | Status: DC
  Administered 2012-10-03 – 2012-10-06 (×7): 3 mL via INTRAVENOUS

## 2012-10-03 MED ORDER — INSULIN ASPART 100 UNIT/ML ~~LOC~~ SOLN: 0.0000 [IU] | Freq: Four times a day (QID) | SUBCUTANEOUS | Status: DC

## 2012-10-03 MED ORDER — SODIUM CHLORIDE 0.9 % IJ SOLN: 3.0000 mL | INTRAMUSCULAR | Status: DC | PRN

## 2012-10-03 MED ORDER — POTASSIUM CHLORIDE CRYS ER 20 MEQ PO TBCR
20.0000 meq | EXTENDED_RELEASE_TABLET | Freq: Every day | ORAL | Status: DC
  Administered 2012-10-03 – 2012-10-07 (×5): 20 meq via ORAL
  Filled 2012-10-03 (×6): qty 1

## 2012-10-03 MED ORDER — SODIUM CHLORIDE 0.9 % IV SOLN: 250.0000 mL | INTRAVENOUS | Status: DC | PRN

## 2012-10-03 MED ORDER — MOVING RIGHT ALONG BOOK
Freq: Once | Status: AC
  Administered 2012-10-03: 17:00:00
  Filled 2012-10-03 (×2): qty 1

## 2012-10-03 MED ORDER — INSULIN ASPART 100 UNIT/ML ~~LOC~~ SOLN
0.0000 [IU] | Freq: Three times a day (TID) | SUBCUTANEOUS | Status: DC
  Administered 2012-10-04 – 2012-10-06 (×9): 2 [IU] via SUBCUTANEOUS

## 2012-10-03 MED ORDER — FUROSEMIDE 40 MG PO TABS
40.0000 mg | ORAL_TABLET | Freq: Every day | ORAL | Status: DC
  Administered 2012-10-03 – 2012-10-07 (×5): 40 mg via ORAL
  Filled 2012-10-03 (×6): qty 1

## 2012-10-03 MED FILL — Albumin, Human Inj 5%: INTRAVENOUS | Qty: 250 | Status: AC

## 2012-10-03 MED FILL — Heparin Sodium (Porcine) Inj 1000 Unit/ML: INTRAMUSCULAR | Qty: 30 | Status: AC

## 2012-10-03 MED FILL — Lidocaine HCl IV Inj 20 MG/ML: INTRAVENOUS | Qty: 5 | Status: AC

## 2012-10-03 MED FILL — Mannitol IV Soln 20%: INTRAVENOUS | Qty: 500 | Status: AC

## 2012-10-03 MED FILL — Sodium Chloride Irrigation Soln 0.9%: Qty: 3000 | Status: AC

## 2012-10-03 MED FILL — Heparin Sodium (Porcine) Inj 1000 Unit/ML: INTRAMUSCULAR | Qty: 10 | Status: AC

## 2012-10-03 MED FILL — Sodium Chloride IV Soln 0.9%: INTRAVENOUS | Qty: 1000 | Status: AC

## 2012-10-03 MED FILL — Electrolyte-R (PH 7.4) Solution: INTRAVENOUS | Qty: 5000 | Status: AC

## 2012-10-03 MED FILL — Sodium Bicarbonate IV Soln 8.4%: INTRAVENOUS | Qty: 50 | Status: AC

## 2012-10-03 NOTE — Progress Notes (Signed)
Pt ambulated 191ft with RW x 1 assist, slow steady gate. Pt stopped to rest 4 times during walk. Ambulated on 1L 02, sats, upon return 90% 1L. Will continue to monitor.

## 2012-10-03 NOTE — Progress Notes (Addendum)
TCTS DAILY PROGRESS NOTE                   [address]            [name] [address]          [phone]      2 Days Post-Op Procedure(s) (LRB): CORONARY ARTERY BYPASS GRAFTING (CABG) (N/A) ENDOVEIN HARVEST OF GREATER SAPHENOUS VEIN (Bilateral)  Total Length of Stay:  LOS: 5 days   Subjective: Conts to do well, Didn't sleep well but no other complaints   Objective: Vital signs in last 24 hours: Temp:  [97.5 F (36.4 C)-98.4 F (36.9 C)] 98.4 F (36.9 C) (11/06 0728) Pulse Rate:  [76-87] 77  (11/06 0700) Cardiac Rhythm:  [-] Normal sinus rhythm (11/06 0000) Resp:  [12-22] 17  (11/06 0700) BP: (98-156)/(48-75) 130/55 mmHg (11/06 0700) SpO2:  [88 %-96 %] 92 % (11/06 0700) Arterial Line BP: (121-157)/(44-63) 121/44 mmHg (11/05 1400) Weight:  [191 lb (86.637 kg)] 191 lb (86.637 kg) (11/06 0500)  Filed Weights   10/01/12 0450 10/02/12 0600 10/03/12 0500  Weight: 175 lb 9.6 oz (79.652 kg) 188 lb 4.4 oz (85.4 kg) 191 lb (86.637 kg)    Weight change: 2 lb 11.6 oz (1.237 kg)   Hemodynamic parameters for last 24 hours: PAP: (33)/(15) 33/15 mmHg CO:  [4.4 L/min] 4.4 L/min CI:  [2.3 L/min/m2] 2.3 L/min/m2  Intake/Output from previous day: 11/05 0701 - 11/06 0700 In: 770 [P.O.:120; I.V.:598; IV Piggyback:52] Out: 1170 [Urine:1110; Chest Tube:60]  Intake/Output this shift:    Current Meds: Scheduled Meds:   . acetaminophen  1,000 mg Oral Q6H   Or  . acetaminophen (TYLENOL) oral liquid 160 mg/5 mL  975 mg Per Tube Q6H  . aspirin EC  325 mg Oral Daily   Or  . aspirin  324 mg Per Tube Daily  . atorvastatin  20 mg Oral q1800  . bisacodyl  10 mg Oral Daily   Or  . bisacodyl  10 mg Rectal Daily  . cefUROXime (ZINACEF)  IV  1.5 g Intravenous Q12H  . docusate sodium  200 mg Oral Daily  . [COMPLETED] furosemide  40 mg Intravenous Once  . insulin aspart  0-24 Units Subcutaneous Q6H  . metoprolol tartrate  12.5 mg Oral BID   Or  . metoprolol tartrate   12.5 mg Per Tube BID  . pantoprazole  40 mg Oral Daily  . [COMPLETED] potassium chloride  20 mEq Oral Once  . sodium chloride  3 mL Intravenous Q12H  . [DISCONTINUED] insulin aspart  0-24 Units Subcutaneous Q4H   Continuous Infusions:   . sodium chloride 20 mL (10/01/12 1430)  . sodium chloride 20 mL (10/01/12 1400)  . sodium chloride 250 mL (10/02/12 0546)  . dexmedetomidine Stopped (10/01/12 1615)  . lactated ringers 60 mL (10/01/12 1400)  . nitroGLYCERIN Stopped (10/02/12 1300)  . phenylephrine (NEO-SYNEPHRINE) Adult infusion     PRN Meds:.[EXPIRED] albumin human, metoprolol, midazolam, morphine injection, ondansetron (ZOFRAN) IV, oxyCODONE, sodium chloride, traMADol  General appearance: alert, cooperative and no distress Heart: regular rate and rhythm Lungs: mildly dim in bases Abdomen: soft, non-tender, + BS Extremities: moderate edema LE's Wound: incisions healing well  Lab Results: CBC: Basename 10/03/12 0419 10/02/12 1825 10/02/12 1700  WBC 10.6* -- 10.8*  HGB 9.9* 10.2* --  HCT 30.0* 30.0* --  PLT 153 -- 167   BMET:  Basename 10/03/12 0419 10/02/12 1825 10/02/12 0356  NA 128* 132* --  K 3.8 4.1 --  CL 95* 97 --  CO2 25 -- 23  GLUCOSE 139* 160* --  BUN 11 9 --  CREATININE 0.86 0.90 --  CALCIUM 7.1* -- 7.3*    PT/INR:  Basename 10/01/12 1400  LABPROT 17.6*  INR 1.49   Radiology: Dg Chest Portable 1 View In Am  10/02/2012  *RADIOLOGY REPORT*  Clinical Data: Status post CABG.  PORTABLE CHEST - 1 VIEW  Comparison: Chest x-ray 10/01/2012.  Findings: Endotracheal and nasogastric tubes have been removed. Left-sided chest tube is unchanged in position and appears properly located with tip near the left apex.  Right IJ Cordis with a Swan- Ganz catheter positioned in the proximal pulmonic trunk is unchanged.  Mediastinal drain remains in position.  Lung volumes remain low.  There is a retrocardiac opacity on the left that likely reflects resolving postoperative  atelectasis.  Ill- defined interstitial opacity in the right upper lobe is decreasing in prominence, and may reflect resolving infection or inflammation. Pulmonary vascular crowding, without frank pulmonary edema.  Mild cardiomegaly is unchanged. The patient is rotated to the right on today's exam, resulting in distortion of the mediastinal contours and reduced diagnostic sensitivity and specificity for mediastinal pathology.  Atherosclerosis in the thoracic aorta. Status post median sternotomy for CABG.  IMPRESSION: 1.  Postoperative changes and support apparatus, as above. 2.  Improving aeration in the right upper lobe may reflect resolving infection or inflammation. 3.  Improving aeration at the left base most compatible with resolving postoperative atelectasis.   Original Report Authenticated By: Trudie Reed, M.D.    Dg Chest Portable 1 View  10/01/2012  *RADIOLOGY REPORT*  Clinical Data: Postop day 0 status post CABG.  PORTABLE CHEST - 1 VIEW  Comparison: 09/30/2012  Findings: Endotracheal tube tip is 2.5 cm above the carina. Nasogastric tube enters the stomach.  Right internal jugular Swan- Ganz catheter tip projects in the proximal portion of the main pulmonary artery.  Epicardial pacer leads, left chest tube, and mediastinal drain noted.  No discrete pneumothorax observed.  There is likely mild pneumomediastinum.  Left lower lobe airspace opacity favors atelectasis.  There is mild airspace opacity in the right upper lobe.  Borderline cardiomegaly noted with pulmonary venous hypertension and borderline interstitial edema.  Atherosclerotic calcification of the aortic arch is present.  IMPRESSION:  1.  Postop day 0 status post CABG.  Tubes and lines appear satisfactorily positioned although the Swan-Ganz catheter tip is somewhat low/proximal in the main pulmonary artery. 2.  Lucency along the right side of the mediastinum may reflect a tiny amount of pneumomediastinum, not unexpected status post CABG. 3.   Airspace opacity in the left lower lobe and right upper lobe, potentially from atelectasis or confluent edema. 4.  Pulmonary venous hypertension and borderline interstitial edema, with borderline cardiomegaly.   Original Report Authenticated By: Gaylyn Rong, M.D.      Assessment/Plan: S/P Procedure(s) (LRB): CORONARY ARTERY BYPASS GRAFTING (CABG) (N/A) ENDOVEIN HARVEST OF GREATER SAPHENOUS VEIN (Bilateral)  1 Conts to do well 2 cont diuresis, watch sodium  3 H/H stable 4 push pulm toilet/rehab 5 transfer to 2000     Scott,Kimberly E 10/03/2012 7:51 AM   I have seen and examined Kimberly Scott and agree with the above assessment  and plan.  Delight Ovens MD Beeper [phone] Office [phone] 10/03/2012 8:55 AM

## 2012-10-03 NOTE — Progress Notes (Deleted)
Pt refused CBG check  

## 2012-10-03 NOTE — Progress Notes (Signed)
Chaplain visited patient during a routine rounds at 2315. Patient was sitting in her recliner and watching a TV. Chaplain provided ministry of empathetic listening and presence. Chaplain shared words of comfort and encouragement, and prayed with patient. Chaplain will continue to visit and provide spiritual support and care to patient as needed. Patient thanked Chaplain for the visit.

## 2012-10-03 NOTE — Progress Notes (Signed)
1315 Came to walk with pt. Pt states she is sleepy and does not feel up to walking now. Encouraged pt to walk with staff later. Will follow up tomorrow. Luetta Nutting RN

## 2012-10-03 NOTE — Op Note (Signed)
Kimberly Scott, Kimberly Scott NO.:  [id number]  MEDICAL RECORD NO.:  [id number]  LOCATION:  2017                         FACILITY:  [facility]  PHYSICIAN:  Sheliah Plane, MD    DATE OF BIRTH:  1928/02/13  DATE OF PROCEDURE:  10/01/2012 DATE OF DISCHARGE:                              OPERATIVE REPORT   PREOPERATIVE DIAGNOSIS:  Coronary occlusive disease with unstable angina.  POSTOPERATIVE DIAGNOSIS:  Coronary occlusive disease with unstable angina.  SURGICAL PROCEDURE:  Coronary artery bypass grafting x5 with left internal mammary to the left anterior descending coronary artery, reverse saphenous vein graft to the second diagonal, reverse saphenous vein graft to the first obtuse marginal, sequential reverse saphenous vein graft to the posterior descending and posterolateral branches of the right coronary artery. Endovein harvesting.  SURGEON:  Sheliah Plane, MD  FIRST ASSISTANT:  Rowe Clack, PA-C  BRIEF HISTORY:  The patient is an 78 year old female who presented to Dr. Gwen Pounds.  Cardiac catheterization was performed, which demonstrated high-grade 3-vessel coronary artery disease with 90% LAD in two areas, high-grade proximal circ with a moderate size first obtuse marginal and then diffuse disease in two smaller OM branches.  She also had 80% right obstruction proximally and disease at the ostium of the PD and PL.  Overall ventricular function was preserved.  Coronary artery bypass grafting was recommended because of symptoms and severe three- vessel coronary artery disease.  The patient agreed and signed informed consent.  DESCRIPTION OF PROCEDURE:  Swan-Ganz and arterial line monitors were in place, the patient underwent general endotracheal anesthesia without incident.  Skin of the chest and legs were prepped with Betadine and draped in usual sterile manner.  Using the [employer] endo vein harvesting system, segment of vein was harvested from the right  thigh.  As we got below the knee, the vein became small and additional segment of vein was harvested from the left thigh and was of good quality and caliber. Median sternotomy was performed.  Left internal mammary artery was dissected down as a pedicle graft.  The distal artery was divided and had good free flow.  Pericardium was opened.  Overall ventricular function appeared preserved.  The patient was systemically heparinized. Ascending aorta was cannulated.  The right atrium was cannulated and aortic root vent cardioplegia needle was introduced into the ascending aorta.  The patient was placed on cardiopulmonary bypass at 2.4 liters/minute/meter squared.  Sites of anastomosis were selected and dissected the epicardium.  The patient's body temperature cooled to 32 degrees.  Aortic crossclamp was applied, 500 mL of cold blood potassium cardioplegia was administered with diastolic arrest of the heart. Myocardial septal temperature was monitored throughout the crossclamp period.  Attention was turned first to the posterior descending coronary artery, which was opened and admitted a 1 mm probe distally.  Using __________ carried to the posterolateral branch, which was slightly larger, 1.3-1.4 mm in size.  Using a running 8-0 Prolene, distal anastomosis was performed.  The heart was then elevated and the first obtuse marginal was identified.  The second and third obtuse marginals were very small, less than 1 mm and not felt to be large enough to bypass.  The first OM was  opened and was approximately 1.3-1.4 mm in size.  Using a running 8-0 Prolene, a segment of reverse saphenous vein graft was anastomosed to the first obtuse marginal.  The distal LAD and distal second diagonal were both relatively small vessel, but the diagonal vessel was opened approximately 1.2-1.3 mm in size using a running 8-0 Prolene.  Segment of reverse saphenous vein graft was anastomosed to the diagonal.  The left  internal mammary artery was then anastomosed to the distal LAD, which had been opened and was approximately 1.2-1.3 mm in size distally.  Using a running 8-0 Prolene, anastomosis was performed.  With crossclamp still in place, three punch aortotomies were performed and each of the three vein grafts were anastomosed to the ascending aorta.  Air was evacuated from the grafts. Bulldog was removed from the mammary artery with rise in myocardial septal temperature.  Aortic crossclamp was removed.  The patient was spontaneously converted to a sinus rhythm, was atrially paced to increase rate.  Sites of anastomosis were inspected and free of bleeding.  She was then ventilated and weaned from cardiopulmonary bypass without difficulty.  She remained hemodynamically stable, was decannulated in usual fashion.  Protamine sulfate was administered with the operative field hemostatic and left pleural tube and Blake mediastinal drain were left in place.  Pericardium was loosely reapproximated.  Sternum was closed with #6 stainless steel wire. Fascia was closed with interrupted 0 Vicryl, running 3-0 Vicryl in the subcutaneous tissue, and 4-0 subcuticular stitch in skin edges.  Dry dressings were applied.  Sponge and needle count was reported as correct at completion of the procedure.  The patient tolerated the procedure without obvious complication and was transferred to the Surgical Intensive Care Unit for further postoperative care.  The patient did require red blood cells during the operative procedure because of expected acute blood loss and also preoperative anemia.  Total pump time was 137 minutes.     Sheliah Plane, MD     EG/MEDQ  D:  10/03/2012  T:  10/03/2012  Job:  [id number]  cc:   Arnoldo Hooker, MD

## 2012-10-03 NOTE — Progress Notes (Signed)
Report called to Sherri, RN and met an Charity fundraiser at bedside.  Patient was transferred to room 2017 and attached to tele monitor without complication.  Belongings travelled with patient.

## 2012-10-03 NOTE — Plan of Care (Signed)
Problem: Phase III Progression Outcomes Goal: Time patient transferred to PCTU/Telemetry POD Outcome: Completed/Met Date Met:  10/03/12 1130

## 2012-10-04 LAB — BASIC METABOLIC PANEL
Calcium: 7.5 mg/dL — ABNORMAL LOW (ref 8.4–10.5)
Chloride: 93 mEq/L — ABNORMAL LOW (ref 96–112)
Creatinine, Ser: 0.79 mg/dL (ref 0.50–1.10)
GFR calc Af Amer: 87 mL/min — ABNORMAL LOW (ref >90)
GFR calc non Af Amer: 75 mL/min — ABNORMAL LOW (ref >90)

## 2012-10-04 LAB — CBC
MCV: 90.3 fL (ref 78.0–100.0)
Platelets: 189 10*3/uL (ref 150–400)
RDW: 12.7 % (ref 11.5–15.5)
WBC: 11.3 10*3/uL — ABNORMAL HIGH (ref 4.0–10.5)

## 2012-10-04 LAB — TYPE AND SCREEN
ABO/RH(D): A POS
Antibody Screen: NEGATIVE
Unit division: 0
Unit division: 0

## 2012-10-04 LAB — GLUCOSE, CAPILLARY
Glucose-Capillary: 126 mg/dL — ABNORMAL HIGH (ref 70–99)
Glucose-Capillary: 155 mg/dL — ABNORMAL HIGH (ref 70–99)
Glucose-Capillary: 157 mg/dL — ABNORMAL HIGH (ref 70–99)
Glucose-Capillary: 125 mg/dL — ABNORMAL HIGH (ref 70–99)

## 2012-10-04 MED ORDER — POTASSIUM CHLORIDE CRYS ER 20 MEQ PO TBCR: 20.0000 meq | EXTENDED_RELEASE_TABLET | Freq: Every day | ORAL | Status: DC

## 2012-10-04 MED ORDER — ATORVASTATIN CALCIUM 20 MG PO TABS: 20.0000 mg | ORAL_TABLET | Freq: Every day | ORAL | Status: DC

## 2012-10-04 MED ORDER — TRAMADOL HCL 50 MG PO TABS: 50.0000 mg | ORAL_TABLET | Freq: Four times a day (QID) | ORAL | Status: DC | PRN

## 2012-10-04 MED ORDER — METOPROLOL TARTRATE 25 MG PO TABS: 12.5000 mg | ORAL_TABLET | Freq: Two times a day (BID) | ORAL | Status: DC

## 2012-10-04 MED ORDER — FUROSEMIDE 40 MG PO TABS: 40.0000 mg | ORAL_TABLET | Freq: Every day | ORAL | Status: DC

## 2012-10-04 MED ORDER — ASPIRIN 325 MG PO TBEC: 325.0000 mg | DELAYED_RELEASE_TABLET | Freq: Every day | ORAL | Status: DC

## 2012-10-04 NOTE — Progress Notes (Signed)
CARDIAC REHAB PHASE I   PRE:  Rate/Rhythm: 85SR  BP:  Supine:   Sitting: 110/70  Standing:    SaO2: 90%RA  MODE:  Ambulation: 150 ft   POST:  Rate/Rhythem: 92SR  BP:  Supine:   Sitting: 130/60  Standing:    SaO2: 89-91%RA 0855-0926 Pt walked 150 ft with rolling walker and asst x 1 with slow pace. Had to sit down in chair once during walk to rest. Stated she had to sit. Took several standing rest breaks. Encouraged pursed lip breathing. To bathroom and then to recliner. Comfort measures given.  Duanne Limerick

## 2012-10-04 NOTE — Progress Notes (Signed)
Patient's family requested Chaplain visit patient.  Chaplain provided spiritual and emotional support and presented patient with a prayer shawl.  Prayed with patient.  Will leave continued follow up to Unit Chaplain.  Chaplain Rutherford Nail

## 2012-10-04 NOTE — Progress Notes (Addendum)
                   [address]            [place] [address]          [phone]      3 Days Post-Op Procedure(s) (LRB): CORONARY ARTERY BYPASS GRAFTING (CABG) (N/A) ENDOVEIN HARVEST OF GREATER SAPHENOUS VEIN (Bilateral)  Subjective: Patient eating breakfast this am. She has not had a bowel movement and is not sleeping all that well. She does not want a sleeping aid or laxative at this time.  Objective: Vital signs in last 24 hours: Temp:  [98.3 F (36.8 C)-98.6 F (37 C)] 98.3 F (36.8 C) (11/07 0443) Pulse Rate:  [74-82] 81  (11/07 0443) Cardiac Rhythm:  [-] Normal sinus rhythm (11/06 2030) Resp:  [16-24] 19  (11/07 0443) BP: (112-143)/(60-90) 122/74 mmHg (11/07 0443) SpO2:  [91 %-95 %] 91 % (11/07 0443) Weight:  [193 lb 12.6 oz (87.9 kg)] 193 lb 12.6 oz (87.9 kg) (11/07 0500)  Pre op weight  79 kg Current Weight  10/04/12 193 lb 12.6 oz (87.9 kg)     Intake/Output from previous day: 11/06 0701 - 11/07 0700 In: 320 [P.O.:240; I.V.:80] Out: 520 [Urine:520]   Physical Exam:  Cardiovascular: RRR Pulmonary: Diminished at bases; no rales, wheezes, or rhonchi. Abdomen: Soft, non tender, bowel sounds present. Extremities: Bilateral lower extremity edema. Wounds: Clean and dry.  No erythema or signs of infection.  Lab Results: CBC: Basename 10/04/12 0535 10/03/12 0419  WBC 11.3* 10.6*  HGB 9.7* 9.9*  HCT 29.0* 30.0*  PLT 189 153   BMET:  Basename 10/04/12 0535 10/03/12 0419  NA 126* 128*  K 3.9 3.8  CL 93* 95*  CO2 23 25  GLUCOSE 128* 139*  BUN 13 11  CREATININE 0.79 0.86  CALCIUM 7.5* 7.1*    PT/INR:  Lab Results  Component Value Date   INR 1.49 10/01/2012   INR 1.06 09/29/2012   ABG:  INR: Will add last result for INR, ABG once components are confirmed Will add last 4 CBG results once components are confirmed  Assessment/Plan:  1. CV - SR. 2.  Pulmonary - Encourage incentive spirometer and will get a flutter valve as  well. 3. Volume Overload - On Lasix 40 po daily 4.  Acute blood loss anemia - H and H stable at 9.7 and 29 this am. 5.Hyponatremia-Sodium is decreased to 126.  6.Pre op HGA1C 6.5. CBGs 129/126/126. On SS insulin. Will need follow up as an outpatient. 7.Remove EPW in am  ZIMMERMAN,DONIELLE MPA-C 10/04/2012,7:49 AM   poss home 1-2 days I have seen and examined Kimberly Scott and agree with the above assessment  and plan.  Delight Ovens MD Beeper [phone] Office [phone] 10/04/2012 8:52 AM

## 2012-10-04 NOTE — Discharge Summary (Addendum)
Physician Discharge Summary  Patient ID: CONCEPTION DOEBLER MRN: [id number] DOB/AGE: 03/07/1928 78 y.o.  Admit date: 09/28/2012 Discharge date: 10/07/2012  Admission Diagnoses: 1.Multivessel CAD 2.History of hypertension 3.History of hyperlipidemia 4.History of GERD 5.History of skin cancer  Discharge Diagnoses:  1.Multivessel CAD 2.History of hypertension 3.History of hyperlipidemia 4.History of GERD 5.History of skin cancer 6.ABL anemia 7.Hyponatremia  Procedure (s):  Coronary artery bypass grafting x5 with left internal mammary to the left anterior descending coronary artery, reverse saphenous vein graft to the second diagonal, reverse saphenous  vein graft to the first obtuse marginal, sequential reverse saphenous vein graft to the posterior descending and posterolateral branches of the right coronary artery. Endovein harvesting by Dr. Tyrone Sage on 10/01/2012.  History of Presenting Illness:n This is an 78 two  year old [place] female, with a medical history of hypertenison and hyperlipidemia, who was transferred from [facility] where she was evaluated for chest pain. According to medical records, She underwent a stress test which showed abnormal myocardial perfusion consistent with myocardial ischemia.  Echocardiogram showed moderate mitral and tricuspid regurgitation and normal LV systolic function.She underwent a cardiac catheterization by Dr. Gwen Pounds at [facility] on 09/27/2012.  Cardiac cath showed severe three vessel disease.She was transferred to [facility] for coronary artery bypass grafting surgery.She primarily described her symptoms as exertional chest pain with ambulation, worsening over the past 6 months. The pain is relieved with rest.She denied any rest symptoms.  The pain does not radiate. There is occasional shortness of breath. Pre operative carotid US showed no significant internal carotid artery stenosis bilaterally. ABI's  bilaterally were greater than 1.Potential risks, complications, and benefits of coronary artery bypass grafting surgery were discussed with the patient and she agreed to proceed. She underwent a CABG x 5 on 10/01/2012.   Brief Hospital Course:  She was extubated late the evening of surgery. She remained afebrile and hemodynamically stable. Her Theone Murdoch, a line, foley, and chest tubes were all removed early in her post operative course.She was initially  A paced and weaned off inotropic medicine. She was found to have ABL anemia. Her last H and H was 9.7 and 29. She was volume overloaded and diuresed accordingly. She was started on low dose Lopressor. She was felt surgically stable for transfer from the ICU to [facility] for further convalescence on 10/03/2012. She has been tolerating a diet but has not had a bowel movement yet. She is ambulating  on room air. Her epicardial pacing wires will be removed in the morning.She has had hyponatremia as well. Her last sodium was 126. Her last sodium was up to 133. She continued to progress with cardiac rehab and was ambulating more the last couple of days.She is felt surgically stable for discharge today on  10/07/2012.   Latest Vital Signs: Filed Vitals:   10/07/12 0511  BP: 137/56  Pulse: 70  Temp: 97.4 F (36.3 C)  Resp: 18   Physical Exam: Cardiovascular: RRR  Pulmonary: Diminished at bases; no rales, wheezes, or rhonchi.  Abdomen: Soft, non tender, bowel sounds present.  Extremities: Bilateral lower extremity edema.  Wounds: Clean and dry. No erythema or signs of infection.   Discharge Condition:Stable  Recent laboratory studies:  Lab Results  Component Value Date   WBC 11.3* 10/04/2012   HGB 9.7* 10/04/2012   HCT 29.0* 10/04/2012   MCV 90.3 10/04/2012   PLT 189 10/04/2012   Lab Results  Component Value Date   NA 133 10/04/2012  K 4.0 10/04/2012   CL 98 10/04/2012   CO2 27 10/04/2012   CREATININE 0.3 10/04/2012   GLUCOSE 113 10/04/2012        Diagnostic Studies: Dg Chest Port 1 View  10/03/2012  *RADIOLOGY REPORT*  Clinical Data: Postop CABG  PORTABLE CHEST - 1 VIEW  Comparison: 10/02/2012; 10/01/2022 pain; 09/30/2012  Findings: Grossly unchanged cardiac silhouette and mediastinal contours post median sternotomy and CABG.  Interval removal of PA catheter with remaining vascular sheath tip overlying the caudal aspect of the right internal jugular vein.  Interval removal of left-sided chest tube without definite pneumothorax.  Worsening right mid lung and left basilar heterogeneous opacities favored to represent atelectasis.  Query trace bilateral effusions. Unchanged bones.  IMPRESSION: 1.  Interval removal of support apparatus as above.  No pneumothorax. 2.  Worsening right mid and left lower lung opacities favored to represent atelectasis. 3.  Query trace bilateral effusions.   Original Report Authenticated By: Tacey Ruiz, MD    Discharge Orders    Future Appointments: Provider: Department: Dept Phone: Center:   11/01/2012 3:00 PM Delight Ovens, MD [facility] [phone] [facility]      Discharge Medications:    Medication List     As of 10/07/2012 10:25 AM    TAKE these medications         amLODipine-benazepril 5-10 MG per capsule   Commonly known as: LOTREL   Take 1 capsule by mouth daily.      aspirin 325 MG EC tablet   Take 1 tablet (325 mg total) by mouth daily.      aspirin-acetaminophen-caffeine 250-250-65 MG per tablet   Commonly known as: EXCEDRIN MIGRAINE   Take 1 tablet by mouth every 6 (six) hours as needed. For pain      atorvastatin 20 MG tablet   Commonly known as: LIPITOR   Take 1 tablet (20 mg total) by mouth daily at 6 PM.      calcium citrate 950 MG tablet   Commonly known as: CALCITRATE - dosed in mg elemental calcium   Take 1 tablet by mouth daily.      estrogens (conjugated) 1.25 MG tablet   Commonly known as: PREMARIN   Take 1.25 mg by mouth  daily.      furosemide 40 MG tablet   Commonly known as: LASIX   Take 1 tablet (40 mg total) by mouth daily. For 7 days then stop.      metoprolol tartrate 25 MG tablet   Commonly known as: LOPRESSOR   Take 0.5 tablets (12.5 mg total) by mouth 2 (two) times daily.      potassium chloride SA 20 MEQ tablet   Commonly known as: K-DUR,KLOR-CON   Take 1 tablet (20 mEq total) by mouth daily. For 7 days then stop.      traMADol 50 MG tablet   Commonly known as: ULTRAM   Take 1 tablet (50 mg total) by mouth every 6 (six) hours as needed for pain.      vitamin C 1000 MG tablet   Take 1,000 mg by mouth daily.      vitamin E 400 UNIT capsule   Take 400 Units by mouth daily.      Zinc Monomethionine Powd   Take 30 mg by mouth daily.          The patient has been discharged on:   1.Beta Blocker:  Yes [   x]  No   [   ]                              If No, reason:  2.Ace Inhibitor/ARB: Yes [  x ]                                     No  [    ]                                     If No, reason:   3.Statin:   Yes [ x  ]                  No  [   ]                  If No, reason:  4.Ecasa:  Yes  [   x]                  No   [   ]                  If No, reason:     Follow Up Appointments:     Follow-up Information    Follow up with Lamar Blinks, MD. (Call for a follow up appointment for 2 weeks)    Contact information:   [address] [place] [address] [phone]       Follow up with GERHARDT,EDWARD B, MD. (PA/LAT CXR to be taken (at [facility] which is in the same building as Dr. Dennie Maizes office) on 11/01/2012 at 2:00pm;Appointment with Dr. Tyrone Sage is on 11/01/2012 at 3:00 pm)    Contact information:   [address] [employer] [address] [place] [address] [phone]       Follow up with Yates Decamp, MD. (Call regarding further follow up of HGA1C 6.5)    Contact information:   [address] [place] [address] [phone]          Signed: Doree Fudge MPA-C 10/04/2012, 10:30 AM

## 2012-10-04 NOTE — Progress Notes (Signed)
Pt ambulated 53ft before requesting to sit down, stating she could not walk any further. Offered encouragement, but pt still refused. Pt returned to room, call bell within reach.  Will continue to monitor.

## 2012-10-05 LAB — GLUCOSE, CAPILLARY
Glucose-Capillary: 120 mg/dL — ABNORMAL HIGH (ref 70–99)
Glucose-Capillary: 137 mg/dL — ABNORMAL HIGH (ref 70–99)
Glucose-Capillary: 135 mg/dL — ABNORMAL HIGH (ref 70–99)
Glucose-Capillary: 147 mg/dL — ABNORMAL HIGH (ref 70–99)

## 2012-10-05 LAB — BASIC METABOLIC PANEL
BUN: 12 mg/dL (ref 6–23)
Calcium: 8.3 mg/dL — ABNORMAL LOW (ref 8.4–10.5)
GFR calc Af Amer: 74 mL/min — ABNORMAL LOW (ref >90)
GFR calc non Af Amer: 64 mL/min — ABNORMAL LOW (ref >90)
Glucose, Bld: 125 mg/dL — ABNORMAL HIGH (ref 70–99)
Potassium: 4 mEq/L (ref 3.5–5.1)

## 2012-10-05 MED ORDER — POTASSIUM CHLORIDE CRYS ER 20 MEQ PO TBCR: 20.0000 meq | EXTENDED_RELEASE_TABLET | Freq: Every day | ORAL | Status: DC

## 2012-10-05 MED ORDER — FUROSEMIDE 40 MG PO TABS: 40.0000 mg | ORAL_TABLET | Freq: Every day | ORAL | Status: DC

## 2012-10-05 MED ORDER — AMLODIPINE BESYLATE 5 MG PO TABS
5.0000 mg | ORAL_TABLET | Freq: Every day | ORAL | Status: DC
  Administered 2012-10-05 – 2012-10-07 (×3): 5 mg via ORAL
  Filled 2012-10-05 (×3): qty 1

## 2012-10-05 MED ORDER — AMLODIPINE BESY-BENAZEPRIL HCL 5-10 MG PO CAPS: 1.0000 | ORAL_CAPSULE | Freq: Every day | ORAL | Status: DC

## 2012-10-05 MED ORDER — BENAZEPRIL HCL 10 MG PO TABS
10.0000 mg | ORAL_TABLET | Freq: Every day | ORAL | Status: DC
  Administered 2012-10-05 – 2012-10-07 (×3): 10 mg via ORAL
  Filled 2012-10-05 (×3): qty 1

## 2012-10-05 NOTE — Progress Notes (Addendum)
Patient refused a third walk this evening "except back and forth to the bathroom," she stated.  The patient did ambulate twice to the bathroom this evening.  She was steady on her feet with the walker.  Will continue to encourage.  Arva Chafe

## 2012-10-05 NOTE — Progress Notes (Addendum)
                   [address]            [place] [address]          [phone]      4 Days Post-Op Procedure(s) (LRB): CORONARY ARTERY BYPASS GRAFTING (CABG) (N/A) ENDOVEIN HARVEST OF GREATER SAPHENOUS VEIN (Bilateral)  Subjective: Patient had a bowel movement and slept well.  Objective: Vital signs in last 24 hours: Temp:  [98 F (36.7 C)-98.7 F (37.1 C)] 98 F (36.7 C) (11/08 0652) Pulse Rate:  [78-84] 84  (11/08 0652) Cardiac Rhythm:  [-] Normal sinus rhythm (11/07 2000) Resp:  [18] 18  (11/08 0652) BP: (138-153)/(59-78) 140/74 mmHg (11/08 0652) SpO2:  [91 %-92 %] 91 % (11/08 0652)  Pre op weight  79 kg Current Weight  10/04/12 193 lb 12.6 oz (87.9 kg)     Intake/Output from previous day: 11/07 0701 - 11/08 0700 In: 3 [I.V.:3] Out: 400 [Urine:400]   Physical Exam:  Cardiovascular: RRR Pulmonary: Slightly diminished at bases; no rales, wheezes, or rhonchi. Abdomen: Soft, non tender, bowel sounds present. Extremities: Bilateral lower extremity edema. Wounds: Clean and dry.  No erythema or signs of infection.  Lab Results: CBC:  Basename 10/04/12 0535 10/03/12 0419  WBC 11.3* 10.6*  HGB 9.7* 9.9*  HCT 29.0* 30.0*  PLT 189 153   BMET:   Basename 10/05/12 0545 10/04/12 0535  NA 133* 126*  K 4.0 3.9  CL 98 93*  CO2 27 23  GLUCOSE 125* 128*  BUN 12 13  CREATININE 0.83 0.79  CALCIUM 8.3* 7.5*    PT/INR:  Lab Results  Component Value Date   INR 1.49 10/01/2012   INR 1.06 09/29/2012   ABG:  INR: Will add last result for INR, ABG once components are confirmed Will add last 4 CBG results once components are confirmed  Assessment/Plan:  1. CV - SR.On Lopressor 12.5 bid. Will restart Lotrel 5/10 for better bp control. 2.  Pulmonary - Encourage incentive spirometer and will get a flutter valve as well. 3. Volume Overload - On Lasix 40 po daily 4.  Acute blood loss anemia - H and H stable at 9.7 and 29 this  am. 5.Hyponatremia-Sodium is increased to 133.  6.Pre op HGA1C 6.5. CBGs 157/125/120. On SS insulin. Will need follow up as an outpatient. 7.Remove EPW and CT sutures 8.Needs to ambulate more-continue CRPI 9. Possible discharge later today vs am  ZIMMERMAN,DONIELLE MPA-C 10/05/2012,7:15 AM    Home 1-2 days I have seen and examined Gilford Silvius and agree with the above assessment  and plan.  Delight Ovens MD Beeper [phone] Office [phone] 10/05/2012 10:38 AM

## 2012-10-05 NOTE — Progress Notes (Signed)
CARDIAC REHAB PHASE I   PRE:  Rate/Rhythm: 78SR  BP:  Supine:   Sitting: 155/64  Standing:    SaO2: 92%RA  MODE:  Ambulation: 350 ft   POST:  Rate/Rhythem: 97SR  BP:  Supine:   Sitting: 165/63  Standing:    SaO2: 92%RA 1035-1113 Pt walked 350 ft on RA pushing wheelchair with asst x 2. Used wheelchair in case pt became fatigued and needed to sit.  Pt encouraged to take a standing rest break instead of sitting. Tolerated increase in distance well. To bed after walk. Pt did not want to go to recliner after walk. Family and pt to receive education tomorrow. Can be asst x 1.  Duanne Limerick

## 2012-10-05 NOTE — Progress Notes (Signed)
D/C EPW per protocol and as ordered, all ends intact, no ectopy/bleeding noted. Pt reminded to lie supine approximately one hour. Last  INR 1.45, VSS.  Call bell within reach.  Will continue to monitor.

## 2012-10-06 LAB — GLUCOSE, CAPILLARY
Glucose-Capillary: 127 mg/dL — ABNORMAL HIGH (ref 70–99)
Glucose-Capillary: 126 mg/dL — ABNORMAL HIGH (ref 70–99)
Glucose-Capillary: 157 mg/dL — ABNORMAL HIGH (ref 70–99)
Glucose-Capillary: 137 mg/dL — ABNORMAL HIGH (ref 70–99)

## 2012-10-06 MED ORDER — GUAIFENESIN 100 MG/5ML PO SOLN
5.0000 mL | ORAL | Status: DC | PRN
  Administered 2012-10-06 (×2): 100 mg via ORAL
  Filled 2012-10-06 (×3): qty 5

## 2012-10-06 NOTE — Progress Notes (Signed)
[id number] Reviewed d/c education with patient and patient's family including sternal precautions, IS use, restrictions, risk factor modification, and activity progression. Pt voices understanding of instructions given. Discussed Phase 2 Cardiac Rehab, and pt is interested. Permission given to send contact information to CR program at [facility].

## 2012-10-06 NOTE — Progress Notes (Signed)
Patient ambulated 150 ft x 1 assist. Tolerated well, one rest stop, needs lost of encouragement and motivation for walks .  Back to bed with call bell in reach.

## 2012-10-06 NOTE — Progress Notes (Addendum)
                   [address]            [place] [address]          [phone]      5 Days Post-Op Procedure(s) (LRB): CORONARY ARTERY BYPASS GRAFTING (CABG) (N/A) ENDOVEIN HARVEST OF GREATER SAPHENOUS VEIN (Bilateral)  Subjective: Patient with complaints of dry, nonproductive cough this am.  Objective: Vital signs in last 24 hours: Temp:  [98.2 F (36.8 C)-98.9 F (37.2 C)] 98.2 F (36.8 C) (11/09 0517) Pulse Rate:  [72-81] 72  (11/09 0517) Cardiac Rhythm:  [-] Normal sinus rhythm (11/08 1930) Resp:  [18-20] 20  (11/09 0517) BP: (132-165)/(56-63) 148/63 mmHg (11/09 0517) SpO2:  [92 %-94 %] 92 % (11/09 0517) Weight:  [180 lb 12.4 oz (82 kg)] 180 lb 12.4 oz (82 kg) (11/09 0312)  Pre op weight  79 kg Current Weight  10/06/12 180 lb 12.4 oz (82 kg)     Intake/Output from previous day: 11/08 0701 - 11/09 0700 In: 3 [I.V.:3] Out: 400 [Urine:400]   Physical Exam:  Cardiovascular: RRR Pulmonary: Slightly diminished at bases; no rales, wheezes, or rhonchi. Abdomen: Soft, non tender, bowel sounds present. Extremities: Bilateral lower extremity edema. Wounds: Clean and dry.  No erythema or signs of infection.  Lab Results: CBC:  Basename 10/04/12 0535  WBC 11.3*  HGB 9.7*  HCT 29.0*  PLT 189   BMET:   Basename 10/05/12 0545 10/04/12 0535  NA 133* 126*  K 4.0 3.9  CL 98 93*  CO2 27 23  GLUCOSE 125* 128*  BUN 12 13  CREATININE 0.83 0.79  CALCIUM 8.3* 7.5*    PT/INR:  Lab Results  Component Value Date   INR 1.49 10/01/2012   INR 1.06 09/29/2012   ABG:  INR: Will add last result for INR, ABG once components are confirmed Will add last 4 CBG results once components are confirmed  Assessment/Plan:  1. CV - SR.On Lopressor 12.5 bid and Lotrel 5/10  2.  Pulmonary - Encourage incentive spirometer and will get a flutter valve as well. 3. Volume Overload - On Lasix 40 po daily 4.  Acute blood loss anemia - H and H stable at 9.7 and 29  this am. 5.Hyponatremia-Last sodium is increased to 133.  6.Pre op HGA1C 6.5. CBGs 157/125/120. On SS insulin. Will need follow up as an outpatient. 7.Needs to ambulate more-continue CRPI 8.Robitussin prn cough 9.Probable discharge am  ZIMMERMAN,DONIELLE MPA-C 10/06/2012,7:51 AM     Chart reviewed, patient examined, agree with above.\

## 2012-10-07 LAB — GLUCOSE, CAPILLARY: Glucose-Capillary: 113 mg/dL — ABNORMAL HIGH (ref 70–99)

## 2012-10-07 NOTE — Progress Notes (Signed)
                   [address]            [place] [address]          [phone]      6 Days Post-Op Procedure(s) (LRB): CORONARY ARTERY BYPASS GRAFTING (CABG) (N/A) ENDOVEIN HARVEST OF GREATER SAPHENOUS VEIN (Bilateral)  Subjective: Patient feeling well. Is ambulating more.  Objective: Vital signs in last 24 hours: Temp:  [97.4 F (36.3 C)-98.9 F (37.2 C)] 97.4 F (36.3 C) (11/10 0511) Pulse Rate:  [70-82] 70  (11/10 0511) Cardiac Rhythm:  [-] Normal sinus rhythm (11/09 2019) Resp:  [18-20] 18  (11/10 0511) BP: (134-137)/(54-58) 137/56 mmHg (11/10 0511) SpO2:  [91 %-93 %] 93 % (11/10 0511) Weight:  [184 lb 9.6 oz (83.734 kg)] 184 lb 9.6 oz (83.734 kg) (11/10 0511)  Pre op weight  79 kg Current Weight  10/07/12 184 lb 9.6 oz (83.734 kg)     Intake/Output from previous day: 11/09 0701 - 11/10 0700 In: 240 [P.O.:240] Out: 200 [Urine:200]   Physical Exam:  Cardiovascular: RRR Pulmonary: Slightly diminished at bases; no rales, wheezes, or rhonchi. Abdomen: Soft, non tender, bowel sounds present. Extremities: Bilateral lower extremity edema. Wounds: Clean and dry.  No erythema or signs of infection.  Lab Results: CBC: No results found for this basename: WBC:2,HGB:2,HCT:2,PLT:2 in the last 72 hours BMET:   [facility] 10/05/12 0545  NA 133*  K 4.0  CL 98  CO2 27  GLUCOSE 125*  BUN 12  CREATININE 0.83  CALCIUM 8.3*    PT/INR:  Lab Results  Component Value Date   INR 1.49 10/01/2012   INR 1.06 09/29/2012   ABG:  INR: Will add last result for INR, ABG once components are confirmed Will add last 4 CBG results once components are confirmed  Assessment/Plan:  1. CV - SR.On Lopressor 12.5 bid and Lotrel 5/10  2.  Pulmonary - Encourage incentive spirometer and will get a flutter valve as well. 3. Volume Overload - On Lasix 40 po daily 4.  Acute blood loss anemia - H and H stable at 9.7 and 29 this am. 5.Hyponatremia-Last sodium is  increased to 133.  6.Pre op HGA1C 6.5. CBGs 157/137/113. On SS insulin. Will need follow up as an outpatient. 7.Discharge today  Jolyn Deshmukh MPA-C 10/07/2012,8:05 AM

## 2012-10-07 NOTE — Progress Notes (Signed)
Patient instructions reviewed with patient and family, all questions answered, prescriptions given. Patient discharged home.

## 2012-10-11 ENCOUNTER — Ambulatory Visit (INDEPENDENT_AMBULATORY_CARE_PROVIDER_SITE_OTHER): Payer: Self-pay | Admitting: Cardiothoracic Surgery

## 2012-10-11 VITALS — BP 116/64 | HR 73 | Resp 16 | Ht 64.0 in | Wt 172.0 lb

## 2012-10-11 DIAGNOSIS — Z09 Encounter for follow-up examination after completed treatment for conditions other than malignant neoplasm: Secondary | ICD-10-CM

## 2012-10-11 DIAGNOSIS — Z951 Presence of aortocoronary bypass graft: Secondary | ICD-10-CM

## 2012-10-11 NOTE — Progress Notes (Signed)
[address]            [place] [address]          [phone]       JHANVI DRAKEFORD [facility] Medical Record #[id number] Date of Birth: [birth year]  Lamar Blinks, MD Yates Decamp, MD  Chief Complaint:   PostOp Follow Up Visit 10/01/2012    OPERATIVE REPORT  PREOPERATIVE DIAGNOSIS: Coronary occlusive disease with unstable  angina.  POSTOPERATIVE DIAGNOSIS: Coronary occlusive disease with unstable  angina.  SURGICAL PROCEDURE: Coronary artery bypass grafting x5 with left  internal mammary to the left anterior descending coronary artery,  reverse saphenous vein graft to the second diagonal, reverse saphenous  vein graft to the first obtuse marginal, sequential reverse saphenous  vein graft to the posterior descending and posterolateral branches of  the right coronary artery. Endovein harvesting.   History of Present Illness:      Patient comes to the office today because of some drainage from the left knee end of vein harvest site. Since discharge she's been doing well without evident of congestive heart failure. Her daughter notes that her ankles are less swollen than they have been for a long time. She is ambulating in the house without difficulty.         History  Smoking status  . Never Smoker   Smokeless tobacco  . Never Used       Allergies  Allergen Reactions  . Neosporin (Neomycin-Bacitracin Zn-Polymyx) Rash    Current Outpatient Prescriptions  Medication Sig Dispense Refill  . amLODipine-benazepril (LOTREL) 5-10 MG per capsule Take 1 capsule by mouth daily.      . Ascorbic Acid (VITAMIN C) 1000 MG tablet Take 1,000 mg by mouth daily.      Marland Kitchen aspirin EC 325 MG EC tablet Take 1 tablet (325 mg total) by mouth daily.  30 tablet    . aspirin-acetaminophen-caffeine (EXCEDRIN MIGRAINE) 250-250-65 MG per tablet Take 1 tablet by mouth every 6 (six) hours as needed. For pain      . atorvastatin (LIPITOR) 20 MG  tablet Take 1 tablet (20 mg total) by mouth daily at 6 PM.  30 tablet  1  . calcium citrate (CALCITRATE - DOSED IN MG ELEMENTAL CALCIUM) 950 MG tablet Take 1 tablet by mouth daily.      Marland Kitchen estrogens, conjugated, (PREMARIN) 1.25 MG tablet Take 1.25 mg by mouth daily.      . furosemide (LASIX) 40 MG tablet Take 1 tablet (40 mg total) by mouth daily. For 7 days then stop.  7 tablet  0  . metoprolol tartrate (LOPRESSOR) 25 MG tablet Take 0.5 tablets (12.5 mg total) by mouth 2 (two) times daily.  30 tablet  1  . potassium chloride SA (K-DUR,KLOR-CON) 20 MEQ tablet Take 1 tablet (20 mEq total) by mouth daily. For 7 days then stop.  7 tablet  0  . traMADol (ULTRAM) 50 MG tablet Take 1 tablet (50 mg total) by mouth every 6 (six) hours as needed for pain.  30 tablet  0  . vitamin E 400 UNIT capsule Take 400 Units by mouth daily.      . Zinc Monomethionine POWD Take 30 mg by mouth daily.           Physical Exam: BP 116/64  Pulse 73  Resp 16  Ht 5\' 4"  (1.626 m)  Wt  172 lb (78.019 kg)  BMI 29.52 kg/m2  SpO2 92%  General appearance: alert, cooperative and appears stated age Neurologic: intact Heart: regular rate and rhythm, S1, S2 normal, no murmur, click, rub or gallop and normal apical impulse Lungs: clear to auscultation bilaterally and normal percussion bilaterally Abdomen: soft, non-tender; bowel sounds normal; no masses,  no organomegaly Extremities: extremities normal, atraumatic, no cyanosis , mild bilateral pedal edema  Wound: Sternal incision is well-healed, there is very slight redness at the right leg endo- vein harvest site but no drainage the left knee end of vein harvest site appears healing well and currently there is no drainage or erythema   Diagnostic Studies & Laboratory data:         Recent Radiology Findings: No results found.    Recent Labs: Lab Results  Component Value Date   WBC 11.3* 10/04/2012   HGB 9.7* 10/04/2012   HCT 29.0* 10/04/2012   PLT 189 10/04/2012    GLUCOSE 125* 10/05/2012   CHOL 171 09/29/2012   TRIG 235* 09/29/2012   HDL 54 09/29/2012   LDLCALC 70 09/29/2012   NA 133* 10/05/2012   K 4.0 10/05/2012   CL 98 10/05/2012   CREATININE 0.83 10/05/2012   BUN 12 10/05/2012   CO2 27 10/05/2012   INR 1.49 10/01/2012   HGBA1C 6.5* 09/30/2012      Assessment / Plan:     Overall the patient appears doing well on the early postop wound check. She will return on December 5 with a followup chest x-ray       Gurtha Picker B 10/11/2012 2:35 PM

## 2012-10-11 NOTE — Patient Instructions (Signed)
No changes

## 2012-10-30 ENCOUNTER — Other Ambulatory Visit: Payer: Self-pay | Admitting: Cardiothoracic Surgery

## 2012-10-30 DIAGNOSIS — Z951 Presence of aortocoronary bypass graft: Secondary | ICD-10-CM

## 2012-10-31 ENCOUNTER — Ambulatory Visit
Admission: RE | Admit: 2012-10-31 | Discharge: 2012-10-31 | Disposition: A | Discharge status: Home / Self Care | Payer: PRIVATE HEALTH INSURANCE | Source: Ambulatory Visit | Attending: Cardiothoracic Surgery | Admitting: Cardiothoracic Surgery

## 2012-10-31 ENCOUNTER — Encounter: Payer: Self-pay | Admitting: Cardiothoracic Surgery

## 2012-10-31 ENCOUNTER — Ambulatory Visit (INDEPENDENT_AMBULATORY_CARE_PROVIDER_SITE_OTHER): Payer: Self-pay | Admitting: Cardiothoracic Surgery

## 2012-10-31 VITALS — BP 130/69 | HR 74 | Resp 16 | Ht 62.0 in | Wt 166.0 lb

## 2012-10-31 DIAGNOSIS — Z951 Presence of aortocoronary bypass graft: Secondary | ICD-10-CM

## 2012-10-31 DIAGNOSIS — I2510 Atherosclerotic heart disease of native coronary artery without angina pectoris: Secondary | ICD-10-CM

## 2012-10-31 NOTE — Progress Notes (Signed)
[address]            [place] [address]          [phone]       HILLARY STRUSS [facility] Medical Record #[id number] Date of Birth: November 05, 1928  Rafael Bihari, MD Yates Decamp, MD  Chief Complaint:   PostOp Follow Up Visit 10/01/2012   OPERATIVE REPORT  PREOPERATIVE DIAGNOSIS: Coronary occlusive disease with unstable  angina.  POSTOPERATIVE DIAGNOSIS: Coronary occlusive disease with unstable  angina.  SURGICAL PROCEDURE: Coronary artery bypass grafting x5 with left  internal mammary to the left anterior descending coronary artery,  reverse saphenous vein graft to the second diagonal, reverse saphenous  vein graft to the first obtuse marginal, sequential reverse saphenous  vein graft to the posterior descending and posterolateral branches of  the right coronary artery. Endovein harvesting.   History of Present Illness:     Patient is doing well following urgent bypass surgery 10/01/2012. Considering her age of 13 years she is making good progress at home. She's had no recurrent angina. She is slowly increasing her physical activity. She has no signs or symptoms of congestive heart failure.          History  Smoking status  . Never Smoker   Smokeless tobacco  . Never Used       Allergies  Allergen Reactions  . Neosporin (Neomycin-Bacitracin Zn-Polymyx) Rash    Current Outpatient Prescriptions  Medication Sig Dispense Refill  . amLODipine-benazepril (LOTREL) 5-10 MG per capsule Take 1 capsule by mouth daily.      . Ascorbic Acid (VITAMIN C) 1000 MG tablet Take 1,000 mg by mouth daily.      Marland Kitchen aspirin EC 325 MG EC tablet Take 1 tablet (325 mg total) by mouth daily.  30 tablet    . atorvastatin (LIPITOR) 20 MG tablet Take 1 tablet (20 mg total) by mouth daily at 6 PM.  30 tablet  1  . calcium citrate (CALCITRATE - DOSED IN MG ELEMENTAL CALCIUM) 950 MG tablet Take 1 tablet by mouth daily.      Marland Kitchen estrogens,  conjugated, (PREMARIN) 1.25 MG tablet Take 1.25 mg by mouth daily.      . metoprolol tartrate (LOPRESSOR) 25 MG tablet Take 0.5 tablets (12.5 mg total) by mouth 2 (two) times daily.  30 tablet  1  . vitamin E 400 UNIT capsule Take 400 Units by mouth daily.      . Zinc Monomethionine POWD Take 30 mg by mouth daily.      Marland Kitchen aspirin-acetaminophen-caffeine (EXCEDRIN MIGRAINE) 250-250-65 MG per tablet Take 1 tablet by mouth every 6 (six) hours as needed. For pain      . traMADol (ULTRAM) 50 MG tablet Take 1 tablet (50 mg total) by mouth every 6 (six) hours as needed for pain.  30 tablet  0       Physical Exam: BP 130/69  Pulse 74  Resp 16  Ht 5\' 2"  (1.575 m)  Wt 166 lb (75.297 kg)  BMI 30.36 kg/m2  SpO2 94%  General appearance: alert and cooperative Neurologic: intact Heart: regular rate and rhythm, S1, S2 normal, no murmur, click, rub or gallop Lungs: clear to auscultation bilaterally and normal percussion bilaterally Abdomen: soft, non-tender; bowel sounds normal; no masses,  no organomegaly Extremities: extremities normal, atraumatic, no cyanosis or edema and Homans  sign is negative, no sign of DVT Wound: Sternum is stable and well healed, her vein harvest sites in both thighs are healing well she has chronic bilateral ankle edema which is unchanged   Diagnostic Studies & Laboratory data:         Recent Radiology Findings: Dg Chest 2 View  10/31/2012  *RADIOLOGY REPORT*  Clinical Data: History of CABG on 10/01/2012, follow-up, some chest pain  CHEST - 2 VIEW  Comparison: Portable chest x-ray of 10/03/2012  Findings: No active infiltrate or effusion is seen.  Minimal linear atelectasis or scarring is noted at the lung bases.  Cardiomegaly is stable.  Median sternotomy sutures appear intact.  No bony abnormality is seen.  IMPRESSION: No active lung disease.  Stable mild cardiomegaly.   Original Report Authenticated By: Dwyane Dee, M.D.       Recent Labs: Lab Results  Component Value  Date   WBC 11.3* 10/04/2012   HGB 9.7* 10/04/2012   HCT 29.0* 10/04/2012   PLT 189 10/04/2012   GLUCOSE 125* 10/05/2012   CHOL 171 09/29/2012   TRIG 235* 09/29/2012   HDL 54 09/29/2012   LDLCALC 70 09/29/2012   NA 133* 10/05/2012   K 4.0 10/05/2012   CL 98 10/05/2012   CREATININE 0.83 10/05/2012   BUN 12 10/05/2012   CO2 27 10/05/2012   INR 1.49 10/01/2012   HGBA1C 6.5* 09/30/2012      Assessment / Plan:      Overall am pleased with her postoperative progress following coronary artery bypass grafting approximately 4 weeks. She is to start in the cardiac rehabilitation program at [facility] in the coming week. I plan to see her back in approximately one month      Errik Mitchelle B 10/31/2012 3:59 PM

## 2012-10-31 NOTE — Patient Instructions (Signed)
No lifting Start Cardiac Rehab

## 2012-11-01 ENCOUNTER — Ambulatory Visit: Payer: PRIVATE HEALTH INSURANCE | Admitting: Cardiothoracic Surgery

## 2012-11-06 ENCOUNTER — Encounter: Payer: Self-pay | Admitting: Internal Medicine

## 2012-11-28 ENCOUNTER — Encounter: Payer: Self-pay | Admitting: Internal Medicine

## 2012-12-06 ENCOUNTER — Ambulatory Visit (INDEPENDENT_AMBULATORY_CARE_PROVIDER_SITE_OTHER): Payer: Self-pay | Admitting: Cardiothoracic Surgery

## 2012-12-06 ENCOUNTER — Encounter: Payer: Self-pay | Admitting: Cardiothoracic Surgery

## 2012-12-06 VITALS — BP 125/68 | HR 67 | Resp 16 | Ht 62.0 in | Wt 166.0 lb

## 2012-12-06 DIAGNOSIS — I2510 Atherosclerotic heart disease of native coronary artery without angina pectoris: Secondary | ICD-10-CM

## 2012-12-06 DIAGNOSIS — Z951 Presence of aortocoronary bypass graft: Secondary | ICD-10-CM

## 2012-12-06 NOTE — Progress Notes (Signed)
[address]            [place] [address]          [phone]         AYESHIA COPPIN [facility] Medical Record #[id number] Date of Birth: 1928-03-03  Lamar Blinks, MD Yates Decamp, MD  Chief Complaint:   PostOp Follow Up Visit 10/01/2012   OPERATIVE REPORT  PREOPERATIVE DIAGNOSIS: Coronary occlusive disease with unstable  angina.  POSTOPERATIVE DIAGNOSIS: Coronary occlusive disease with unstable  angina.  SURGICAL PROCEDURE: Coronary artery bypass grafting x5 with left  internal mammary to the left anterior descending coronary artery,  reverse saphenous vein graft to the second diagonal, reverse saphenous  vein graft to the first obtuse marginal, sequential reverse saphenous  vein graft to the posterior descending and posterolateral branches of  the right coronary artery. Endovein harvesting.   History of Present Illness:     Patient is doing well following urgent bypass surgery 10/01/2012. Considering her age of 16 years she is making good progress at home. She's had no recurrent angina. She is slowly increasing her physical activity. She has no signs or symptoms of congestive heart failure.  She continues to gain strength, came to office on owen today         History  Smoking status  . Never Smoker   Smokeless tobacco  . Never Used       Allergies  Allergen Reactions  . Neosporin (Neomycin-Bacitracin Zn-Polymyx) Rash    Current Outpatient Prescriptions  Medication Sig Dispense Refill  . amLODipine-benazepril (LOTREL) 5-10 MG per capsule Take 1 capsule by mouth daily.      . Ascorbic Acid (VITAMIN C) 1000 MG tablet Take 1,000 mg by mouth daily.      Marland Kitchen aspirin EC 325 MG EC tablet Take 1 tablet (325 mg total) by mouth daily.  30 tablet    . aspirin-acetaminophen-caffeine (EXCEDRIN MIGRAINE) 250-250-65 MG per tablet Take 1 tablet by mouth every 6 (six) hours as needed. For pain      .  atorvastatin (LIPITOR) 20 MG tablet Take 1 tablet (20 mg total) by mouth daily at 6 PM.  30 tablet  1  . calcium citrate (CALCITRATE - DOSED IN MG ELEMENTAL CALCIUM) 950 MG tablet Take 1 tablet by mouth daily.      Marland Kitchen estrogens, conjugated, (PREMARIN) 1.25 MG tablet Take 1.25 mg by mouth daily.      . metoprolol tartrate (LOPRESSOR) 25 MG tablet Take 0.5 tablets (12.5 mg total) by mouth 2 (two) times daily.  30 tablet  1  . traMADol (ULTRAM) 50 MG tablet Take 1 tablet (50 mg total) by mouth every 6 (six) hours as needed for pain.  30 tablet  0  . vitamin E 400 UNIT capsule Take 400 Units by mouth daily.      . Zinc Monomethionine POWD Take 30 mg by mouth daily.           Physical Exam: BP 125/68  Pulse 67  Resp 16  Ht 5\' 2"  (1.575 m)  Wt 166 lb (75.297 kg)  BMI 30.36 kg/m2  SpO2 94%  General appearance: alert and cooperative Neurologic: intact Heart: regular rate and rhythm, S1, S2 normal, no murmur, click, rub or gallop Lungs: clear  to auscultation bilaterally and normal percussion bilaterally Abdomen: soft, non-tender; bowel sounds normal; no masses,  no organomegaly Extremities: extremities normal, atraumatic, no cyanosis or edema and Homans sign is negative, no sign of DVT Wound: Sternum is stable and well healed, her vein harvest sites in both thighs are healing well she has chronic bilateral ankle edema which is unchanged   Diagnostic Studies & Laboratory data:         Recent Radiology Findings: No results found.    Recent Labs: Lab Results  Component Value Date   WBC 11.3* 10/04/2012   HGB 9.7* 10/04/2012   HCT 29.0* 10/04/2012   PLT 189 10/04/2012   GLUCOSE 125* 10/05/2012   CHOL 171 09/29/2012   TRIG 235* 09/29/2012   HDL 54 09/29/2012   LDLCALC 70 09/29/2012   NA 133* 10/05/2012   K 4.0 10/05/2012   CL 98 10/05/2012   CREATININE 0.83 10/05/2012   BUN 12 10/05/2012   CO2 27 10/05/2012   INR 1.49 10/01/2012   HGBA1C 6.5* 09/30/2012      Assessment / Plan:        Overall am pleased with her postoperative progress following coronary artery bypass grafting approximately 4 weeks. She started  in the cardiac rehabilitation program at [facility] and doing well  I plan to see her back prn     Tyreck Bell B 12/06/2012 11:13 AM

## 2012-12-06 NOTE — Patient Instructions (Addendum)
Coronary Artery Bypass Grafting Care After Refer to this sheet in the next few weeks. These instructions provide you with information on caring for yourself after your procedure. Your caregiver may also give you more specific instructions. Your treatment has been planned according to current medical practices, but problems sometimes occur. Call your caregiver if you have any problems or questions after your procedure.  Recovery from open heart surgery will be different for everyone. Some people feel well after 3 or 4 weeks, while for others it takes longer. After heart surgery, it may be normal to:  Not have an appetite, feel nauseated by the smell of food, or only want to eat a small amount.  Be constipated because of changes in your diet, activity, and medicines. Eat foods high in fiber. Add fresh fruits and vegetables to your diet. Stool softeners may be helpful.  Feel sad or unhappy. You may be frustrated or cranky. You may have good days and bad days. Do not give up. Talk to your caregiver if you do not feel better.  Feel weakness and fatigue. You many need physical therapy or cardiac rehabilitation to get your strength back.  Develop an irregular heartbeat called atrial fibrillation. Symptoms of atrial fibrillation are a fast, irregular heartbeat or feelings of fluttery heartbeats, shortness of breath, low blood pressure, and dizziness. If these symptoms develop, see your caregiver right away. MEDICATION  Have a list of all the medicines you will be taking when you leave the hospital. For every medicine, know the following:  Name.  Exact dose.  Time of day to be taken.  How often it should be taken.  Why you are taking it.  Ask which medicines should or should not be taken together. If you take more than one heart medicine, ask if it is okay to take them together. Some heart medicines should not be taken at the same time because they may lower your blood pressure too  much.  Narcotic pain medicine can cause constipation. Eat fresh fruits and vegetables. Add fiber to your diet. Stool softener medicine may help relieve constipation.  Keep a copy of your medicines with you at all times.  Do not add or stop taking any medicine until you check with your caregiver.  Medicines can have side effects. Call your caregiver who prescribed the medicine if you:  Start throwing up, have diarrhea, or have stomach pain.  Feel dizzy or lightheaded when you stand up.  Feel your heart is skipping beats or is beating too fast or too slow.  Develop a rash.  Notice unusual bruising or bleeding. HOME CARE INSTRUCTIONS  After heart surgery, it is important to learn how to take your pulse. Have your caregiver show you how to take your pulse.  Use your incentive spirometer. Ask your caregiver how long after surgery you need to use it. Care of your chest incision  Tell your caregiver right away if you notice clicking in your chest (sternum).  Support your chest with a pillow or your arms when you take deep breaths and cough.  Follow your caregiver's instructions about when you can bathe or swim.  Protect your incision from sunlight during the first year to keep the scar from getting dark.  Tell your caregiver if you notice:  Increased tenderness of your incision.  Increased redness or swelling around your incision.  Drainage or pus from your incision. Care of your leg incision(s)  Avoid crossing your legs.  Avoid sitting for long periods of   time. Change positions every half hour.  Elevate your leg(s) when you are sitting.  Check your leg(s) daily for swelling. Check the incisions for redness or drainage.  Wear your elastic stockings as told by your caregiver. Take them off at bedtime. Diet  Diet is very important to heart health.  Eat plenty of fresh fruits and vegetables. Meats should be lean cut. Avoid canned, processed, and fried foods.  Talk to a  dietician. They can teach you how to make healthy food and drink choices. Weight  Weigh yourself every day. This is important because it helps to know if you are retaining fluid that may make your heart and lungs work harder.  Use the same scale each time.  Weigh yourself every morning at the same time. You should do this after you go to the bathroom, but before you eat breakfast.  Your weight will be more accurate if you do not wear any clothes.  Record your weight.  Tell your caregiver if you have gained 2 pounds or more overnight. Activity Stop any activity at once if you have chest pain, shortness of breath, irregular heartbeats, or dizziness. Get help right away if you have any of these symptoms.  Bathing.  Avoid soaking in a bath or hot tub until your incisions are healed.  Rest. You need a balance of rest and activity.  Exercise. Exercise per your caregiver's advice. You may need physical therapy or cardiac rehabilitation to help strengthen your muscles and build your endurance.  Climbing stairs. Unless your caregiver tells you not to climb stairs, go up stairs slowly and rest if you tire. Do not pull yourself up by the handrail.  Driving a car. Follow your caregiver's advice on when you may drive. You may ride as a passenger at any time. When traveling for long periods of time in a car, get out of the car and walk around for a few minutes every 2 hours.  Lifting. Avoid lifting, pushing, or pulling anything heavier than 10 pounds for 6 weeks after surgery or as told by your caregiver.  Returning to work. Check with your caregiver. People heal at different rates. Most people will be able to go back to work 6 to 12 weeks after surgery.  Sexual activity. You may resume sexual relations as told by your caregiver. SEEK MEDICAL CARE IF:  Any of your incisions are red, painful, or have any type of drainage coming from them.  You have an oral temperature above 102 F (38.9  C).  You have ankle or leg swelling.  You have pain in your legs.  You have weight gain of 2 or more pounds a day.  You feel dizzy or lightheaded when you stand up. SEEK IMMEDIATE MEDICAL CARE IF:  You have angina or chest pain that goes to your jaw or arms. Call your local emergency services right away.  You have shortness of breath at rest or with activity.  You have a fast or irregular heartbeat (arrhythmia).  There is a "clicking" in your sternum when you move.  You have numbness or weakness in your arms or legs. MAKE SURE YOU:  Understand these instructions.  Will watch your condition.  Will get help right away if you are not doing well or get worse. Document Released: 06/03/2005 Document Revised: 02/06/2012 Document Reviewed: 01/19/2011 [employer] Patient Information 2013 [employer].  

## 2012-12-09 ENCOUNTER — Other Ambulatory Visit: Payer: Self-pay | Admitting: Physician Assistant

## 2012-12-29 ENCOUNTER — Encounter: Payer: Self-pay | Admitting: Internal Medicine

## 2013-01-26 ENCOUNTER — Encounter: Payer: Self-pay | Admitting: Internal Medicine

## 2013-02-18 ENCOUNTER — Ambulatory Visit: Payer: Self-pay | Admitting: Physical Medicine and Rehabilitation

## 2013-05-24 ENCOUNTER — Encounter: Payer: Self-pay | Admitting: Physical Medicine and Rehabilitation

## 2013-06-01 ENCOUNTER — Inpatient Hospital Stay: Payer: Self-pay | Admitting: Internal Medicine

## 2013-06-01 LAB — BASIC METABOLIC PANEL
Anion Gap: 9 (ref 7–16)
BUN: 11 mg/dL (ref 7–18)
Calcium, Total: 8 mg/dL — ABNORMAL LOW (ref 8.5–10.1)
Chloride: 107 mmol/L (ref 98–107)
Creatinine: 1.22 mg/dL (ref 0.60–1.30)
EGFR (Non-African Amer.): 41 — ABNORMAL LOW
Glucose: 157 mg/dL — ABNORMAL HIGH (ref 65–99)
Potassium: 4.2 mmol/L (ref 3.5–5.1)
Sodium: 139 mmol/L (ref 136–145)

## 2013-06-01 LAB — COMPREHENSIVE METABOLIC PANEL
Albumin: 3 g/dL — ABNORMAL LOW (ref 3.4–5.0)
Anion Gap: 11 (ref 7–16)
BUN: 13 mg/dL (ref 7–18)
Co2: 23 mmol/L (ref 21–32)
Glucose: 146 mg/dL — ABNORMAL HIGH (ref 65–99)
Osmolality: 278 (ref 275–301)
Potassium: 4.4 mmol/L (ref 3.5–5.1)
SGOT(AST): 29 U/L (ref 15–37)
Sodium: 138 mmol/L (ref 136–145)

## 2013-06-01 LAB — CBC
HCT: 38.4 % (ref 35.0–47.0)
MCH: 31.4 pg (ref 26.0–34.0)
MCV: 92 fL (ref 80–100)
RBC: 4.17 10*6/uL (ref 3.80–5.20)
RDW: 13.3 % (ref 11.5–14.5)
WBC: 14 10*3/uL — ABNORMAL HIGH (ref 3.6–11.0)

## 2013-06-01 LAB — URINALYSIS, COMPLETE
Blood: NEGATIVE
Nitrite: NEGATIVE
Ph: 5 (ref 4.5–8.0)

## 2013-06-01 LAB — APTT: Activated PTT: 29.2 secs (ref 23.6–35.9)

## 2013-06-01 LAB — PROTIME-INR
INR: 1.1
Prothrombin Time: 14.7 secs (ref 11.5–14.7)

## 2013-06-02 LAB — COMPREHENSIVE METABOLIC PANEL
Albumin: 2.5 g/dL — ABNORMAL LOW (ref 3.4–5.0)
Alkaline Phosphatase: 69 U/L (ref 50–136)
Bilirubin,Total: 0.5 mg/dL (ref 0.2–1.0)
Calcium, Total: 7.6 mg/dL — ABNORMAL LOW (ref 8.5–10.1)
Creatinine: 1.09 mg/dL (ref 0.60–1.30)
EGFR (African American): 55 — ABNORMAL LOW
EGFR (Non-African Amer.): 47 — ABNORMAL LOW
Glucose: 127 mg/dL — ABNORMAL HIGH (ref 65–99)
Osmolality: 282 (ref 275–301)
Potassium: 4 mmol/L (ref 3.5–5.1)
SGPT (ALT): 15 U/L (ref 12–78)
Sodium: 141 mmol/L (ref 136–145)
Total Protein: 5.8 g/dL — ABNORMAL LOW (ref 6.4–8.2)

## 2013-06-02 LAB — CBC WITH DIFFERENTIAL/PLATELET
Basophil #: 0.1 10*3/uL (ref 0.0–0.1)
Eosinophil #: 0.1 10*3/uL (ref 0.0–0.7)
Eosinophil %: 0.6 %
HCT: 32 % — ABNORMAL LOW (ref 35.0–47.0)
HGB: 11 g/dL — ABNORMAL LOW (ref 12.0–16.0)
Lymphocyte #: 2.7 10*3/uL (ref 1.0–3.6)
Lymphocyte %: 21.8 %
MCH: 31.7 pg (ref 26.0–34.0)
MCV: 92 fL (ref 80–100)
Monocyte %: 12.7 %
Neutrophil #: 8.1 10*3/uL — ABNORMAL HIGH (ref 1.4–6.5)
Neutrophil %: 64.4 %
Platelet: 212 10*3/uL (ref 150–440)
RBC: 3.47 10*6/uL — ABNORMAL LOW (ref 3.80–5.20)
WBC: 12.5 10*3/uL — ABNORMAL HIGH (ref 3.6–11.0)

## 2013-06-02 LAB — APTT
Activated PTT: 123.3 secs — ABNORMAL HIGH (ref 23.6–35.9)
Activated PTT: 85.8 secs — ABNORMAL HIGH (ref 23.6–35.9)

## 2013-06-03 LAB — CBC WITH DIFFERENTIAL/PLATELET
Basophil #: 0.1 10*3/uL (ref 0.0–0.1)
Basophil #: 0.1 10*3/uL (ref 0.0–0.1)
Eosinophil #: 0.3 10*3/uL (ref 0.0–0.7)
Eosinophil #: 0.3 10*3/uL (ref 0.0–0.7)
HCT: 34.4 % — ABNORMAL LOW (ref 35.0–47.0)
HGB: 11.5 g/dL — ABNORMAL LOW (ref 12.0–16.0)
Lymphocyte #: 2.8 10*3/uL (ref 1.0–3.6)
Lymphocyte %: 24.7 %
MCH: 31.8 pg (ref 26.0–34.0)
MCHC: 33.5 g/dL (ref 32.0–36.0)
MCHC: 32.7 g/dL (ref 32.0–36.0)
Monocyte #: 1.3 x10 3/mm — ABNORMAL HIGH (ref 0.2–0.9)
Monocyte %: 9.4 %
Monocyte %: 12.2 %
Neutrophil #: 5.8 10*3/uL (ref 1.4–6.5)
Neutrophil %: 63.1 %
Neutrophil %: 55.9 %
RBC: 3.62 10*6/uL — ABNORMAL LOW (ref 3.80–5.20)
RDW: 13.4 % (ref 11.5–14.5)
WBC: 11.2 10*3/uL — ABNORMAL HIGH (ref 3.6–11.0)
WBC: 10.4 10*3/uL (ref 3.6–11.0)

## 2013-06-03 LAB — BASIC METABOLIC PANEL
Anion Gap: 8 (ref 7–16)
Calcium, Total: 7.8 mg/dL — ABNORMAL LOW (ref 8.5–10.1)
Chloride: 110 mmol/L — ABNORMAL HIGH (ref 98–107)
Creatinine: 0.99 mg/dL (ref 0.60–1.30)
EGFR (African American): >60
EGFR (Non-African Amer.): 53 — ABNORMAL LOW
Glucose: 115 mg/dL — ABNORMAL HIGH (ref 65–99)
Osmolality: 280 (ref 275–301)
Potassium: 4 mmol/L (ref 3.5–5.1)

## 2013-06-03 LAB — APTT: Activated PTT: 73.5 secs — ABNORMAL HIGH (ref 23.6–35.9)

## 2013-06-04 LAB — CBC WITH DIFFERENTIAL/PLATELET
Eosinophil #: 0.3 10*3/uL (ref 0.0–0.7)
HCT: 33.5 % — ABNORMAL LOW (ref 35.0–47.0)
MCH: 31.4 pg (ref 26.0–34.0)
MCHC: 33.5 g/dL (ref 32.0–36.0)
MCV: 94 fL (ref 80–100)
Monocyte %: 10.4 %
Neutrophil %: 60.2 %
WBC: 8.4 10*3/uL (ref 3.6–11.0)

## 2013-06-04 LAB — BASIC METABOLIC PANEL
Anion Gap: 7 (ref 7–16)
BUN: 5 mg/dL — ABNORMAL LOW (ref 7–18)
Chloride: 112 mmol/L — ABNORMAL HIGH (ref 98–107)
Co2: 22 mmol/L (ref 21–32)
Creatinine: 0.98 mg/dL (ref 0.60–1.30)
EGFR (Non-African Amer.): 54 — ABNORMAL LOW
Osmolality: 281 (ref 275–301)
Sodium: 141 mmol/L (ref 136–145)

## 2013-06-04 LAB — APTT: Activated PTT: 62.4 secs — ABNORMAL HIGH (ref 23.6–35.9)

## 2013-06-05 LAB — BASIC METABOLIC PANEL
Anion Gap: 6 — ABNORMAL LOW (ref 7–16)
Calcium, Total: 8.3 mg/dL — ABNORMAL LOW (ref 8.5–10.1)
Chloride: 110 mmol/L — ABNORMAL HIGH (ref 98–107)
EGFR (African American): >60
Glucose: 125 mg/dL — ABNORMAL HIGH (ref 65–99)
Potassium: 4 mmol/L (ref 3.5–5.1)
Sodium: 140 mmol/L (ref 136–145)

## 2013-06-05 LAB — HEMATOCRIT: HCT: 33 % — ABNORMAL LOW (ref 35.0–47.0)

## 2013-11-01 ENCOUNTER — Ambulatory Visit: Payer: Self-pay | Admitting: Unknown Physician Specialty

## 2013-12-16 IMAGING — CR DG CHEST 1V PORT
1 series · 1 of 1 positions shown · non-contrast
Comparison: None.

CLINICAL DATA: Preoperative evaluation, coronary disease

PORTABLE CHEST - 1 VIEW

[AP]
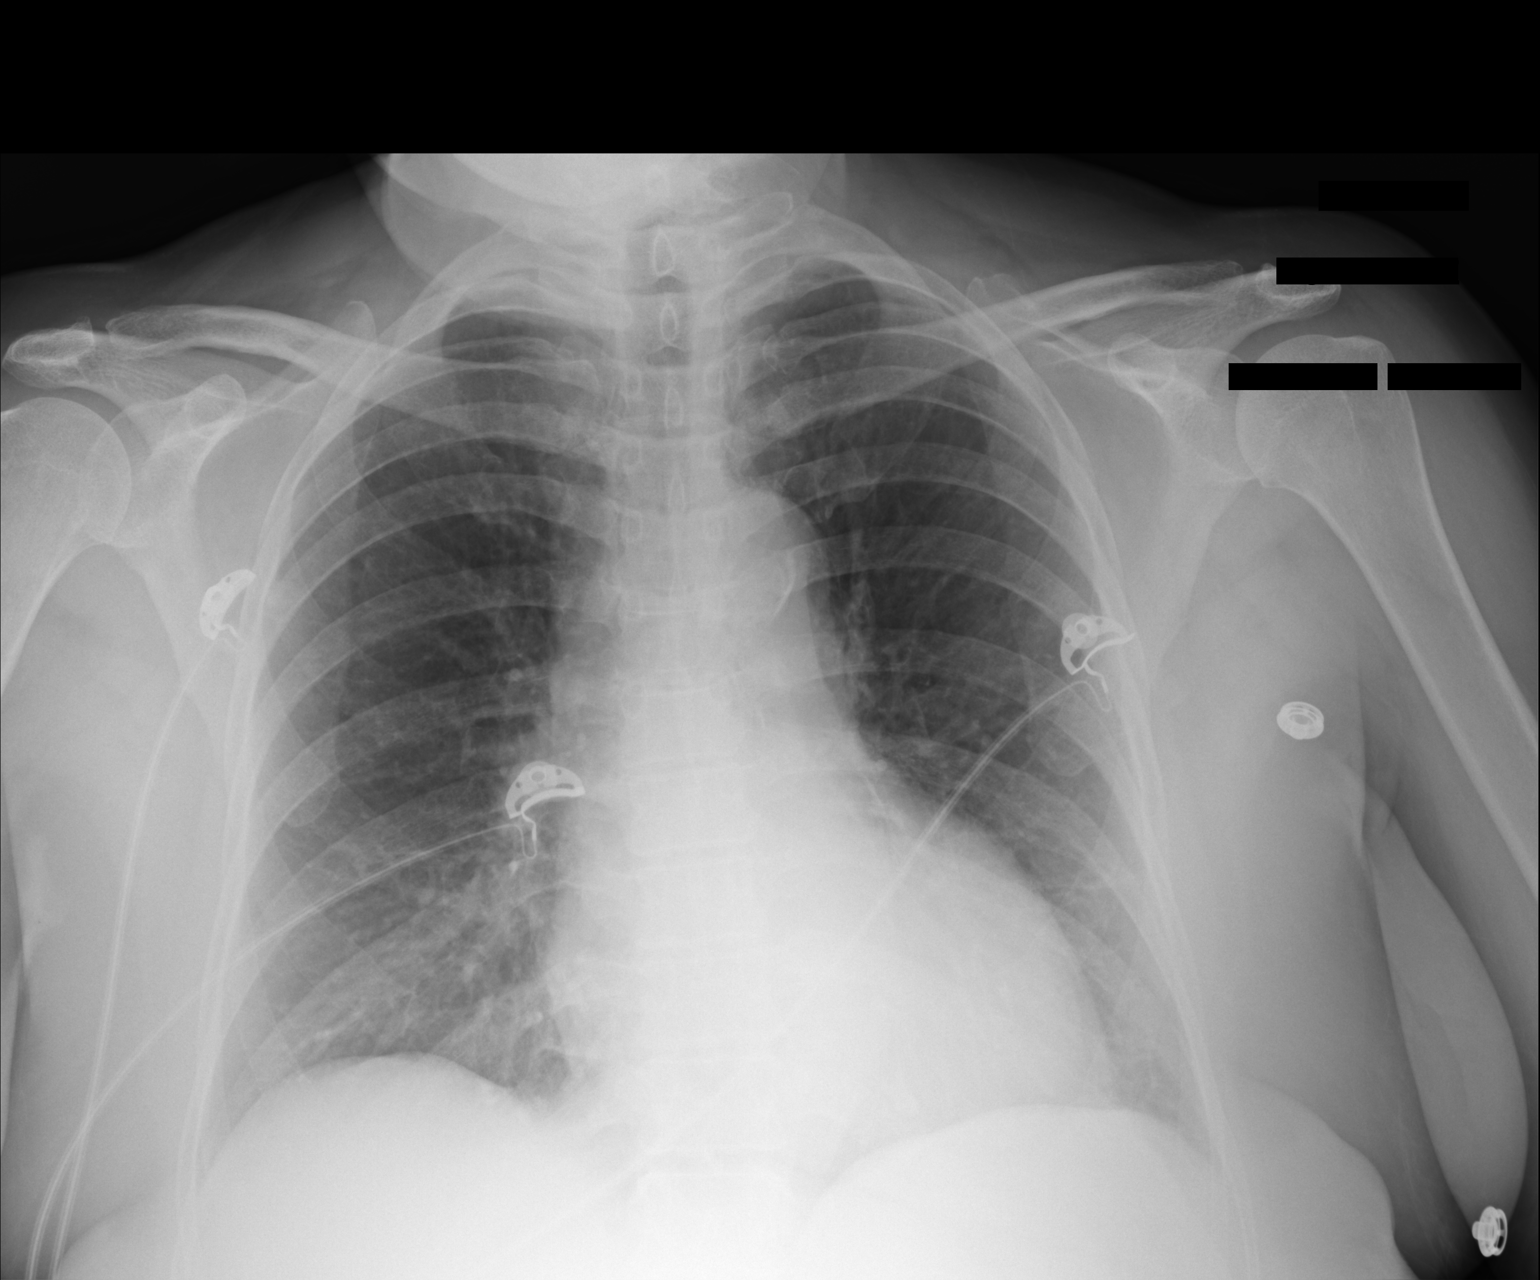

[1 of 1 positions shown; findings below may reference images not displayed]

FINDINGS: Normal heart size with slight vascular prominence.
Negative for CHF, pneumonia, collapse, consolidation, effusion or
pneumothorax.  Trachea midline.  Atherosclerosis of the aorta.
IMPRESSION: No acute chest finding

## 2014-01-29 ENCOUNTER — Ambulatory Visit: Payer: Self-pay | Admitting: Internal Medicine

## 2014-07-15 ENCOUNTER — Ambulatory Visit: Payer: Self-pay | Admitting: Internal Medicine

## 2015-03-11 ENCOUNTER — Ambulatory Visit
Admit: 2015-03-11 | Disposition: A | Discharge status: Home / Self Care | Payer: Self-pay | Attending: Physician Assistant | Admitting: Physician Assistant

## 2015-03-17 NOTE — Discharge Summary (Signed)
PATIENT NAME:  Kimberly Scott, Kimberly Scott MR#:  [id number] DATE OF BIRTH:  [birth year]  DATE OF ADMISSION:  09/27/2012 DATE OF DISCHARGE:  09/28/2012  DISCHARGE DIAGNOSES:  1. Unstable angina.  2. Hypertension.  3. Hyperlipidemia.  4. Diabetes mellitus.  5. Critical coronary disease.   HOSPITAL COURSE: The patient is an 81 year old female with significant progression of chest pain and shortness of breath with an abnormal stress test. The patient underwent cardiac catheterization showing critical three-vessel coronary artery disease with chest pain on the table, needing further medical management and treatment options. The patient underwent a nitroglycerin patch with other medication management including beta blocker for heart rate and hypertension control. The patient also remained on aspirin. She was needing further intervention, including coronary artery bypass surgery, and then was transferred to [facility] for further intervention including coronary artery bypass grafts.  Further follow-up thereafter.  ____________________________ Corey Skains, MD bjk:cbb D: 10/02/2012 07:18:17 ET T: 10/02/2012 11:13:27 ET JOB#: [id number]  cc: Corey Skains, MD, <Dictator> Corey Skains MD ELECTRONICALLY SIGNED 10/04/2012 13:45

## 2015-03-20 NOTE — Discharge Summary (Signed)
PATIENT NAME:  Kimberly Scott, PROCIDA MR#:  [id number] DATE OF BIRTH:  [birth year]  DATE OF ADMISSION:  06/01/2013 DATE OF DISCHARGE:    FINAL DIAGNOSES:  1.  Ischemic colitis.  2.  Gastrointestinal bleeding secondary to #1.  3.  Acute blood loss anemia secondary to #1.  4.  Coronary artery disease with prior bypass surgery.  5.  Hypertension.  6.  Osteoarthritis.   HISTORY AND PHYSICAL: Please see dictated admission history and physical.   HOSPITAL COURSE: The patient was admitted with left lower quadrant abdominal pain of sudden onset. This was associated with nausea and vomiting. She underwent CT scan, which revealed evidence of inflammation of the colon coming from the splenic flexure down on the left side, in  an area that would be most consistent with ischemic colitis. She had no fevers or chills to indicate infection and was not having diarrhea. She was originally seen by vascular surgery, who agreed with this diagnosis. She was placed on heparin and IV fluids. She was subsequently seen by gastroenterology and by vascular surgery. By that time, her pain had improved significantly. She did have some bloody stools, as would be expected with ischemic colitis. She was initially placed on Cipro and Flagyl; however, as this diagnosis became more clear, these antibiotics were stopped and she had no troubles coming off of them. She was noted to have leukocytosis originally, and this improved as her abdominal pain improved.   She underwent CT angiogram, which did reveal a question of some stenosis at the origin of the of the superior mesenteric and inferior mesenteric; however, no significant obstruction was seen elsewhere. Vascular surgery evaluated films and they did not feel that the patient had ongoing obstruction. No CT scan was performed as the patient had been on heparin and hydration for over 48 hours. At that time, her pain had resolved.   She was switched over to aspirin and Plavix after  discussing with vascular surgery. Her diet was advanced. Her bloody stools resolved and her hematocrit stabilized. She ambulated well and felt ready to go home, so at this point, she will be discharged home in stable condition with physical activity to be up as tolerated. Will anticipate her following up Dr. Gilford Rile in the next 2 weeks. She should follow a low-sodium, low-fat, low-cholesterol diet, and additionally was advised to keep this is a bland diet over the next 1 week.   DISCHARGE MEDICATIONS:  1.  Vitamin D 1000 units p.o. daily.  2.  Amlodipine/benazepril 5/10 mg 1 p.o. daily.  3.  Atorvastatin 20 mg p.o. at bedtime.  4.  Calcium 500 mg p.o. daily, to be held for the next 2 weeks and then started.  5.  Aspirin 81 mg p.o. daily.  6.  Clopidogrel 75 mg p.o. daily.  7.  Pantoprazole 40 mg p.o. daily for gastric protection.  8.  Colace 100 mg p.o. daily, hold for loose stools.   The patient was given instructions to hold vitamin C and vitamin E. Additionally, it is recommended that she stop Premarin given, what appears to be, a vascular event. She should avoid Advil and Aleve while taking the aspirin and clopidogrel. She may use Tylenol as needed for pain or fever.   ____________________________ Adin Hector, MD bjk:aw D: 06/05/2013 07:39:06 ET T: 06/05/2013 08:00:50 ET JOB#: [id number]  cc: Adin Hector, MD, <Dictator> Ramonita Lab MD ELECTRONICALLY SIGNED 06/20/2013 22:15

## 2015-03-20 NOTE — Consult Note (Signed)
CC ischemic colitis.  I  noticed she had some report of bleeding.  This occured only once.  She says pain is much better today.  I advised a low roughage diet when she goeshome and explained it to her and family member.  Even though mesenteric arteries are  not blocked there could have been clots and plaque that broke off and caused obstruction and ischemia.  Half of disease is also small vessel disease as well.  Check with vascular to see how long to run the heparin for.  Electronic Signatures: Manya Silvas (MD)  (Signed on 08-Jul-14 17:54)  Authored  Last Updated: 08-Jul-14 17:54 by Manya Silvas (MD)

## 2015-03-20 NOTE — Consult Note (Signed)
PATIENT NAME:  Kimberly Scott, Kimberly Scott MR#:  [id number] DATE OF BIRTH:  November 25, 1928  DATE OF CONSULTATION:  06/01/2013  REFERRING PHYSICIAN:   CONSULTING PHYSICIAN:  Jerrol Banana. Burt Knack, MD  CHIEF COMPLAINT:  Left-sided abdominal pain.   HISTORY OF PRESENT ILLNESS:  This is an 81 year old female patient with a history of coronary artery disease.  She has had a bypass graft, possibly 4 or 5 vessels, performed last year.  She is not on chronic anticoagulation.  She presents with one day history of abdominal pain and she has never had an episode like this before.  She has had no melena or hematochezia and a work-up in the Emergency Room suggests ischemic colitis secondary to vascular disease on a noncontrast study.  She does have calcifications in both the celiac and SMA branches.  I was asked to see the patient as she is being admitted for hydration anticoagulation.   PAST MEDICAL HISTORY:  1.  Coronary artery disease.  2.  Hypertension.  3.  Arthritis.   PAST SURGICAL HISTORY:  Coronary artery bypass graft, appendectomy, knee replacements, multiple skin cancers, not sure if these were basal cell carcinomas or something else, mostly on the face.   FAMILY HISTORY:  Noncontributory.   MEDICATIONS:  Multiple, see chart.   SOCIAL HISTORY:  The patient does not smoke or drink.   REVIEW OF SYSTEMS:  A 10 system review is performed and negative with the exception of that mentioned in the HPI.   PHYSICAL EXAMINATION: GENERAL:  Healthy-appearing comfortable female patient.  VITAL SIGNS:  Stable.  She is afebrile.  HEENT:  Shows no scleral icterus.  No palpable neck nodes.  Multiple facial scars are noted.  CHEST:  Clear to auscultation.  CARDIAC:  Regular rate and rhythm.  ABDOMEN:  Soft, nondistended, minimally tender to the left of midline without rebound or percussion tenderness.  EXTREMITIES:  Without edema.  NEUROLOGIC:  Grossly intact.  INTEGUMENT:  Shows no jaundice.   LABORATORY, DIAGNOSTIC  AND RADIOLOGICAL DATA:  CT scan is personally reviewed suggesting left-sided colitis.  Noncontrast study.    Creatinine is 1.22, lactic acid is 2.2.  White blood cell count is 14.0, H and H  of 13 and 38 with a platelet count of 246.   ASSESSMENT AND PLAN:  This is a patient with probable ischemic colitis.  She has been started on IV heparin and I am in agreement with this plan.  She needs fairly aggressive hydration and I have spoken to both Dr. Jimmye Norman in the Emergency Room and the admitting internist with [facility] and I will be happy to follow the patient while in the hospital.     ____________________________ Jerrol Banana. Burt Knack, MD rec:ea D: 06/02/2013 00:12:46 ET T: 06/02/2013 00:41:46 ET JOB#: [id number]  cc: Jerrol Banana. Burt Knack, MD, <Dictator> Florene Glen MD ELECTRONICALLY SIGNED 06/02/2013 19:37

## 2015-03-20 NOTE — Consult Note (Signed)
Pt  CC ischemic colitis.  Pt feeling much better.  i do not think the diagnosis is in doubt and will sign off.  REconsult if needed.  Electronic Signatures: Manya Silvas (MD)  (Signed on 07-Jul-14 18:08)  Authored  Last Updated: 07-Jul-14 18:08 by Manya Silvas (MD)

## 2015-03-20 NOTE — Consult Note (Signed)
Brief Consult Note: Diagnosis: ischemic colitis.   Patient was seen by consultant.   Consult note dictated.   Recommend further assessment or treatment.   Discussed with Attending MD.   Comments: rec anticoag (already started) and hydration to improve blood flow. angio on Monday once vasc consult avail.  Electronic Signatures: Florene Glen (MD)  (Signed 05-Jul-14 21:04)  Authored: Brief Consult Note   Last Updated: 05-Jul-14 21:04 by Florene Glen (MD)

## 2015-03-20 NOTE — Consult Note (Signed)
CHIEF COMPLAINT and HISTORY:  Subjective/Chief Complaint LLQ pain   History of Present Illness Patient is an 81 yo WF who was admitted with worsening abdominal pain.  Started last week.  no prior episodes like this.  no previous unintentional weight loss, food fear, or post prandial abdominal pain.  Pain is better today, but still present.  No po intake to speak of for about 3 days.  CT scan done without contrast which I have reviewed shows left sided colitis in the area up to the splenic flexure worrisome for ischemic colitis.  The patency of her vessels can not be determined on the uninfused scan, but atherosclerotic disease is present in many vessels.   PAST MEDICAL/SURGICAL HISTORY:  Past Medical History:   herniated disc in back:    CABG 5 vessel: Nov 2013   bilateral eye implants:    GERD - Esophageal Reflux:    Hyperlipidemia:    Hypertension:    Right Total Knee:    left partial knee replacement: Mar 2008  ALLERGIES:  Allergies:  NKDA: None  Neosporin: Rash  HOME MEDICATIONS:  Home Medications: Medication Instructions Status  Premarin 1.25 mg oral tablet 1 tab(s) orally once a day  Active  vitamin E 400 intl units oral capsule 1 cap(s) orally once a day  Active  Vitamin C 100 milligram(s)  once a day Active  amlodipine-benazepril 5 mg-10 mg oral capsule 1 cap(s) orally once a day (in the morning)  TAKE MORNING OF SURGERY Active  aspirin 325 mg oral tablet 1 tab(s) orally once a day Active  Anacin  orally , As Needed- for Pain  Active  calcium carbonate 500 mg (200 mg elemental calcium) oral tablet, chewable 1 tab(s) orally once a day Active  Vitamin D3 1000 intl units oral capsule 1 cap(s) orally once a day Active  optizinc 30 milligram(s)  once a day Active   Family and Social History:  Family History Non-Contributory   Social History negative tobacco, negative ETOH   Place of Living Home   Review of Systems:  Fever/Chills No   Cough No   Sputum No    Abdominal Pain Yes   Diarrhea Yes   Constipation No   Nausea/Vomiting Yes   SOB/DOE No   Chest Pain No   Dysuria No   Tolerating PT Yes   Tolerating Diet No  Nauseated   Medications/Allergies Reviewed Medications/Allergies reviewed   Physical Exam:  GEN well developed, well nourished, obese   HEENT pink conjunctivae, hearing intact to voice   NECK No masses  trachea midline   RESP normal resp effort  clear BS   CARD regular rate  No LE edema  no JVD   VASCULAR ACCESS none   ABD positive tenderness  soft  normal BS   GU no superpubic tenderness   LYMPH negative neck, negative axillae   EXTR negative cyanosis/clubbing, negative edema   SKIN normal to palpation, skin turgor good   NEURO cranial nerves intact, motor/sensory function intact   PSYCH alert, A+O to time, place, person   LABS:  Laboratory Results: Hepatic:    05-Jul-14 14:52, Comprehensive Metabolic Panel  Bilirubin, Total 0.5  Alkaline Phosphatase 73  SGPT (ALT) 21  SGOT (AST) 29  Total Protein, Serum 7.1  Albumin, Serum 3.0    06-Jul-14 03:40, Comprehensive Metabolic Panel  Bilirubin, Total 0.5  Alkaline Phosphatase 69  SGPT (ALT) 15  SGOT (AST) 22  Total Protein, Serum 5.8  Albumin, Serum 2.5  Routine Chem:  05-Jul-14 14:52, Comprehensive Metabolic Panel  Glucose, Serum 146  BUN 13  Creatinine (comp) 1.28  Sodium, Serum 138  Potassium, Serum 4.4  Chloride, Serum 104  CO2, Serum 23  Calcium (Total), Serum 8.6  Osmolality (calc) 278  eGFR (African American) 45  eGFR (Non-African American) 39  eGFR values <80m/min/1.73 m2 may be an indication of chronic  kidney disease (CKD).  Calculated eGFR is useful in patients with stable renal function.  The eGFR calculation will not be reliable in acutely ill patients  when serum creatinine is changing rapidly. It is not useful in   patients on dialysis. The eGFR calculation may not be applicable  to patients at the low and high  extremes of body sizes, pregnant  women, and vegetarians.  Anion Gap 11    05-Jul-14 14:52, Lipase  Lipase 77  Result(s) reported on 01 Jun 2013 at 03:59PM.    05-Jul-14 106:23 Basic Metabolic Panel (w/Total Calcium)  Glucose, Serum 157  BUN 11  Creatinine (comp) 1.22  Sodium, Serum 139  Potassium, Serum 4.2  Chloride, Serum 107  CO2, Serum 23  Calcium (Total), Serum 8.0  Anion Gap 9  Osmolality (calc) 280  eGFR (African American) 48  eGFR (Non-African American) 41  eGFR values <634mmin/1.73 m2 may be an indication of chronic  kidney disease (CKD).  Calculated eGFR is useful in patients with stable renal function.  The eGFR calculation will not be reliable in acutely ill patients  when serum creatinine is changing rapidly. It is not useful in   patients on dialysis. The eGFR calculation may not be applicable  to patients at the low and high extremes of body sizes, pregnant  women, and vegetarians.    06-Jul-14 03:40, Comprehensive Metabolic Panel  Glucose, Serum 127  BUN 9  Creatinine (comp) 1.09  Sodium, Serum 141  Potassium, Serum 4.0  Chloride, Serum 108  CO2, Serum 25  Calcium (Total), Serum 7.6  Osmolality (calc) 282  eGFR (African American) 55  eGFR (Non-African American) 47  eGFR values <6064min/1.73 m2 may be an indication of chronic  kidney disease (CKD).  Calculated eGFR is useful in patients with stable renal function.  The eGFR calculation will not be reliable in acutely ill patients  when serum creatinine is changing rapidly. It is not useful in   patients on dialysis. The eGFR calculation may not be applicable  to patients at the low and high extremes of body sizes, pregnant  women, and vegetarians.  Anion Gap 8    07-Jul-14 05:76:28asic Metabolic Panel (w/Total Calcium)  Glucose, Serum 115  BUN 7  Creatinine (comp) 0.99  Sodium, Serum 141  Potassium, Serum 4.0  Chloride, Serum 110  CO2, Serum 23  Calcium (Total), Serum 7.8  Anion Gap 8   Osmolality (calc) 280  eGFR (African American) >60  eGFR (Non-African American) 53  eGFR values <64m102mn/1.73 m2 may be an indication of chronic  kidney disease (CKD).  Calculated eGFR is useful in patients with stable renal function.  The eGFR calculation will not be reliable in acutely ill patients  when serum creatinine is changing rapidly. It is not useful in   patients on dialysis. The eGFR calculation may not be applicable  to patients at the low and high extremes of body sizes, pregnant  women, and vegetarians.  Routine UA:    05-Jul-14 14:52, Urinalysis  Color (UA) Yellow  Clarity (UA) Clear  Glucose (UA) 50 mg/dL  Bilirubin (UA) Negative  Ketones (UA) Trace  Specific Gravity (UA) 1.015  Blood (UA) Negative  pH (UA) 5.0  Protein (UA) Negative  Nitrite (UA) Negative  Leukocyte Esterase (UA) Negative  Result(s) reported on 01 Jun 2013 at 03:19PM.  RBC (UA) <1 /HPF  WBC (UA) 1 /HPF  Bacteria (UA) TRACE  Epithelial Cells (UA) 2 /HPF  Mucous (UA) PRESENT  Result(s) reported on 01 Jun 2013 at 03:19PM.  Routine Coag:    05-Jul-14 19:16, Activated PTT  Activated PTT (APTT) 29.2  A HCT value >55% may artifactually increase the APTT. In one study,  the increase was an average of 19%.  Reference: "Effect on Routine and Special Coagulation Testing Values  of Citrate Anticoagulant Adjustment in Patients with High HCT Values."  American Journal of Clinical Pathology 2006;126:400-405.    05-Jul-14 19:16, Prothrombin Time  Prothrombin 14.7  INR 1.1  INR reference interval applies to patients on anticoagulant therapy.  A single INR therapeutic range for coumarins is not optimal for all  indications; however, the suggested range for most indications is  2.0 - 3.0.  Exceptions to the INR Reference Range may include: Prosthetic heart  valves, acute myocardial infarction, prevention of myocardial  infarction, and combinations of aspirin and anticoagulant. The need  for a higher  or lower target INR must be assessed individually.  Reference: The Pharmacology and Management of the Vitamin K   antagonists: the seventh ACCP Conference on Antithrombotic and  Thrombolytic Therapy. INOMV.6720 Sept:126 (3suppl): [id number].  A HCT value >55% may artifactually increase the PT.  In one study,   the increase was an average of 25%.  Reference:  "Effect on Routine and Special Coagulation Testing Values  of Citrate Anticoagulant Adjustment in Patients with High HCT Values."  American Journal of Clinical Pathology 2006;126:400-405.    06-Jul-14 03:40, Activated PTT  Activated PTT (APTT) 123.3  A HCT value >55% may artifactually increase the APTT. In one study,  the increase was an average of 19%.  Reference: "Effect on Routine and Special Coagulation Testing Values  of Citrate Anticoagulant Adjustment in Patients with High HCT Values."  American Journal of Clinical Pathology 2006;126:400-405.    06-Jul-14 13:51, Activated PTT  Activated PTT (APTT) 85.8  A HCT value >55% may artifactually increase the APTT. In one study,  the increase was an average of 19%.  Reference: "Effect on Routine and Special Coagulation Testing Values  of Citrate Anticoagulant Adjustment in Patients with High HCT Values."  American Journal of Clinical Pathology 2006;126:400-405.    07-Jul-14 05:40, Activated PTT  Activated PTT (APTT) 63.8  A HCT value >55% may artifactually increase the APTT. In one study,  the increase was an average of 19%.  Reference: "Effect on Routine and Special Coagulation Testing Values  of Citrate Anticoagulant Adjustment in Patients with High HCT Values."  American Journal of Clinical Pathology 2006;126:400-405.  Routine Hem:    05-Jul-14 14:52, Hemogram, Platelet Count  WBC (CBC) 14.0  RBC (CBC) 4.17  Hemoglobin (CBC) 13.1  Hematocrit (CBC) 38.4  Platelet Count (CBC) 246  Result(s) reported on 01 Jun 2013 at 03:52PM.  MCV 92  MCH 31.4  MCHC 34.0  RDW 13.3     06-Jul-14 03:40, CBC Profile  WBC (CBC) 12.5  RBC (CBC) 3.47  Hemoglobin (CBC) 11.0  Hematocrit (CBC) 32.0  Platelet Count (CBC) 212  MCV 92  MCH 31.7  MCHC 34.4  RDW 13.2  Neutrophil % 64.4  Lymphocyte % 21.8  Monocyte % 12.7  Eosinophil % 0.6  Basophil % 0.5  Neutrophil # 8.1  Lymphocyte # 2.7  Monocyte # 1.6  Eosinophil # 0.1  Basophil # 0.1  Result(s) reported on 02 Jun 2013 at 04:00AM.    07-Jul-14 05:40, CBC Profile  WBC (CBC) 11.2  RBC (CBC) 3.62  Hemoglobin (CBC) 11.5  Hematocrit (CBC) 34.4  Platelet Count (CBC) 181  MCV 95  MCH 31.8  MCHC 33.5  RDW 13.4  Neutrophil % 63.1  Lymphocyte % 24.7  Monocyte % 9.4  Eosinophil % 2.4  Basophil % 0.4  Neutrophil # 7.0  Lymphocyte # 2.8  Monocyte # 1.1  Eosinophil # 0.3  Basophil # 0.1  Result(s) reported on 03 Jun 2013 at 06:04AM.    07-Jul-14 13:00, CBC Profile  WBC (CBC) 10.4  RBC (CBC) 3.36  Hemoglobin (CBC) 10.2  Hematocrit (CBC) 31.3  Platelet Count (CBC) 196  MCV 93  MCH 30.5  MCHC 32.7  RDW 13.6  Neutrophil % 55.9  Lymphocyte % 28.3  Monocyte % 12.2  Eosinophil % 2.9  Basophil % 0.7  Neutrophil # 5.8  Lymphocyte # 3.0  Monocyte # 1.3  Eosinophil # 0.3  Basophil # 0.1  Result(s) reported on 03 Jun 2013 at 01:12PM.   RADIOLOGY:  Radiology Results: XRay:    05-Jul-14 15:06, Abdomen 3 Way Includes PA Chest  Abdomen 3 Way Includes PA Chest  REASON FOR EXAM:    abdominal pain, hypoxia  COMMENTS:       PROCEDURE: DXR - DXR ABDOMEN 3-WAY (INCL PA CXR)  - Jun 01 2013  3:06PM     RESULT: Comparison: None.    Findings:  The heart is normal in size. Prior median sternotomy and CABG. No focal   pulmonary opacities. No free intraperitoneal air. There is a loop of   air-filled small bowel in the left hemiabdomen which is at the upper   limits of normal. Air seen in the nondilated ascending colon. Multiple   punctate calcification overlying the right medial abdomen, which are    nonspecific.  IMPRESSION:   There is a single loop air-filled small bowel at the upper limits of   normal in diameter in the left hemiabdomen. This is nonspecific, but   could represent a focal ileus. Followup radiographs could be performed,   as indicated.      Dictation Site: 8        Verified By: Gregor Hams, M.D., MD  LabUnknown:    24-Mar-14 15:32, MRI Cervical Spine Without Contrast  PACS Image    05-Jul-14 15:06, Abdomen 3 Way Includes PA Chest  PACS Image    05-Jul-14 16:48, CT Abdomen and Pelvis Without Contrast  PACS Image  MRI:    24-Mar-14 15:32, MRI Cervical Spine Without Contrast  MRI Cervical Spine Without Contrast  REASON FOR EXAM:    neck pain  LT arm pain  COMMENTS:       PROCEDURE: MMR - MMR CERVICAL SPINE WO CONT  - Feb 18 2013  3:32PM     RESULT: Comparison: None    Technique: Standard cervical spine protocol, without administration of IV   contrast.     Findings:   There is mild reversal of the normal cervical lordosis. There is   approximately 2-3 mm of anterolisthesis of C5 on C6. Bone marrow signal   is within normal limits. The spinal cord is normal in caliber and signal.   There is no cerebellar ectopia.  C2-C3: No significant posterior disc bulge. No neuroforaminal narrowing.  C3-C4: No significant posterior disc bulge. Uncovertebral hypertrophy   causes minimal right neuroforaminal narrowing.    C4-C5: No significant posterior disc bulge or neuroforaminal narrowing.    C5-C6: Mild posterior disc bulge and anterolisthesis cause effacement of   the ventral CSF space. Uncovertebral hypertrophy causes mild right   neuroforaminal narrowing.    C6-C7: Mild posterior disc bulge causes effacement of ventral CSF space.   Uncovertebral hypertrophy causes mild right neuroforaminal narrowing.    C7-T1: No significant disc bulge or neuroforaminal narrowing.  IMPRESSION:   Mild degenerative disc disease in the lower cervical  spine.        Verified By: Gregor Hams, M.D., MD  CT:    05-Jul-14 16:48, CT Abdomen and Pelvis Without Contrast  CT Abdomen and Pelvis Without Contrast  REASON FOR EXAM:    (1) LLQ abd pain; (2) LLQ abd pain  COMMENTS:       PROCEDURE: CT  - CT ABDOMEN AND PELVIS W0  - Jun 01 2013  4:48PM     RESULT: Comparison: None    Technique: Multiple axial images from the lung bases to the symphysis   pubis were obtained without oral and without intravenous contrast.    Findings:  Mild basilar opacities are likely secondary to atelectasis.   Calcifications are seen in the coronary arteries.    Lack of intravenous contrast limits evaluation of the solid abdominal     organs.  Punctate calcifications in the liver are likely sequela of old   prior infection. The gallbladder, spleen, adrenals, and pancreas are   unremarkable. No renal calculi or hydronephrosis.     Calcified atherosclerotic plaques are demonstrated at the origin of the   SMA. The patient is status post hysterectomy. There are a few diverticula   in the sigmoid colon. There is bowel wall thickening and mild adjacent   inflammatory stranding involving the descending colon and splenic   flexure. The appendix is not visualized, consistent with reported history   of appendectomy.    No aggressive lytic or sclerotic osseous lesions are identified.    IMPRESSION:   Findings of colitis involving the splenic flexure and descending colon.     Differential would include infectious, inflammatory, and ischemic   etiologies.      Dictation Site: 8        Verified By: Gregor Hams, M.D., MD   ASSESSMENT AND PLAN:  Assessment/Admission Diagnosis left sided abdominal pain and likely left sided colitis. Ischemic colitis in differential diagnosis.   Plan Discussed with family natural history and pathophysiology of ischemic colitis.  The majority of patients with ischemic colitis have mostly small vessel disease that  would not benefit from any vascular procedures.  A minority of patients do have SMA or IMA disease proximally and they may benefit from treatment to avoid recurrent issues.  Will obtain a CT angiogram to gain further evaluation.  If significant large vessel visceral disease is suspected on the CT scan, would then consider catheter based angiogram for further evaluation and potential treatment.  Will follow and review CT findings with patient'    level 4   Electronic Signatures: Algernon Huxley (MD)  (Signed 07-Jul-14 13:33)  Authored: Chief Complaint and History, PAST MEDICAL/SURGICAL HISTORY, ALLERGIES, HOME MEDICATIONS, Family and Social History, Review of Systems, Physical Exam, LABS, RADIOLOGY, Assessment and Plan   Last Updated: 07-Jul-14 13:33 by Algernon Huxley (MD)

## 2015-03-20 NOTE — H&P (Signed)
PATIENT NAME:  Kimberly Scott, Kimberly Scott MR#:  [id number] DATE OF BIRTH:  [birth year]  DATE OF ADMISSION:  06/01/2013  PRIMARY CARE PHYSICIAN: John B. Sarina Ser, MD  CHIEF COMPLAINT: Left lower quadrant pain and nausea.   HISTORY OF PRESENTING ILLNESS: An 81 year old female patient with history of coronary artery disease, CABG, hypertension, presents to the hospital complaining of 12 hours of left lower quadrant pain and nausea. The patient also had an episode of vomiting. She took a dose of Dulcolax for constipation, after which her pain started. She had a small bowel movement after that without any blood. No blood noticed in the vomiting. No shortness of breath, chest pain. No lightheadedness. CT scan of the abdomen has shown splenic flexure colitis along with significant atherosclerosis. Concern for ischemic colitis. Dr. Burt Knack of surgery has seen the patient, suggested anticoagulating the patient with a heparin drip along with IV fluids, and the patient will need a vascular surgery elective consult as per him. The patient did have low GFR and contrast could not be given with CAT scan. The patient feels a little better with the pain at this time after pain medication, is getting IV fluids in the ER.   PAST MEDICAL HISTORY:  1.  Coronary artery disease, status post CABG.  2.  Hypertension.  3.  Arthritis.   PAST SURGICAL HISTORY: Appendectomy, bilateral knee replacements, cataract surgeries and skin surgeries for skin cancer on the face.   SOCIAL HISTORY: The patient lives at home with her husband. Does not smoke. No alcohol. No illicit drugs.   ALLERGIES: NEOSPORIN.   FAMILY HISTORY: Brain cancer and coronary artery disease in her mom.   REVIEW OF SYSTEMS: CONSTITUTIONAL: Complains of fatigue. No fever.  EYES: No blurred vision, pain, redness. EARS, NOSE, THROAT: No tinnitus, ear pain, hearing loss.  RESPIRATORY: No cough, wheeze.  CARDIOVASCULAR: No chest pain, orthopnea. Has lower extremity  edema which is chronic.  GASTROINTESTINAL: Has nausea, vomiting, abdominal pain. No hematemesis, melena.  GENITOURINARY: No dysuria, hematuria, frequency.  ENDOCRINE: No polyuria, nocturia, thyroid problems. HEMATOLOGIC AND LYMPHATIC: No anemia, easy bruising, bleeding.  INTEGUMENTARY: No acne, rash, lesions.  MUSCULOSKELETAL: Has arthritis.  NEUROLOGIC: No focal numbness, weakness, dysarthria or seizures.  PSYCHIATRIC: No anxiety or depression.   HOME MEDICATIONS: Include: 1.  Amlodipine/benazepril 5/10 mg oral once a day.  2.  Aspirin 325 mg oral once a day.  3.  Calcium, vitamin D 1 tablet oral once a day.  4.  Premarin 1.25 mg oral once a day.  5.  Vitamin C 100 mg oral once a day.  6.  Vitamin D3, 1000 international units oral once a day.  7.  Vitamin E 400 international units oral once a day.   PHYSICAL EXAMINATION: VITAL SIGNS: Temperature 98.2, pulse of 79, blood pressure 163/63, saturating 94% on room air.  GENERAL: Obese Caucasian female patient lying in bed, comfortable, conversational, cooperative with exam.  PSYCHIATRIC: Alert, oriented x 3. Mood and affect appropriate. Judgment intact.  HEENT: Atraumatic, normocephalic. Oral mucosa moist and pink. External ears and nose normal. No pallor. No icterus. Pupils bilaterally equal and reactive to light.  NECK: Supple. No thyromegaly. No palpable lymph nodes. Trachea midline. No carotid bruits, JVD.  CARDIOVASCULAR: S1, S2, without any murmurs. Peripheral pulses 2+. Has 1+ edema in the lower extremities.  RESPIRATORY: Normal work of breathing. Clear to auscultation on both sides.  GASTROINTESTINAL: Soft abdomen. Tenderness in the left lower quadrant without rigidity or guarding. Bowel sounds present.  GENITOURINARY:  No CVA tenderness or bladder distention.  SKIN: Warm and dry. No petechiae, rash, ulcers.  MUSCULOSKELETAL: No joint swelling, redness, effusion of the large joints. Scars from prior surgeries on the knees.   NEUROLOGICAL: Motor strength 5/5 in upper and lower extremities. Sensation to fine touch intact all over.  LYMPHATIC: No cervical or supraclavicular lymphadenopathy.   LABORATORY AND RADIOLOGICAL DATA: Glucose of 157, BUN 13, creatinine 1.28 with GFR of 39, sodium 138, potassium 4.4. AST, ALT, alkaline phosphatase normal. WBC 14, hemoglobin 13.1, platelets of 246. INR 1.1. Urinalysis shows trace bacteria, 1 WBC. Lactic acid 2.2.   CT scan of the abdomen and pelvis without contrast shows findings of colitis in the splenic flexure and descending colon, possibly infectious, inflammatory, ischemic colitis. Calcified atherosclerotic plaques are demonstrated at the origin of SMA. The patient is status post hysterectomy.   ASSESSMENT AND PLAN: 1.  Acute colitis of the descending colon and splenic flexure: High suspicion for ischemic colitis. Discussed the case with Dr. Burt Knack of surgery. Advised to start on heparin. Will also cover with antibiotics with Cipro, Flagyl secondary to elevated white count. Fluid resuscitate the patient and repeat lactic acid in the morning. With low GFR, cannot give contrast. Will consult vascular surgery for an angiogram and possible intervention. Discussed with the patient and family at bedside. Also discussed with Dr. Burt Knack of surgery. Initially the patient was thought to be a candidate for transfer, but Dr. Burt Knack suggested patient be kept here and elective vascular consult.  2.  Acute renal failure with GFR of 39: Looking at prior labs, creatinine was at 0.8, presently at 1.28. Will fluid resuscitate and check in the morning again.  3.  Coronary artery disease: Seems stable.  4.  Hypertension: Elevated likely from pain. Cannot give oral medications. Will start her on as-needed intravenous medications.  5.  Deep vein thrombosis prophylaxis: The patient is on a heparin drip.   CODE STATUS: Full code.   TIME SPENT: Today on this case was 45  minutes.   ____________________________ Kimberly Alf Meera Vasco, MD srs:jm D: 06/01/2013 21:20:33 ET T: 06/01/2013 21:34:22 ET JOB#: [id number]  cc: Alveta Heimlich R. Dashonda Bonneau, MD, <Dictator> Richard E. Burt Knack, [place] Sarina Ser, MD Alveta Heimlich Arlice Colt MD ELECTRONICALLY SIGNED 06/02/2013 14:12

## 2015-03-20 NOTE — Consult Note (Signed)
PATIENT NAME:  Kimberly Scott, Kimberly Scott MR#:  [id number] DATE OF BIRTH:  October 02, 1928  DATE OF CONSULTATION:  06/02/2013  REFERRING PHYSICIAN:   CONSULTING PHYSICIAN:  Manya Silvas, MD   HISTORY OF PRESENT ILLNESS: The patient is an 81 year old white female with a history of vascular disease, coronary artery disease and previous coronary artery bypass surgery last year. She had an episode of left mid and lower abdominal pain with nausea and vomiting. She took some Dulcolax Friday afternoon and had a bowel movement; somewhat hard balls of stool and then early Sunday morning she developed onset of significant abdominal pain. She came to the ER and a CAT scan of the abdomen showed colitis of the splenic flexure and descending colon with plaque on the origins of the superior mesenteric artery and she was admitted to the hospital with a diagnosis of ischemic colitis. I was asked to see her in consultation.   PAST MEDICAL HISTORY: 1.  Hypertension.  2.  Arthritis.  3.  Coronary artery disease.  4.  Previous coronary artery bypass graft.   PAST SURGICAL HISTORY: Appendectomy, bilateral knee replacements, cataract surgeries and skin cancer surgeries.   SOCIAL HISTORY: She does not smoke, does not drink alcohol. No smokeless tobacco.   ALLERGIES: NEOSPORIN.   Family history:  Coronary artery disease in her mother and brain cancer.   REVIEW OF SYSTEMS: No hematemesis. No rectal bleeding. Does have occasional constipation. No recent chest pains. Prior to this episode, no nausea or vomiting. No dysuria or hematuria. No skin rashes.   MEDICATIONS: Amlodipine/benazepril 5/10 mg once a day, aspirin 325 mg once a day, calcium vitamin D 1 tablet a day,  Premarin 1.25 mg a day, vitamin C, vitamin D3 and vitamin E.   PHYSICAL EXAMINATION: GENERAL: Elderly white female looks younger than stated age.  HEENT: Sclerae are anicteric. Conjunctivae negative. Tongue negative.  NECK: No carotid bruits.  CHEST: Clear  anterior fields.  HEART: No murmurs or gallops I can hear.  ABDOMEN: Bowel sounds present, but diminished. There is tenderness, did very gentle palpation in the left abdomen.  SKIN: Warm and dry.  PSYCH: Mood and affect are appropriate and alert.   LABORATORY, DIAGNOSTIC AND RADIOLOGIC DATA:  CAT scan shows inflammation of the splenic flexure and descending colon and arthrosclerotic plaques in origin of the MSA.   Glucose 157, BUN is 13, creatinine 1.28, sodium 138, potassium 4.4. Liver functions normal. White count 14,000, hemoglobin 13, platelet count 246, INR 1.1 Lactic acid 2.2.   ASSESSMENT: Ischemic colitis, probably large vessel disease with plaque.   RECOMMENDATIONS: Agree with heparinization. If the patient begins to bleed from ischemic colitis, this may need to be stopped. I do not do think colonoscopic evaluation would be helpful at this time is, as there is a strong likelihood that she has is ischemic colitis. Agree with vascular surgery consult tomorrow. We will follow with you   ____________________________ Manya Silvas, MD rte:cc D: 06/02/2013 14:31:21 ET T: 06/02/2013 15:25:03 ET JOB#: [id number]  cc: Manya Silvas, MD, <Dictator> Adin Hector, MD Hewitt Blade. Sarina Ser, MD Manya Silvas MD ELECTRONICALLY SIGNED 06/22/2013 8:06

## 2016-05-04 ENCOUNTER — Other Ambulatory Visit: Payer: Self-pay | Admitting: Physician Assistant

## 2016-05-04 DIAGNOSIS — T84039D Mechanical loosening of unspecified internal prosthetic joint, subsequent encounter: Secondary | ICD-10-CM

## 2016-05-17 ENCOUNTER — Encounter
Admission: RE | Admit: 2016-05-17 | Discharge: 2016-05-17 | Disposition: A | Discharge status: Home / Self Care | Payer: Medicare Other | Source: Ambulatory Visit | Attending: Physician Assistant | Admitting: Physician Assistant

## 2016-05-17 DIAGNOSIS — T84039D Mechanical loosening of unspecified internal prosthetic joint, subsequent encounter: Secondary | ICD-10-CM | POA: Insufficient documentation

## 2016-05-17 DIAGNOSIS — X58XXXD Exposure to other specified factors, subsequent encounter: Secondary | ICD-10-CM | POA: Diagnosis not present

## 2016-05-17 MED ORDER — TECHNETIUM TC 99M MEDRONATE IV KIT
22.6710 | PACK | Freq: Once | INTRAVENOUS | Status: AC | PRN
  Administered 2016-05-17: 22.671 via INTRAVENOUS

## 2016-06-06 ENCOUNTER — Other Ambulatory Visit: Payer: Self-pay | Admitting: Orthopedic Surgery

## 2016-06-06 DIAGNOSIS — M5116 Intervertebral disc disorders with radiculopathy, lumbar region: Secondary | ICD-10-CM

## 2016-06-09 ENCOUNTER — Ambulatory Visit
Admission: RE | Admit: 2016-06-09 | Discharge: 2016-06-09 | Disposition: A | Discharge status: Home / Self Care | Payer: Medicare Other | Source: Ambulatory Visit | Attending: Orthopedic Surgery | Admitting: Orthopedic Surgery

## 2016-06-09 DIAGNOSIS — M5126 Other intervertebral disc displacement, lumbar region: Secondary | ICD-10-CM | POA: Insufficient documentation

## 2016-06-09 DIAGNOSIS — M5127 Other intervertebral disc displacement, lumbosacral region: Secondary | ICD-10-CM | POA: Diagnosis not present

## 2016-06-09 DIAGNOSIS — M5116 Intervertebral disc disorders with radiculopathy, lumbar region: Secondary | ICD-10-CM | POA: Insufficient documentation

## 2016-08-30 ENCOUNTER — Other Ambulatory Visit: Payer: Self-pay | Admitting: Orthopedic Surgery

## 2016-08-30 DIAGNOSIS — T84039D Mechanical loosening of unspecified internal prosthetic joint, subsequent encounter: Secondary | ICD-10-CM

## 2016-09-05 ENCOUNTER — Encounter: Admission: RE | Admit: 2016-09-05 | Payer: Medicare Other | Source: Ambulatory Visit

## 2016-09-15 ENCOUNTER — Encounter
Admission: RE | Admit: 2016-09-15 | Discharge: 2016-09-15 | Disposition: A | Discharge status: Home / Self Care | Payer: Medicare Other | Source: Ambulatory Visit | Attending: Orthopedic Surgery | Admitting: Orthopedic Surgery

## 2016-09-15 DIAGNOSIS — T84039D Mechanical loosening of unspecified internal prosthetic joint, subsequent encounter: Secondary | ICD-10-CM | POA: Diagnosis present

## 2016-09-15 DIAGNOSIS — M1712 Unilateral primary osteoarthritis, left knee: Secondary | ICD-10-CM | POA: Insufficient documentation

## 2016-09-15 DIAGNOSIS — Z96653 Presence of artificial knee joint, bilateral: Secondary | ICD-10-CM | POA: Diagnosis not present

## 2016-09-15 MED ORDER — TECHNETIUM TC 99M MEBROFENIN IV KIT: 22.3200 | PACK | Freq: Once | INTRAVENOUS | Status: DC | PRN

## 2016-09-15 MED ORDER — TECHNETIUM TC 99M MEDRONATE IV KIT
25.0000 | PACK | Freq: Once | INTRAVENOUS | Status: AC | PRN
  Administered 2016-09-15: 22.32 via INTRAVENOUS

## 2017-04-27 ENCOUNTER — Other Ambulatory Visit: Payer: Self-pay | Admitting: Student

## 2017-04-27 DIAGNOSIS — R1310 Dysphagia, unspecified: Secondary | ICD-10-CM

## 2017-04-28 ENCOUNTER — Ambulatory Visit
Admission: RE | Admit: 2017-04-28 | Discharge: 2017-04-28 | Disposition: A | Discharge status: Home / Self Care | Payer: Medicare Other | Source: Ambulatory Visit | Attending: Student | Admitting: Student

## 2017-04-28 DIAGNOSIS — K224 Dyskinesia of esophagus: Secondary | ICD-10-CM | POA: Insufficient documentation

## 2017-04-28 DIAGNOSIS — R1310 Dysphagia, unspecified: Secondary | ICD-10-CM | POA: Diagnosis present

## 2018-05-20 ENCOUNTER — Inpatient Hospital Stay
Admission: EM | Admit: 2018-05-20 | Discharge: 2018-05-23 | DRG: 069 | Disposition: A | Discharge status: Home Health | Payer: Medicare Other | Attending: Internal Medicine | Admitting: Internal Medicine

## 2018-05-20 ENCOUNTER — Other Ambulatory Visit: Payer: Self-pay

## 2018-05-20 ENCOUNTER — Emergency Department: Payer: Medicare Other

## 2018-05-20 DIAGNOSIS — R9082 White matter disease, unspecified: Secondary | ICD-10-CM | POA: Diagnosis present

## 2018-05-20 DIAGNOSIS — E871 Hypo-osmolality and hyponatremia: Secondary | ICD-10-CM | POA: Diagnosis present

## 2018-05-20 DIAGNOSIS — G20 Parkinson's disease: Secondary | ICD-10-CM | POA: Diagnosis present

## 2018-05-20 DIAGNOSIS — N179 Acute kidney failure, unspecified: Secondary | ICD-10-CM | POA: Diagnosis present

## 2018-05-20 DIAGNOSIS — M1990 Unspecified osteoarthritis, unspecified site: Secondary | ICD-10-CM | POA: Diagnosis not present

## 2018-05-20 DIAGNOSIS — Z888 Allergy status to other drugs, medicaments and biological substances status: Secondary | ICD-10-CM

## 2018-05-20 DIAGNOSIS — Z9849 Cataract extraction status, unspecified eye: Secondary | ICD-10-CM | POA: Diagnosis not present

## 2018-05-20 DIAGNOSIS — Z96651 Presence of right artificial knee joint: Secondary | ICD-10-CM | POA: Diagnosis present

## 2018-05-20 DIAGNOSIS — Z90710 Acquired absence of both cervix and uterus: Secondary | ICD-10-CM | POA: Diagnosis not present

## 2018-05-20 DIAGNOSIS — R4701 Aphasia: Secondary | ICD-10-CM | POA: Diagnosis not present

## 2018-05-20 DIAGNOSIS — G9340 Encephalopathy, unspecified: Secondary | ICD-10-CM

## 2018-05-20 DIAGNOSIS — G459 Transient cerebral ischemic attack, unspecified: Secondary | ICD-10-CM | POA: Diagnosis present

## 2018-05-20 DIAGNOSIS — R29810 Facial weakness: Secondary | ICD-10-CM | POA: Diagnosis not present

## 2018-05-20 DIAGNOSIS — E119 Type 2 diabetes mellitus without complications: Secondary | ICD-10-CM | POA: Diagnosis present

## 2018-05-20 DIAGNOSIS — I10 Essential (primary) hypertension: Secondary | ICD-10-CM | POA: Diagnosis not present

## 2018-05-20 DIAGNOSIS — I2510 Atherosclerotic heart disease of native coronary artery without angina pectoris: Secondary | ICD-10-CM | POA: Diagnosis present

## 2018-05-20 DIAGNOSIS — R011 Cardiac murmur, unspecified: Secondary | ICD-10-CM | POA: Diagnosis present

## 2018-05-20 DIAGNOSIS — G9341 Metabolic encephalopathy: Secondary | ICD-10-CM | POA: Diagnosis present

## 2018-05-20 DIAGNOSIS — K219 Gastro-esophageal reflux disease without esophagitis: Secondary | ICD-10-CM | POA: Diagnosis present

## 2018-05-20 DIAGNOSIS — T502X5A Adverse effect of carbonic-anhydrase inhibitors, benzothiadiazides and other diuretics, initial encounter: Secondary | ICD-10-CM | POA: Diagnosis not present

## 2018-05-20 DIAGNOSIS — Z791 Long term (current) use of non-steroidal anti-inflammatories (NSAID): Secondary | ICD-10-CM | POA: Diagnosis not present

## 2018-05-20 DIAGNOSIS — Z8673 Personal history of transient ischemic attack (TIA), and cerebral infarction without residual deficits: Secondary | ICD-10-CM

## 2018-05-20 DIAGNOSIS — G9349 Other encephalopathy: Secondary | ICD-10-CM | POA: Diagnosis present

## 2018-05-20 DIAGNOSIS — Z808 Family history of malignant neoplasm of other organs or systems: Secondary | ICD-10-CM

## 2018-05-20 DIAGNOSIS — Z951 Presence of aortocoronary bypass graft: Secondary | ICD-10-CM

## 2018-05-20 DIAGNOSIS — W06XXXA Fall from bed, initial encounter: Secondary | ICD-10-CM | POA: Diagnosis present

## 2018-05-20 DIAGNOSIS — Z91048 Other nonmedicinal substance allergy status: Secondary | ICD-10-CM | POA: Diagnosis not present

## 2018-05-20 DIAGNOSIS — R29700 NIHSS score 0: Secondary | ICD-10-CM | POA: Diagnosis present

## 2018-05-20 DIAGNOSIS — E785 Hyperlipidemia, unspecified: Secondary | ICD-10-CM | POA: Diagnosis not present

## 2018-05-20 DIAGNOSIS — Y92230 Patient room in hospital as the place of occurrence of the external cause: Secondary | ICD-10-CM | POA: Diagnosis not present

## 2018-05-20 DIAGNOSIS — F0150 Vascular dementia without behavioral disturbance: Secondary | ICD-10-CM | POA: Diagnosis present

## 2018-05-20 DIAGNOSIS — Z8249 Family history of ischemic heart disease and other diseases of the circulatory system: Secondary | ICD-10-CM

## 2018-05-20 DIAGNOSIS — Z823 Family history of stroke: Secondary | ICD-10-CM

## 2018-05-20 DIAGNOSIS — Z7982 Long term (current) use of aspirin: Secondary | ICD-10-CM | POA: Diagnosis not present

## 2018-05-20 DIAGNOSIS — Z79899 Other long term (current) drug therapy: Secondary | ICD-10-CM

## 2018-05-20 DIAGNOSIS — R633 Feeding difficulties: Secondary | ICD-10-CM | POA: Diagnosis present

## 2018-05-20 DIAGNOSIS — Z85828 Personal history of other malignant neoplasm of skin: Secondary | ICD-10-CM

## 2018-05-20 LAB — COMPREHENSIVE METABOLIC PANEL
ALT: <5 U/L — ABNORMAL LOW (ref 14–54)
ANION GAP: 11 (ref 5–15)
AST: 41 U/L (ref 15–41)
Albumin: 3.9 g/dL (ref 3.5–5.0)
Alkaline Phosphatase: 76 U/L (ref 38–126)
BILIRUBIN TOTAL: 0.9 mg/dL (ref 0.3–1.2)
BUN: 13 mg/dL (ref 6–20)
CO2: 23 mmol/L (ref 22–32)
Calcium: 9 mg/dL (ref 8.9–10.3)
Chloride: 95 mmol/L — ABNORMAL LOW (ref 101–111)
Creatinine, Ser: 1.03 mg/dL — ABNORMAL HIGH (ref 0.44–1.00)
GFR, EST AFRICAN AMERICAN: 55 mL/min — AB (ref >60)
GFR, EST NON AFRICAN AMERICAN: 47 mL/min — AB (ref >60)
Glucose, Bld: 159 mg/dL — ABNORMAL HIGH (ref 65–99)
Potassium: 3.9 mmol/L (ref 3.5–5.1)
SODIUM: 129 mmol/L — AB (ref 135–145)
TOTAL PROTEIN: 7 g/dL (ref 6.5–8.1)

## 2018-05-20 LAB — URINALYSIS, COMPLETE (UACMP) WITH MICROSCOPIC
Bacteria, UA: NONE SEEN
Bilirubin Urine: NEGATIVE
GLUCOSE, UA: NEGATIVE mg/dL
Hgb urine dipstick: NEGATIVE
Ketones, ur: NEGATIVE mg/dL
LEUKOCYTES UA: NEGATIVE
NITRITE: NEGATIVE
PH: 6 (ref 5.0–8.0)
Protein, ur: NEGATIVE mg/dL
Specific Gravity, Urine: 1.006 (ref 1.005–1.030)
Squamous Epithelial / LPF: NONE SEEN (ref 0–5)

## 2018-05-20 LAB — CBC
HCT: 39.6 % (ref 35.0–47.0)
HEMOGLOBIN: 14 g/dL (ref 12.0–16.0)
MCH: 32.7 pg (ref 26.0–34.0)
MCHC: 35.5 g/dL (ref 32.0–36.0)
MCV: 92.2 fL (ref 80.0–100.0)
Platelets: 293 10*3/uL (ref 150–440)
RBC: 4.29 MIL/uL (ref 3.80–5.20)
RDW: 13.2 % (ref 11.5–14.5)
WBC: 8.3 10*3/uL (ref 3.6–11.0)

## 2018-05-20 LAB — CK: Total CK: 192 U/L (ref 38–234)

## 2018-05-20 LAB — TROPONIN I

## 2018-05-20 MED ORDER — SODIUM CHLORIDE 0.9 % IV BOLUS
1000.0000 mL | Freq: Once | INTRAVENOUS | Status: AC
  Administered 2018-05-20: 1000 mL via INTRAVENOUS

## 2018-05-20 NOTE — ED Notes (Signed)
Pt and family updated on admission process.

## 2018-05-20 NOTE — ED Notes (Signed)
Patient transported to CT 

## 2018-05-20 NOTE — ED Notes (Signed)
Pt assisted with bed pan

## 2018-05-20 NOTE — ED Triage Notes (Signed)
Pt from home with altered mental status. md at bedside. Pt alert to name only, cannot name birthday, place, date, situation.

## 2018-05-20 NOTE — ED Provider Notes (Signed)
[facility] Emergency Department Provider Note  ____________________________________________  Time seen: Approximately 9:13 PM  I have reviewed the triage vital signs and the nursing notes.   HISTORY  Chief Complaint Altered Mental Status   HPI Kimberly Scott is a 84 y.o. female with history of Parkinson's disease, hypertension, CAD, hyperlipidemia, and TIA who presents for evaluation of altered mental status.  History is gathered from patient, her daughter and granddaughter.  Patient's daughter called her this morning and when patient answered her cell phone and she told her daughter that she had been on the floor since 2 in the morning.  Apparently patient was trying to get in bed when she slipped and fell.  She fell on her buttock on the ground in her bedroom.  Her husband pulled her on a rug to the living room so she could grab a chair or the couch and assist herself up.  Patient was unable to get up.  She remained on the floor throughout the entire night.  Patient's daughter arrived around 7:30 AM.  911 was called and they were able to get patient off the floor and onto the couch.  At that time patient seems slightly confused as wide she had not call for help even though she had her cell phone right next to her.  However when 911 arrived patient seemed to be back to her baseline.  Her vitals were within normal limits.  Patient went out for breakfast with her daughter and told her daughter she was feeling very tired.  She came back to the house and went to sleep.  Daughter stayed in the house with patient's husband so patient could sleep.  Around 6PM patient got up, was at her baseline, walking with her cane.  Her granddaughter came to have dinner with her.  Around 8PM they receiving on the table having dinner when patient started behaving oddly. She started to throw putting on the ground and was not answering questions correctly.  Patient reports that she was having  a hard time coming up with words.  Granddaughter then noted a right-sided facial droop and right upper extremity weakness which were new for the patient.  She has no prior history of stroke and no deficits.  At this time patient is back to baseline according to the family.  She is no longer having difficulty finding words.  She has several gaps in memory of the things that happened today.  Patient denies any pain.  She denies head trauma or LOC.  She is not on blood thinners.   Past Medical History:  Diagnosis Date  . Anginal pain ([place])   . Arthritis   . Cancer (HCC)    FACIAL SKIN CANCER  . Coronary artery disease   . Headache(784.0)   . Heart murmur   . Hypertension   . Pneumonia    hx of PNA    Patient Active Problem List   Diagnosis Date Noted  . Coronary arteriosclerosis 09/28/2012  . Hypertension 09/28/2012  . Hyperlipidemia 09/28/2012  . GERD (gastroesophageal reflux disease) 09/28/2012  . Breast cyst 09/28/2012    Past Surgical History:  Procedure Laterality Date  . ABDOMINAL HYSTERECTOMY    . APPENDECTOMY    . cataracts    . CORONARY ARTERY BYPASS GRAFT  10/01/2012   Procedure: CORONARY ARTERY BYPASS GRAFTING (CABG);  Surgeon: Grace Isaac, MD;  Location: [place];  Service: Open Heart Surgery;  Laterality: N/A;  times five using Left Internal  Mammary Artery   . ENDOVEIN HARVEST OF GREATER SAPHENOUS VEIN  10/01/2012   Procedure: ENDOVEIN HARVEST OF GREATER SAPHENOUS VEIN;  Surgeon: Grace Isaac, MD;  Location: [place];  Service: Open Heart Surgery;  Laterality: Bilateral;  . KNEE SURGERY    . TONSILLECTOMY    . TOTAL KNEE ARTHROPLASTY     right knee    Prior to Admission medications   Medication Sig Start Date End Date Taking? Authorizing Provider  aspirin EC 81 MG tablet Take 81 mg by mouth daily.   Yes [provider]  benazepril (LOTENSIN) 20 MG tablet Take 20 mg by mouth daily.   Yes [provider]  carbidopa-levodopa (SINEMET IR) 25-100  MG tablet Take 1 tablet by mouth 3 (three) times daily.   Yes [provider]  cholecalciferol (VITAMIN D) 1000 units tablet Take 1,000 Units by mouth daily.   Yes [provider]  Cyanocobalamin (B-12 IJ) Inject 1 Dose as directed every 30 (thirty) days.   Yes [provider]  furosemide (LASIX) 40 MG tablet Take 40 mg by mouth daily.   Yes [provider]  hydrochlorothiazide (HYDRODIURIL) 25 MG tablet Take 25 mg by mouth daily.   Yes [provider]  meloxicam (MOBIC) 7.5 MG tablet Take 7.5 mg by mouth daily.   Yes [provider]    Allergies Omega 3  [fish oil]; Neosporin [neomycin-bacitracin zn-polymyx]; and Tape  No family history on file.  Social History Social History   Tobacco Use  . Smoking status: Never Smoker  . Smokeless tobacco: Never Used  Substance Use Topics  . Alcohol use: No  . Drug use: No    Review of Systems  Constitutional: Negative for fever. + AMS Eyes: Negative for visual changes. ENT: Negative for sore throat. Neck: No neck pain  Cardiovascular: Negative for chest pain. Respiratory: Negative for shortness of breath. Gastrointestinal: Negative for abdominal pain, vomiting or diarrhea. Genitourinary: Negative for dysuria. Musculoskeletal: Negative for back pain. Skin: Negative for rash. Neurological: Negative for headaches. + R sided facial droop and RUE weakness. Psych: No SI or HI  ____________________________________________   PHYSICAL EXAM:  VITAL SIGNS: ED Triage Vitals [05/20/18 2059]  Enc Vitals Group     BP (!) 152/59     Pulse Rate 75     Resp 18     Temp 98.5 F (36.9 C)     Temp Source Oral     SpO2 92 %     Weight 173 lb (78.5 kg)     Height      Head Circumference      Peak Flow      Pain Score 0     Pain Loc      Pain Edu?      Excl. in [place]?     Constitutional: Alert and oriented x3. Well appearing and in no apparent distress. HEENT:      Head: Normocephalic  and atraumatic.         Eyes: Conjunctivae are normal. Sclera is non-icteric.       Mouth/Throat: Mucous membranes are moist.       Neck: Supple with no signs of meningismus. Cardiovascular: Regular rate and rhythm. No murmurs, gallops, or rubs. 2+ symmetrical distal pulses are present in all extremities. No JVD. Respiratory: Normal respiratory effort. Lungs are clear to auscultation bilaterally. No wheezes, crackles, or rhonchi.  Gastrointestinal: Soft, non tender, and non distended with positive bowel sounds. No rebound or guarding. Musculoskeletal:  No CT and L-spine tenderness.  Pelvis stable.  Nontender with normal range of motion in all extremities. No edema, cyanosis, or erythema of extremities. Neurologic: Normal speech and language. A & O x3, PERRL, EOMI, no nystagmus, CN II-XII intact, motor testing reveals good tone and bulk throughout. There is no evidence of pronator drift or dysmetria. Muscle strength is 5/5 throughout. Sensory examination is intact. Gait deferred Skin: Skin is warm, dry and intact. No rash noted. Psychiatric: Mood and affect are normal. Speech and behavior are normal.  ____________________________________________   LABS (all labs ordered are listed, but only abnormal results are displayed)  Labs Reviewed  COMPREHENSIVE METABOLIC PANEL - Abnormal; Notable for the following components:      Result Value   Sodium 129 (*)    Chloride 95 (*)    Glucose, Bld 159 (*)    Creatinine, Ser 1.03 (*)    ALT <5 (*)    GFR calc non Af Amer 47 (*)    GFR calc Af Amer 55 (*)    All other components within normal limits  URINALYSIS, COMPLETE (UACMP) WITH MICROSCOPIC - Abnormal; Notable for the following components:   Color, Urine YELLOW (*)    APPearance CLEAR (*)    All other components within normal limits  CBC  TROPONIN I  CK   ____________________________________________  EKG  ED ECG REPORT I, Rudene Re, the attending physician, personally viewed and  interpreted this ECG.  NSR, rate 74, incomplete LBBB, normal QTc, LAD, no STE or depression. LBBB is new when compared to prior from 2014  ____________________________________________  RADIOLOGY  I have personally reviewed the images performed during this visit and I agree with the Radiologist's read.   Interpretation by Radiologist:  Ct Head Wo Contrast  Result Date: 05/20/2018 CLINICAL DATA:  Altered level of consciousness. EXAM: CT HEAD WITHOUT CONTRAST TECHNIQUE: Contiguous axial images were obtained from the base of the skull through the vertex without intravenous contrast. COMPARISON:  None. FINDINGS: Brain: Moderate chronic ischemic white matter disease is noted. No mass effect or midline shift is noted. Ventricular size is within normal limits. There is no evidence of mass lesion, hemorrhage or acute infarction. Vascular: No hyperdense vessel or unexpected calcification. Skull: Normal. Negative for fracture or focal lesion. Sinuses/Orbits: No acute finding. Other: None. IMPRESSION: Moderate chronic ischemic white matter disease. No acute intracranial abnormality seen. Electronically Signed   By: Marijo Conception, M.D.   On: 05/20/2018 21:48      ____________________________________________   PROCEDURES  Procedure(s) performed: None Procedures Critical Care performed:  None ____________________________________________   INITIAL IMPRESSION / ASSESSMENT AND PLAN / ED COURSE  84 y.o. female with history of Parkinson's disease, hypertension, CAD, hyperlipidemia, and TIA who presents for evaluation of altered mental status, R sided facial droop and RUE weakness. Last seen normal at 8PM. Patient is now back to baseline with no neuro deficits. No evidence of trauma per history and physical exam.  Will check CK to rule out rhabdo, UA to rule out UTI, labs to rule out dehydration or electrolyte abnormalities, CT to rule out acute stroke.  If work up negative and patient remains at baseline  will admit for TIA.    _________________________ 11:39 PM on 05/20/2018 -----------------------------------------  Labs showing hyponatremia which could be contributing to patient's confusion however still believe patient meets criteria for TIA and needs further evaluation.  Will give a fluid bolus challenge and admit for TIA work-up.   As part of my medical  decision making, I reviewed the following data within the [place] History obtained from family, Nursing notes reviewed and incorporated, Labs reviewed , EKG interpreted , Old EKG reviewed, Old chart reviewed, Discussed with admitting physician , Notes from prior ED visits and  Controlled Substance Database    Pertinent labs & imaging results that were available during my care of the patient were reviewed by me and considered in my medical decision making (see chart for details).    ____________________________________________   FINAL CLINICAL IMPRESSION(S) / ED DIAGNOSES  Final diagnoses:  Encephalopathy acute  TIA (transient ischemic attack)  Hyponatremia      NEW MEDICATIONS STARTED DURING THIS VISIT:  ED Discharge Orders    None       Note:  This document was prepared using Dragon voice recognition software and may include unintentional dictation errors.    Alfred Levins, [place], MD 05/20/18 [phone]

## 2018-05-20 NOTE — H&P (Signed)
[facility] NAME: Raquell Richer    MR#:  [id number]  DATE OF BIRTH:  [birth year]  DATE OF ADMISSION:  05/20/2018  PRIMARY CARE PHYSICIAN: Adin Hector, MD   REQUESTING/REFERRING PHYSICIAN:   CHIEF COMPLAINT:   Chief Complaint  Patient presents with  . Altered Mental Status    HISTORY OF PRESENT ILLNESS: Remell Giaimo  is a 84 y.o. female with a known history of Parkinson's disease, hypertension, coronary artery disease, hyperlipidemia and TIA. I was unable to obtain reliable history from the patient herself, due to her confusion.  Most of the information was taken from reviewing the medical chart and from discussion with emergency room physician and the family. Patient was brought to emergency room for acute onset of confusion, right-sided facial droop and right upper extremity weakness and aphasia noted by the family just prior to the arrival to emergency room.  Patient also had a mechanical fall during the night and was not able to get up from the floor, so she slept on the floor for approximately 5 hours.  Her symptoms, except for the confusion, resolved by the time patient was examined in the emergency room.  There was no fever, chills, chest pain, shortness of breath, nausea, vomiting, diarrhea, bleeding.  No new medications, no alcohol or illicit drug use. Per family, patient has been noted with poor short-term memory lately, in the past 6 months or so.  This was attributed to the patient being under a lot of stress taking care of her husband with advanced dementia. Blood test done emergency room are remarkable for hyponatremia at 129.  Creatinine level is slightly elevated at 1.03. The reminder of the CBC and CMP are unremarkable. Brain CT, reviewed by myself, shows moderate chronic ischemic white matter disease. No acute intracranial abnormality. Patient is admitted as observation to rule out TIA/stroke.  PAST  MEDICAL HISTORY:   Past Medical History:  Diagnosis Date  . Anginal pain ([place])   . Arthritis   . Cancer ([facility])    FACIAL SKIN CANCER  . Coronary artery disease   . Headache(784.0)   . Heart murmur   . Hypertension   . Pneumonia    hx of PNA    PAST SURGICAL HISTORY:  Past Surgical History:  Procedure Laterality Date  . ABDOMINAL HYSTERECTOMY    . APPENDECTOMY    . cataracts    . CORONARY ARTERY BYPASS GRAFT  10/01/2012   Procedure: CORONARY ARTERY BYPASS GRAFTING (CABG);  Surgeon: Grace Isaac, MD;  Location: [facility];  Service: Open Heart Surgery;  Laterality: N/A;  times five using Left Internal Mammary Artery   . ENDOVEIN HARVEST OF GREATER SAPHENOUS VEIN  10/01/2012   Procedure: ENDOVEIN HARVEST OF GREATER SAPHENOUS VEIN;  Surgeon: Grace Isaac, MD;  Location: [facility];  Service: Open Heart Surgery;  Laterality: Bilateral;  . KNEE SURGERY    . TONSILLECTOMY    . TOTAL KNEE ARTHROPLASTY     right knee    SOCIAL HISTORY:  Social History   Tobacco Use  . Smoking status: Never Smoker  . Smokeless tobacco: Never Used  Substance Use Topics  . Alcohol use: No    FAMILY HISTORY: No family history on file.  DRUG ALLERGIES:  Allergies  Allergen Reactions  . Omega 3  [Fish Oil] Other (See Comments)  . Neosporin [Neomycin-Bacitracin Zn-Polymyx] Rash  . Tape Rash    Blisters    REVIEW OF  SYSTEMS:   Unable to obtain due to patient being confused.  MEDICATIONS AT HOME:  Prior to Admission medications   Medication Sig Start Date End Date Taking? Authorizing Provider  aspirin EC 81 MG tablet Take 81 mg by mouth daily.   Yes [provider]  benazepril (LOTENSIN) 20 MG tablet Take 20 mg by mouth daily.   Yes [provider]  carbidopa-levodopa (SINEMET IR) 25-100 MG tablet Take 1 tablet by mouth 3 (three) times daily.   Yes [provider]  cholecalciferol (VITAMIN D) 1000 units tablet Take 1,000 Units by mouth daily.   Yes [provider]  Cyanocobalamin (B-12 IJ) Inject 1 Dose as directed every 30 (thirty) days.   Yes [provider]  furosemide (LASIX) 40 MG tablet Take 40 mg by mouth daily.   Yes [provider]  meloxicam (MOBIC) 7.5 MG tablet Take 7.5 mg by mouth daily.   Yes [provider]      PHYSICAL EXAMINATION:   VITAL SIGNS: Blood pressure 110/74, pulse 77, temperature 98.5 F (36.9 C), temperature source Oral, resp. rate 16, weight 78.5 kg (173 lb), SpO2 93 %.  GENERAL:  84 y.o.-year-old patient lying in the bed with no acute distress.  EYES: Pupils equal, round, reactive to light and accommodation. No scleral icterus. Extraocular muscles intact.  HEENT: Head atraumatic, normocephalic. Oropharynx and nasopharynx clear.  NECK:  Supple, no jugular venous distention. No thyroid enlargement, no tenderness.  LUNGS: Normal breath sounds bilaterally, no wheezing, rales,rhonchi or crepitation. No use of accessory muscles of respiration.  CARDIOVASCULAR: S1, S2 normal. No S3/S4.  ABDOMEN: Soft, nontender, nondistended. Bowel sounds present. No organomegaly or mass.  EXTREMITIES: No pedal edema, cyanosis, or clubbing.  NEUROLOGIC: Normal speech and language.  Cranial nerves II through XII are intact. Muscle strength 5/5 in all extremities. Sensation intact. Gait is stable.  Poor short-term memory is noted. PSYCHIATRIC: The patient is alert, but confused.  SKIN: No obvious rash, lesion, or ulcer.   LABORATORY PANEL:   CBC Recent Labs  Lab 05/20/18 2100  WBC 8.3  HGB 14.0  HCT 39.6  PLT 293  MCV 92.2  MCH 32.7  MCHC 35.5  RDW 13.2   ------------------------------------------------------------------------------------------------------------------  Chemistries  Recent Labs  Lab 05/20/18 2100  NA 129*  K 3.9  CL 95*  CO2 23  GLUCOSE 159*  BUN 13  CREATININE 1.03*  CALCIUM 9.0  AST 41  ALT <5*  ALKPHOS 76  BILITOT 0.9    ------------------------------------------------------------------------------------------------------------------ CrCl cannot be calculated (Unknown ideal weight.). ------------------------------------------------------------------------------------------------------------------ No results for input(s): TSH, T4TOTAL, T3FREE, THYROIDAB in the last 72 hours.  Invalid input(s): FREET3   Coagulation profile No results for input(s): INR, PROTIME in the last 168 hours. ------------------------------------------------------------------------------------------------------------------- No results for input(s): DDIMER in the last 72 hours. -------------------------------------------------------------------------------------------------------------------  Cardiac Enzymes Recent Labs  Lab 05/20/18 2100  TROPONINI <0.03   ------------------------------------------------------------------------------------------------------------------ Invalid input(s): POCBNP  ---------------------------------------------------------------------------------------------------------------  Urinalysis    Component Value Date/Time   COLORURINE YELLOW (A) 05/20/2018 2107   APPEARANCEUR CLEAR (A) 05/20/2018 2107   APPEARANCEUR Clear 06/01/2013 1452   LABSPEC 1.006 05/20/2018 2107   LABSPEC 1.015 06/01/2013 1452   PHURINE 6.0 05/20/2018 2107   GLUCOSEU NEGATIVE 05/20/2018 2107   GLUCOSEU 50 mg/dL 06/01/2013 1452   HGBUR NEGATIVE 05/20/2018 2107   BILIRUBINUR NEGATIVE 05/20/2018 2107   BILIRUBINUR Negative 06/01/2013 [place] 05/20/2018 2107   PROTEINUR NEGATIVE 05/20/2018 2107   UROBILINOGEN 0.2 09/30/2012 1930  NITRITE NEGATIVE 05/20/2018 2107   LEUKOCYTESUR NEGATIVE 05/20/2018 2107   LEUKOCYTESUR Negative 06/01/2013 1452     RADIOLOGY: Ct Head Wo Contrast  Result Date: 05/20/2018 CLINICAL DATA:  Altered level of consciousness. EXAM: CT HEAD WITHOUT CONTRAST TECHNIQUE:  Contiguous axial images were obtained from the base of the skull through the vertex without intravenous contrast. COMPARISON:  None. FINDINGS: Brain: Moderate chronic ischemic white matter disease is noted. No mass effect or midline shift is noted. Ventricular size is within normal limits. There is no evidence of mass lesion, hemorrhage or acute infarction. Vascular: No hyperdense vessel or unexpected calcification. Skull: Normal. Negative for fracture or focal lesion. Sinuses/Orbits: No acute finding. Other: None. IMPRESSION: Moderate chronic ischemic white matter disease. No acute intracranial abnormality seen. Electronically Signed   By: Marijo Conception, M.D.   On: 05/20/2018 21:48    EKG: Orders placed or performed during the hospital encounter of 05/20/18  . EKG 12-Lead  . EKG 12-Lead    IMPRESSION AND PLAN:  1.  Acute encephalopathy, likely multifactorial, related to acute renal failure and hyponatremia in a patient with underlying vascular dementia.  We will start gentle IV hydration and continue to monitor clinically closely.  Will discontinue hydrochlorothiazide due to its side effects, causing hyponatremia.  Will avoid medications with potential for sedation. Will rule out TIA/stroke.  Will check brain MRI, carotid Doppler, echocardiogram, B12 level.  Neurology is consulted for further evaluation and treatment. 2.  Acute renal failure, likely prerenal secondary to poor p.o. intake diuretic use.  We will start gentle IV hydration and discontinue hydrochlorothiazide.  Continue to monitor kidney function closely and avoid nephrotoxic medications. 3.  Hyponatremia, could be related to hydrochlorothiazide use.  Discontinue this medication and start gentle IV hydration.  Continue to monitor sodium level closely. 4.  Hypertension, stable, continue Lotensin. 5.  Parkinson's disease, stable, continue carbidopa levodopa. 6.  Vascular dementia, confirmed by CT scan of the brain.  All the records  are reviewed and case discussed with ED provider. Management plans discussed with the patient, family and they are in agreement.  CODE STATUS: FULL Code Status History    Date Active Date Inactive Code Status Order ID Comments User Context   10/03/2012 0856 10/07/2012 1430 Full Code [id number]  Grace Isaac, MD Inpatient   10/01/2012 1347 10/03/2012 0856 Full Code [id number]  Laqueta Carina, RN Inpatient   09/28/2012 1518 10/01/2012 1347 Full Code [id number]  John Giovanni, PA Inpatient       TOTAL TIME TAKING CARE OF THIS PATIENT: 45 minutes.    Amelia Jo M.D on 05/20/2018 at 11:38 PM  Between 7am to 6pm - Pager - [phone]  After 6pm go to [email or web address] - password [email or web address] [facility]  [facility]  Office  [phone]  CC: Primary care physician; Adin Hector, MD

## 2018-05-21 ENCOUNTER — Inpatient Hospital Stay: Payer: Medicare Other

## 2018-05-21 ENCOUNTER — Other Ambulatory Visit: Payer: Self-pay

## 2018-05-21 ENCOUNTER — Inpatient Hospital Stay
Admit: 2018-05-21 | Discharge: 2018-05-21 | Disposition: A | Discharge status: Home / Self Care | Payer: Medicare Other | Attending: Internal Medicine | Admitting: Internal Medicine

## 2018-05-21 DIAGNOSIS — N179 Acute kidney failure, unspecified: Secondary | ICD-10-CM | POA: Diagnosis not present

## 2018-05-21 DIAGNOSIS — R4701 Aphasia: Secondary | ICD-10-CM | POA: Diagnosis not present

## 2018-05-21 DIAGNOSIS — E871 Hypo-osmolality and hyponatremia: Secondary | ICD-10-CM | POA: Diagnosis present

## 2018-05-21 DIAGNOSIS — W06XXXA Fall from bed, initial encounter: Secondary | ICD-10-CM | POA: Diagnosis not present

## 2018-05-21 DIAGNOSIS — Z8673 Personal history of transient ischemic attack (TIA), and cerebral infarction without residual deficits: Secondary | ICD-10-CM | POA: Diagnosis not present

## 2018-05-21 DIAGNOSIS — G9349 Other encephalopathy: Secondary | ICD-10-CM | POA: Diagnosis not present

## 2018-05-21 DIAGNOSIS — E119 Type 2 diabetes mellitus without complications: Secondary | ICD-10-CM | POA: Diagnosis not present

## 2018-05-21 DIAGNOSIS — E785 Hyperlipidemia, unspecified: Secondary | ICD-10-CM | POA: Diagnosis not present

## 2018-05-21 DIAGNOSIS — Y92230 Patient room in hospital as the place of occurrence of the external cause: Secondary | ICD-10-CM | POA: Diagnosis not present

## 2018-05-21 DIAGNOSIS — T502X5A Adverse effect of carbonic-anhydrase inhibitors, benzothiadiazides and other diuretics, initial encounter: Secondary | ICD-10-CM | POA: Diagnosis not present

## 2018-05-21 DIAGNOSIS — I2510 Atherosclerotic heart disease of native coronary artery without angina pectoris: Secondary | ICD-10-CM | POA: Diagnosis not present

## 2018-05-21 DIAGNOSIS — G459 Transient cerebral ischemic attack, unspecified: Secondary | ICD-10-CM | POA: Diagnosis present

## 2018-05-21 DIAGNOSIS — M1990 Unspecified osteoarthritis, unspecified site: Secondary | ICD-10-CM | POA: Diagnosis not present

## 2018-05-21 DIAGNOSIS — R011 Cardiac murmur, unspecified: Secondary | ICD-10-CM | POA: Diagnosis not present

## 2018-05-21 DIAGNOSIS — G20 Parkinson's disease: Secondary | ICD-10-CM | POA: Diagnosis not present

## 2018-05-21 DIAGNOSIS — I10 Essential (primary) hypertension: Secondary | ICD-10-CM | POA: Diagnosis not present

## 2018-05-21 DIAGNOSIS — R29810 Facial weakness: Secondary | ICD-10-CM | POA: Diagnosis not present

## 2018-05-21 DIAGNOSIS — Z888 Allergy status to other drugs, medicaments and biological substances status: Secondary | ICD-10-CM | POA: Diagnosis not present

## 2018-05-21 DIAGNOSIS — Z91048 Other nonmedicinal substance allergy status: Secondary | ICD-10-CM | POA: Diagnosis not present

## 2018-05-21 DIAGNOSIS — Z85828 Personal history of other malignant neoplasm of skin: Secondary | ICD-10-CM | POA: Diagnosis not present

## 2018-05-21 LAB — CBC
HEMATOCRIT: 39.3 % (ref 35.0–47.0)
HEMOGLOBIN: 13.5 g/dL (ref 12.0–16.0)
MCH: 31.5 pg (ref 26.0–34.0)
MCHC: 34.4 g/dL (ref 32.0–36.0)
MCV: 91.6 fL (ref 80.0–100.0)
Platelets: 273 10*3/uL (ref 150–440)
RBC: 4.29 MIL/uL (ref 3.80–5.20)
RDW: 13.4 % (ref 11.5–14.5)
WBC: 6.8 10*3/uL (ref 3.6–11.0)

## 2018-05-21 LAB — BASIC METABOLIC PANEL
Anion gap: 12 (ref 5–15)
BUN: 15 mg/dL (ref 6–20)
CALCIUM: 8.5 mg/dL — AB (ref 8.9–10.3)
CHLORIDE: 96 mmol/L — AB (ref 101–111)
CO2: 22 mmol/L (ref 22–32)
Creatinine, Ser: 0.93 mg/dL (ref 0.44–1.00)
GFR calc Af Amer: >60 mL/min (ref >60)
GFR calc non Af Amer: 54 mL/min — ABNORMAL LOW (ref >60)
GLUCOSE: 118 mg/dL — AB (ref 65–99)
POTASSIUM: 3.3 mmol/L — AB (ref 3.5–5.1)
Sodium: 130 mmol/L — ABNORMAL LOW (ref 135–145)

## 2018-05-21 LAB — LIPID PANEL
Cholesterol: 167 mg/dL (ref 0–200)
HDL: 37 mg/dL — AB (ref >40)
LDL CALC: 101 mg/dL — AB (ref 0–99)
Total CHOL/HDL Ratio: 4.5 RATIO
Triglycerides: 145 mg/dL (ref <150)
VLDL: 29 mg/dL (ref 0–40)

## 2018-05-21 LAB — CK: CK TOTAL: 168 U/L (ref 38–234)

## 2018-05-21 LAB — HEMOGLOBIN A1C
Hgb A1c MFr Bld: 7.1 % — ABNORMAL HIGH (ref 4.8–5.6)
Mean Plasma Glucose: 157.07 mg/dL

## 2018-05-21 LAB — VITAMIN B12: Vitamin B-12: 511 pg/mL (ref 180–914)

## 2018-05-21 MED ORDER — ASPIRIN EC 81 MG PO TBEC
81.0000 mg | DELAYED_RELEASE_TABLET | Freq: Every day | ORAL | Status: DC
  Administered 2018-05-21: 81 mg via ORAL
  Filled 2018-05-21: qty 1

## 2018-05-21 MED ORDER — ACETAMINOPHEN 650 MG RE SUPP: 650.0000 mg | Freq: Four times a day (QID) | RECTAL | Status: DC | PRN

## 2018-05-21 MED ORDER — SODIUM CHLORIDE 0.9 % IV SOLN
INTRAVENOUS | Status: DC
  Administered 2018-05-21: 10:00:00 via INTRAVENOUS

## 2018-05-21 MED ORDER — VITAMIN D 1000 UNITS PO TABS
1000.0000 [IU] | ORAL_TABLET | Freq: Every day | ORAL | Status: DC
  Administered 2018-05-21 – 2018-05-23 (×3): 1000 [IU] via ORAL
  Filled 2018-05-21 (×3): qty 1

## 2018-05-21 MED ORDER — ONDANSETRON HCL 4 MG/2ML IJ SOLN: 4.0000 mg | Freq: Four times a day (QID) | INTRAMUSCULAR | Status: DC | PRN

## 2018-05-21 MED ORDER — BISACODYL 5 MG PO TBEC
5.0000 mg | DELAYED_RELEASE_TABLET | Freq: Every day | ORAL | Status: DC | PRN
  Administered 2018-05-23: 09:00:00 5 mg via ORAL
  Filled 2018-05-21: qty 1

## 2018-05-21 MED ORDER — FUROSEMIDE 40 MG PO TABS: 40.0000 mg | ORAL_TABLET | Freq: Every day | ORAL | Status: DC

## 2018-05-21 MED ORDER — ONDANSETRON HCL 4 MG PO TABS: 4.0000 mg | ORAL_TABLET | Freq: Four times a day (QID) | ORAL | Status: DC | PRN

## 2018-05-21 MED ORDER — ACETAMINOPHEN 325 MG PO TABS
650.0000 mg | ORAL_TABLET | Freq: Four times a day (QID) | ORAL | Status: DC | PRN
  Administered 2018-05-21: 05:00:00 650 mg via ORAL
  Filled 2018-05-21: qty 2

## 2018-05-21 MED ORDER — POTASSIUM CHLORIDE CRYS ER 20 MEQ PO TBCR
40.0000 meq | EXTENDED_RELEASE_TABLET | Freq: Once | ORAL | Status: AC
  Administered 2018-05-21: 40 meq via ORAL
  Filled 2018-05-21: qty 2

## 2018-05-21 MED ORDER — DOCUSATE SODIUM 100 MG PO CAPS
100.0000 mg | ORAL_CAPSULE | Freq: Two times a day (BID) | ORAL | Status: DC
  Administered 2018-05-21 – 2018-05-23 (×5): 100 mg via ORAL
  Filled 2018-05-21 (×5): qty 1

## 2018-05-21 MED ORDER — HYDROCODONE-ACETAMINOPHEN 5-325 MG PO TABS: 1.0000 | ORAL_TABLET | ORAL | Status: DC | PRN

## 2018-05-21 MED ORDER — CYANOCOBALAMIN 1000 MCG/ML IJ SOLN: INTRAMUSCULAR | Status: DC

## 2018-05-21 MED ORDER — ATORVASTATIN CALCIUM 20 MG PO TABS
40.0000 mg | ORAL_TABLET | Freq: Every day | ORAL | Status: DC
  Administered 2018-05-21 – 2018-05-22 (×2): 40 mg via ORAL
  Filled 2018-05-21 (×2): qty 2

## 2018-05-21 MED ORDER — BENAZEPRIL HCL 20 MG PO TABS
20.0000 mg | ORAL_TABLET | Freq: Every day | ORAL | Status: DC
  Administered 2018-05-21 – 2018-05-23 (×3): 20 mg via ORAL
  Filled 2018-05-21 (×4): qty 1

## 2018-05-21 MED ORDER — TRAZODONE HCL 50 MG PO TABS
25.0000 mg | ORAL_TABLET | Freq: Every evening | ORAL | Status: DC | PRN
  Filled 2018-05-21: qty 1

## 2018-05-21 MED ORDER — SODIUM CHLORIDE 0.9 % IV SOLN
Freq: Once | INTRAVENOUS | Status: AC
  Administered 2018-05-21: 01:00:00 via INTRAVENOUS

## 2018-05-21 MED ORDER — CLOPIDOGREL BISULFATE 75 MG PO TABS
75.0000 mg | ORAL_TABLET | Freq: Every day | ORAL | Status: DC
  Administered 2018-05-21 – 2018-05-22 (×2): 75 mg via ORAL
  Filled 2018-05-21 (×3): qty 1

## 2018-05-21 MED ORDER — HEPARIN SODIUM (PORCINE) 5000 UNIT/ML IJ SOLN
5000.0000 [IU] | Freq: Three times a day (TID) | INTRAMUSCULAR | Status: DC
  Administered 2018-05-21: 5000 [IU] via SUBCUTANEOUS
  Filled 2018-05-21: qty 1

## 2018-05-21 MED ORDER — ASPIRIN EC 81 MG PO TBEC
81.0000 mg | DELAYED_RELEASE_TABLET | Freq: Every day | ORAL | Status: DC
  Administered 2018-05-22 – 2018-05-23 (×2): 81 mg via ORAL
  Filled 2018-05-21 (×2): qty 1

## 2018-05-21 MED ORDER — CLOPIDOGREL BISULFATE 75 MG PO TABS: 75.0000 mg | ORAL_TABLET | Freq: Every day | ORAL | Status: DC

## 2018-05-21 MED ORDER — CARBIDOPA-LEVODOPA 25-100 MG PO TABS
1.0000 | ORAL_TABLET | Freq: Three times a day (TID) | ORAL | Status: DC
  Administered 2018-05-21 – 2018-05-23 (×6): 1 via ORAL
  Filled 2018-05-21 (×10): qty 1

## 2018-05-21 NOTE — Progress Notes (Signed)
OT Cancellation Note  Patient Details Name: Kimberly Scott MRN: [id number] DOB: Oct 23, 1928   Cancelled Treatment:    Reason Eval/Treat Not Completed: Patient at procedure or test/ unavailable. Order received, chart reviewed. Pt out of room for testing. Will re-attempt OT evaluation at later date/time as pt is available and medically appropriate.  Jeni Salles, MPH, MS, OTR/L ascom [phone] 05/21/18, 7:58 AM

## 2018-05-21 NOTE — Consult Note (Signed)
Referring Physician: Leslye Peer    Chief Complaint: Right sided weakness, difficulty with speech  HPI: Kimberly Scott is an 84 y.o. female who reports that very early yesterday morning the patient was on her way to the bed and on attempting to sit on the bed fell to the floor.  Was unable to get back up.  Stayed on the floor about 5 hours until her daughter came.  Was able to get up with assistance and go to eat.  Went to sleep afterward and slept for most of the day.  She seemed at baseline per daughter on awakening.  Her son came over and on eating dinner she became unable to feed herself and was unable to speak intelligibly.  Confused.  Patient was brought in for evaluation at that time.  Initial NIHSS of 0.  Date last known well: Date: 05/20/2018 Time last known well: Time: 18:00 tPA Given: No: Resolution of symptoms  Past Medical History:  Diagnosis Date  . Anginal pain ([place])   . Arthritis   . Cancer ([facility])    FACIAL SKIN CANCER  . Coronary artery disease   . Headache(784.0)   . Heart murmur   . Hypertension   . Pneumonia    hx of PNA    Past Surgical History:  Procedure Laterality Date  . ABDOMINAL HYSTERECTOMY    . APPENDECTOMY    . cataracts    . CORONARY ARTERY BYPASS GRAFT  10/01/2012   Procedure: CORONARY ARTERY BYPASS GRAFTING (CABG);  Surgeon: Grace Isaac, MD;  Location: [place];  Service: Open Heart Surgery;  Laterality: N/A;  times five using Left Internal Mammary Artery   . ENDOVEIN HARVEST OF GREATER SAPHENOUS VEIN  10/01/2012   Procedure: ENDOVEIN HARVEST OF GREATER SAPHENOUS VEIN;  Surgeon: Grace Isaac, MD;  Location: [place];  Service: Open Heart Surgery;  Laterality: Bilateral;  . KNEE SURGERY    . TONSILLECTOMY    . TOTAL KNEE ARTHROPLASTY     right knee   Family history: Father deceased at age 60 from a ruptured appendix.  No known medical problems.  Mother deceased with brain cancer, CAD s/p MI and stroke.  Sibling with stroke.  Social History:   reports that she has never smoked. She has never used smokeless tobacco. She reports that she does not drink alcohol or use drugs.  Allergies:  Allergies  Allergen Reactions  . Omega 3  [Fish Oil] Other (See Comments)  . Neosporin [Neomycin-Bacitracin Zn-Polymyx] Rash  . Tape Rash    Blisters    Medications:  I have reviewed the patient's current medications. Prior to Admission:  Medications Prior to Admission  Medication Sig Dispense Refill Last Dose  . ascorbic acid (VITAMIN C) 500 MG tablet Take 1,000 mg by mouth daily.     Marland Kitchen aspirin EC 81 MG tablet Take 81 mg by mouth daily.   unknown at unknown  . benazepril (LOTENSIN) 20 MG tablet Take 20 mg by mouth daily.   unknown at unknown  . calcium carbonate (TUMS - DOSED IN MG ELEMENTAL CALCIUM) 500 MG chewable tablet Chew 1 tablet by mouth daily.     . carbidopa-levodopa (SINEMET IR) 25-100 MG tablet Take 1 tablet by mouth 3 (three) times daily.   unknown at unknown  . cholecalciferol (VITAMIN D) 1000 units tablet Take 1,000 Units by mouth daily.   unknown at unknown  . Cyanocobalamin (B-12 IJ) Inject 1 Dose as directed every 30 (thirty) days.   Past Month  at Unknown time  . furosemide (LASIX) 40 MG tablet Take 40 mg by mouth daily.   unknown at unknown  . gabapentin (NEURONTIN) 100 MG capsule Take 100 mg by mouth 2 (two) times daily.     . hydrochlorothiazide (HYDRODIURIL) 25 MG tablet Take 25 mg by mouth daily.     . meloxicam (MOBIC) 7.5 MG tablet Take 7.5 mg by mouth daily.   unknown at unknown  . triamcinolone ointment (KENALOG) 0.1 % Apply 1 application topically 2 (two) times daily as needed.      Scheduled: . [START ON 05/22/2018] aspirin EC  81 mg Oral Daily  . atorvastatin  40 mg Oral q1800  . benazepril  20 mg Oral Daily  . carbidopa-levodopa  1 tablet Oral TID  . cholecalciferol  1,000 Units Oral Daily  . [START ON 06/11/2018] cyanocobalamin   Intramuscular Q30 days  . docusate sodium  100 mg Oral BID  . potassium  chloride  40 mEq Oral Once    ROS: History obtained from the patient  General ROS: negative for - chills, fatigue, fever, night sweats, weight gain or weight loss Psychological ROS: negative for - behavioral disorder, hallucinations, memory difficulties, mood swings or suicidal ideation Ophthalmic ROS: negative for - blurry vision, double vision, eye pain or loss of vision ENT ROS: negative for - epistaxis, nasal discharge, oral lesions, sore throat, tinnitus or vertigo Allergy and Immunology ROS: negative for - hives or itchy/watery eyes Hematological and Lymphatic ROS: negative for - bleeding problems, bruising or swollen lymph nodes Endocrine ROS: negative for - galactorrhea, hair pattern changes, polydipsia/polyuria or temperature intolerance Respiratory ROS: negative for - cough, hemoptysis, shortness of breath or wheezing Cardiovascular ROS: negative for - chest pain, dyspnea on exertion, edema or irregular heartbeat Gastrointestinal ROS: negative for - abdominal pain, diarrhea, hematemesis, nausea/vomiting or stool incontinence Genito-Urinary ROS: negative for - dysuria, hematuria, incontinence or urinary frequency/urgency Musculoskeletal ROS: right sided pain Neurological ROS: as noted in HPI Dermatological ROS: negative for rash and skin lesion changes  Physical Examination: Blood pressure (!) 124/92, pulse 74, temperature 98.5 F (36.9 C), temperature source Oral, resp. rate 18, height 5\' 2"  (1.575 m), weight 79.6 kg (175 lb 6.4 oz), SpO2 95 %.  HEENT-  Normocephalic, no lesions, without obvious abnormality.  Normal external eye and conjunctiva.  Normal TM's bilaterally.  Normal auditory canals and external ears. Normal external nose, mucus membranes and septum.  Normal pharynx. Cardiovascular- S1, S2 normal, pulses palpable throughout   Lungs- chest clear, no wheezing, rales, normal symmetric air entry Abdomen- soft, non-tender; bowel sounds normal; no masses,  no  organomegaly Extremities- no edema Lymph-no adenopathy palpable Musculoskeletal-pain on palpation of the right particularly at the shoulder  Skin-warm and dry, no hyperpigmentation, vitiligo, or suspicious lesions  Neurological Examination   Mental Status: Alert, oriented, thought content appropriate.  Speech fluent without evidence of aphasia.  Able to follow 3 step commands without difficulty. Cranial Nerves: II: Discs flat bilaterally; Visual fields grossly normal, pupils equal, round, reactive to light and accommodation III,IV, VI: ptosis not present, extra-ocular motions intact bilaterally V,VII: smile symmetric, facial light touch sensation normal bilaterally VIII: hearing normal bilaterally IX,X: gag reflex present XI: bilateral shoulder shrug XII: midline tongue extension Motor: Able to lift all extremities against gravity.  Limited on the right due to pain.  Drift noted in the RUE without pronation Sensory: Pinprick and light touch intact throughout, bilaterally with pain on touching the right side Deep Tendon Reflexes: 2+ in  the upper extremities and absent in the lower extremities Plantars: Right: mute   Left: mute Cerebellar: Normal finger-to-nose testing bilaterally with minimal tremor Gait: not tested due to safety concerns    Laboratory Studies:  Basic Metabolic Panel: Recent Labs  Lab 05/20/18 2100 05/21/18 0401  NA 129* 130*  K 3.9 3.3*  CL 95* 96*  CO2 23 22  GLUCOSE 159* 118*  BUN 13 15  CREATININE 1.03* 0.93  CALCIUM 9.0 8.5*    Liver Function Tests: Recent Labs  Lab 05/20/18 2100  AST 41  ALT <5*  ALKPHOS 76  BILITOT 0.9  PROT 7.0  ALBUMIN 3.9   No results for input(s): LIPASE, AMYLASE in the last 168 hours. No results for input(s): AMMONIA in the last 168 hours.  CBC: Recent Labs  Lab 05/20/18 2100 05/21/18 0401  WBC 8.3 6.8  HGB 14.0 13.5  HCT 39.6 39.3  MCV 92.2 91.6  PLT 293 273    Cardiac Enzymes: Recent Labs  Lab  05/20/18 2100 05/21/18 0401  CKTOTAL 192 168  TROPONINI <0.03  --     BNP: Invalid input(s): POCBNP  CBG: No results for input(s): GLUCAP in the last 168 hours.  Microbiology: Results for orders placed or performed during the hospital encounter of 09/28/12  Surgical pcr screen     Status: None   Collection Time: 09/30/12 11:53 PM  Result Value Ref Range Status   MRSA, PCR NEGATIVE NEGATIVE Final   Staphylococcus aureus NEGATIVE NEGATIVE Final    Comment:        The Xpert SA Assay (FDA approved for NASAL specimens in patients over 58 years of age), is one component of a comprehensive surveillance program.  Test performance has been validated by [employer] for patients greater than or equal to 70 year old. It is not intended to diagnose infection nor to guide or monitor treatment.    Coagulation Studies: No results for input(s): LABPROT, INR in the last 72 hours.  Urinalysis:  Recent Labs  Lab 05/20/18 2107  COLORURINE YELLOW*  LABSPEC 1.006  PHURINE 6.0  GLUCOSEU NEGATIVE  HGBUR NEGATIVE  BILIRUBINUR NEGATIVE  KETONESUR NEGATIVE  PROTEINUR NEGATIVE  NITRITE NEGATIVE  LEUKOCYTESUR NEGATIVE    Lipid Panel:    Component Value Date/Time   CHOL 167 05/21/2018 0401   TRIG 145 05/21/2018 0401   HDL 37 (L) 05/21/2018 0401   CHOLHDL 4.5 05/21/2018 0401   VLDL 29 05/21/2018 0401   LDLCALC 101 (H) 05/21/2018 0401    HgbA1C:  Lab Results  Component Value Date   HGBA1C 7.1 (H) 05/21/2018    Urine Drug Screen:  No results found for: LABOPIA, COCAINSCRNUR, LABBENZ, AMPHETMU, THCU, LABBARB  Alcohol Level: No results for input(s): ETH in the last 168 hours.  Other results: EKG: sinus rhythm at 74 bpm.  Imaging: Dg Chest 2 View  Result Date: 05/21/2018 CLINICAL DATA:  Hyponatremia, hypertension, coronary disease, altered mental status EXAM: CHEST - 2 VIEW COMPARISON:  10/31/2012 FINDINGS: Remote coronary bypass changes. Atherosclerosis of aorta. Stable  heart size and vascularity. Negative for focal pneumonia, collapse or consolidation. Negative for edema, effusion or pneumothorax. Trachea is midline. IMPRESSION: Stable chest exam without interval change or acute process. Remote coronary bypass Thoracic aortic atherosclerosis Electronically Signed   By: Jerilynn Mages.  Shick M.D.   On: 05/21/2018 08:27   Ct Head Wo Contrast  Result Date: 05/20/2018 CLINICAL DATA:  Altered level of consciousness. EXAM: CT HEAD WITHOUT CONTRAST TECHNIQUE: Contiguous axial images were obtained  from the base of the skull through the vertex without intravenous contrast. COMPARISON:  None. FINDINGS: Brain: Moderate chronic ischemic white matter disease is noted. No mass effect or midline shift is noted. Ventricular size is within normal limits. There is no evidence of mass lesion, hemorrhage or acute infarction. Vascular: No hyperdense vessel or unexpected calcification. Skull: Normal. Negative for fracture or focal lesion. Sinuses/Orbits: No acute finding. Other: None. IMPRESSION: Moderate chronic ischemic white matter disease. No acute intracranial abnormality seen. Electronically Signed   By: Marijo Conception, M.D.   On: 05/20/2018 21:48   Mr Brain Wo Contrast  Result Date: 05/21/2018 CLINICAL DATA:  Altered level of consciousness. EXAM: MRI HEAD WITHOUT CONTRAST TECHNIQUE: Multiplanar, multiecho pulse sequences of the brain and surrounding structures were obtained without intravenous contrast. COMPARISON:  CT 05/20/2018.  MRI head 03/11/2015 FINDINGS: Brain: Mild atrophy without hydrocephalus. Negative for acute infarct. Moderate chronic microvascular ischemic changes throughout the white matter. Mild chronic ischemia in the pons and cerebellum. Scattered foci of chronic microhemorrhage are present including the cerebellum bilaterally, thalamus bilaterally. This appears to have progressed since 2016. No mass edema or midline shift. Vascular: Normal arterial flow voids Skull and upper  cervical spine: Negative Sinuses/Orbits: Mild mucosal edema paranasal sinuses. Bilateral cataract removal Other: None IMPRESSION: Negative for acute infarct. Atrophy and moderate chronic microvascular ischemia. Progression of multiple foci of chronic microhemorrhage in the brain. This is likely related to the patient's history of hypertension. Electronically Signed   By: Franchot Gallo M.D.   On: 05/21/2018 12:42   US Carotid Bilateral  Result Date: 05/21/2018 CLINICAL DATA:  84 year old female with symptoms of transient ischemic attack EXAM: BILATERAL CAROTID DUPLEX ULTRASOUND TECHNIQUE: Pearline Cables scale imaging, color Doppler and duplex ultrasound were performed of bilateral carotid and vertebral arteries in the neck. COMPARISON:  None. FINDINGS: Criteria: Quantification of carotid stenosis is based on velocity parameters that correlate the residual internal carotid diameter with NASCET-based stenosis levels, using the diameter of the distal internal carotid lumen as the denominator for stenosis measurement. The following velocity measurements were obtained: RIGHT ICA: 60/7 cm/sec CCA: 67/2 cm/sec SYSTOLIC ICA/CCA RATIO:  0.8 ECA:  137 cm/sec LEFT ICA: 61/8 cm/sec CCA: 09/4 cm/sec SYSTOLIC ICA/CCA RATIO:  0.8 ECA:  97 cm/sec RIGHT CAROTID ARTERY: Mild heterogeneous atherosclerotic plaque in the carotid bifurcation extending into the proximal internal carotid artery. By peak systolic velocity criteria, the estimated stenosis remains less than 50%. RIGHT VERTEBRAL ARTERY:  Patent with normal antegrade flow. LEFT CAROTID ARTERY: Trace heterogeneous atherosclerotic plaque in the carotid bifurcation extending into the proximal internal carotid artery. By peak systolic velocity criteria, the estimated stenosis remains less than 50%. LEFT VERTEBRAL ARTERY:  Patent with antegrade flow. IMPRESSION: 1. Mild (1-49%) stenosis proximal right internal carotid artery secondary to trace heterogeneous atherosclerotic plaque. 2. Mild  (1-49%) stenosis proximal left internal carotid artery secondary to trace heterogeneous atherosclerotic plaque. 3. Vertebral arteries are patent with antegrade flow. Signed, Criselda Peaches, MD [facility] Radiology Electronically Signed   By: Jacqulynn Cadet M.D.   On: 05/21/2018 11:18    Assessment: 84 y.o. female presenting with right sided weakness and difficulty with speech.  Patient has improved significantly but continues to have some mild confusion.  MRI of the brain reviewed and shows no acute changes.  Can not rule out TIA.  Blood work also reveals hyponatremia and some ARF which is likely the etiology of the remaining confusion that could continue to improve.  Carotid dopplers show no evidence of hemodynamically significant stenosis.  Echocardiogram pending.  A1c 7.1, LDL 101.   Patient on ASA prior to admission.  Stroke Risk Factors - hypertension  Plan: 1. Statin for lipid management with target LDL<70. 2. Blood sugar management with target A1c<7.0 3. PT consult, OT consult, Speech consult 4. Echocardiogram pending 5. Prophylactic therapy-Antiplatelet med: Plavix - dose 75mg  daily.  Would continue along with ASA at 81mg  for one mnth before discontinuing ASA. 6. Agree with addressing metabolic issues.  Cognition may lag behind correction of lab work.   7. Telemetry monitoring 8. Frequent neuro checks    Alexis Goodell, MD Neurology [phone] 05/21/2018, 1:58 PM

## 2018-05-21 NOTE — Progress Notes (Signed)
OT Cancellation Note  Patient Details Name: Kimberly Scott MRN: [id number] DOB: September 14, 1928   Cancelled Treatment:    Reason Eval/Treat Not Completed: Patient at procedure or test/ unavailable. 2nd attempt, pt still out of room for testing. Will re-attempt this afternoon as pt is available, medically appropriate, and as schedule permits.   Jeni Salles, MPH, MS, OTR/L ascom [phone] 05/21/18, 10:54 AM

## 2018-05-21 NOTE — Progress Notes (Signed)
Patient ID: Kimberly Scott, female   DOB: 1928/07/29, 84 y.o.   MRN: [id number]  Kimberly Scott PROGRESS NOTE  Kimberly Scott:[id number] DOB: June 18, 1928 DOA: 05/20/2018 PCP: Kimberly Hector, MD  HPI/Subjective: Patient was on the floor overnight and could not move her legs.  She initially slid off the bed to the floor.  They ended up taking her out to a restaurant and she was nodding off there.  Around 7 PM lasting about 10 to 30 minutes she had an understandable speech.  She has been having some headaches and her blood pressures been on the higher side as outpatient.  Objective: Vitals:   05/21/18 0713 05/21/18 0913  BP: (!) 147/70 (!) 124/92  Pulse: 69 74  Resp: 18 18  Temp: 98.5 F (36.9 C)   SpO2: 93% 95%    Filed Weights   05/20/18 2059 05/21/18 0107  Weight: 78.5 kg (173 lb) 79.6 kg (175 lb 6.4 oz)    ROS: Review of Systems  Constitutional: Negative for chills and fever.  Eyes: Negative for blurred vision.  Respiratory: Negative for cough and shortness of breath.   Cardiovascular: Negative for chest pain.  Gastrointestinal: Negative for abdominal pain, constipation, diarrhea, nausea and vomiting.  Genitourinary: Negative for dysuria.  Musculoskeletal: Negative for joint pain.  Neurological: Positive for headaches. Negative for dizziness.   Exam: Physical Exam  Constitutional: She is oriented to person, place, and time.  HENT:  Nose: No mucosal edema.  Mouth/Throat: No oropharyngeal exudate or posterior oropharyngeal edema.  Eyes: Pupils are equal, round, and reactive to light. Conjunctivae, EOM and lids are normal.  Neck: No JVD present. Carotid bruit is not present. No edema present. No thyroid mass and no thyromegaly present.  Cardiovascular: S1 normal and S2 normal. Exam reveals no gallop.  No murmur heard. Pulses:      Dorsalis pedis pulses are 2+ on the right side, and 2+ on the left side.  Respiratory: No respiratory distress. She has no  wheezes. She has no rhonchi. She has no rales.  GI: Soft. Bowel sounds are normal. There is no tenderness.  Musculoskeletal:       Right ankle: She exhibits swelling.       Left ankle: She exhibits swelling.  Lymphadenopathy:    She has no cervical adenopathy.  Neurological: She is alert and oriented to person, place, and time. No cranial nerve deficit.  Skin: Skin is warm. No rash noted. Nails show no clubbing.  Psychiatric: She has a normal mood and affect.      Data Reviewed: Basic Metabolic Panel: Recent Labs  Lab 05/20/18 2100 05/21/18 0401  NA 129* 130*  K 3.9 3.3*  CL 95* 96*  CO2 23 22  GLUCOSE 159* 118*  BUN 13 15  CREATININE 1.03* 0.93  CALCIUM 9.0 8.5*   Liver Function Tests: Recent Labs  Lab 05/20/18 2100  AST 41  ALT <5*  ALKPHOS 76  BILITOT 0.9  PROT 7.0  ALBUMIN 3.9   CBC: Recent Labs  Lab 05/20/18 2100 05/21/18 0401  WBC 8.3 6.8  HGB 14.0 13.5  HCT 39.6 39.3  MCV 92.2 91.6  PLT 293 273   Cardiac Enzymes: Recent Labs  Lab 05/20/18 2100 05/21/18 0401  CKTOTAL 192 168  TROPONINI <0.03  --       Studies: Dg Chest 2 View  Result Date: 05/21/2018 CLINICAL DATA:  Hyponatremia, hypertension, coronary disease, altered mental status EXAM: CHEST - 2 VIEW COMPARISON:  10/31/2012 FINDINGS:  Remote coronary bypass changes. Atherosclerosis of aorta. Stable heart size and vascularity. Negative for focal pneumonia, collapse or consolidation. Negative for edema, effusion or pneumothorax. Trachea is midline. IMPRESSION: Stable chest exam without interval change or acute process. Remote coronary bypass Thoracic aortic atherosclerosis Electronically Signed   By: Jerilynn Mages.  Shick M.D.   On: 05/21/2018 08:27   Ct Head Wo Contrast  Result Date: 05/20/2018 CLINICAL DATA:  Altered level of consciousness. EXAM: CT HEAD WITHOUT CONTRAST TECHNIQUE: Contiguous axial images were obtained from the base of the skull through the vertex without intravenous contrast.  COMPARISON:  None. FINDINGS: Brain: Moderate chronic ischemic white matter disease is noted. No mass effect or midline shift is noted. Ventricular size is within normal limits. There is no evidence of mass lesion, hemorrhage or acute infarction. Vascular: No hyperdense vessel or unexpected calcification. Skull: Normal. Negative for fracture or focal lesion. Sinuses/Orbits: No acute finding. Other: None. IMPRESSION: Moderate chronic ischemic white matter disease. No acute intracranial abnormality seen. Electronically Signed   By: Marijo Conception, M.D.   On: 05/20/2018 21:48   Mr Brain Wo Contrast  Result Date: 05/21/2018 CLINICAL DATA:  Altered level of consciousness. EXAM: MRI HEAD WITHOUT CONTRAST TECHNIQUE: Multiplanar, multiecho pulse sequences of the brain and surrounding structures were obtained without intravenous contrast. COMPARISON:  CT 05/20/2018.  MRI head 03/11/2015 FINDINGS: Brain: Mild atrophy without hydrocephalus. Negative for acute infarct. Moderate chronic microvascular ischemic changes throughout the white matter. Mild chronic ischemia in the pons and cerebellum. Scattered foci of chronic microhemorrhage are present including the cerebellum bilaterally, thalamus bilaterally. This appears to have progressed since 2016. No mass edema or midline shift. Vascular: Normal arterial flow voids Skull and upper cervical spine: Negative Sinuses/Orbits: Mild mucosal edema paranasal sinuses. Bilateral cataract removal Other: None IMPRESSION: Negative for acute infarct. Atrophy and moderate chronic microvascular ischemia. Progression of multiple foci of chronic microhemorrhage in the brain. This is likely related to the patient's history of hypertension. Electronically Signed   By: Franchot Gallo M.D.   On: 05/21/2018 12:42   US Carotid Bilateral  Result Date: 05/21/2018 CLINICAL DATA:  84 year old female with symptoms of transient ischemic attack EXAM: BILATERAL CAROTID DUPLEX ULTRASOUND TECHNIQUE:  Pearline Cables scale imaging, color Doppler and duplex ultrasound were performed of bilateral carotid and vertebral arteries in the neck. COMPARISON:  None. FINDINGS: Criteria: Quantification of carotid stenosis is based on velocity parameters that correlate the residual internal carotid diameter with NASCET-based stenosis levels, using the diameter of the distal internal carotid lumen as the denominator for stenosis measurement. The following velocity measurements were obtained: RIGHT ICA: 60/7 cm/sec CCA: 39/7 cm/sec SYSTOLIC ICA/CCA RATIO:  0.8 ECA:  137 cm/sec LEFT ICA: 61/8 cm/sec CCA: 67/3 cm/sec SYSTOLIC ICA/CCA RATIO:  0.8 ECA:  97 cm/sec RIGHT CAROTID ARTERY: Mild heterogeneous atherosclerotic plaque in the carotid bifurcation extending into the proximal internal carotid artery. By peak systolic velocity criteria, the estimated stenosis remains less than 50%. RIGHT VERTEBRAL ARTERY:  Patent with normal antegrade flow. LEFT CAROTID ARTERY: Trace heterogeneous atherosclerotic plaque in the carotid bifurcation extending into the proximal internal carotid artery. By peak systolic velocity criteria, the estimated stenosis remains less than 50%. LEFT VERTEBRAL ARTERY:  Patent with antegrade flow. IMPRESSION: 1. Mild (1-49%) stenosis proximal right internal carotid artery secondary to trace heterogeneous atherosclerotic plaque. 2. Mild (1-49%) stenosis proximal left internal carotid artery secondary to trace heterogeneous atherosclerotic plaque. 3. Vertebral arteries are patent with antegrade flow. Signed, Criselda Peaches, MD Vascular [facility] Interventional Radiology  [facility] Radiology Electronically Signed   By: Jacqulynn Cadet M.D.   On: 05/21/2018 11:18    Scheduled Meds: . [START ON 05/22/2018] aspirin EC  81 mg Oral Daily  . atorvastatin  40 mg Oral q1800  . benazepril  20 mg Oral Daily  . carbidopa-levodopa  1 tablet Oral TID  . cholecalciferol  1,000 Units Oral Daily  . [START ON 06/11/2018]  cyanocobalamin   Intramuscular Q30 days  . docusate sodium  100 mg Oral BID  . heparin  5,000 Units Subcutaneous Q8H  . potassium chloride  40 mEq Oral Once   Continuous Infusions:  Assessment/Plan:  1. Acute encephalopathy and speech abnormality.  MRI of the brain was negative for acute stroke but did show chronic microvascular ischemia.  Progression of multiple foci of chronic microhemorrhage in the brain.  Await neurology consultation.  I initially switch the patient to Plavix thinking this was an acute stroke.  With the microhemorrhage I will switch back to aspirin and try to control blood pressure better.  Continue the newly added atorvastatin.  Await physical therapy consultation.  Continue telemetry monitoring and await echocardiogram. 2. Hyponatremia.  Stop hydrochlorothiazide and IV fluids and check a sodium tomorrow morning. 3. Essential hypertension on benazepril 4. Hyperlipidemia unspecified.  LDL 101.  Goal less than 70.  Start atorvastatin. 5. Fall.  CPK added on and is 168.  Physical therapy evaluation.  Code Status:     Code Status Orders  (From admission, onward)        Start     Ordered   05/21/18 0107  Full code  Continuous     05/21/18 0106    Code Status History    Date Active Date Inactive Code Status Order ID Comments User Context   10/03/2012 0856 10/07/2012 1430 Full Code [id number]  Grace Isaac, MD Inpatient   10/01/2012 1347 10/03/2012 0856 Full Code [id number]  Laqueta Carina, RN Inpatient   09/28/2012 1518 10/01/2012 1347 Full Code [id number]  John Giovanni, PA Inpatient    Advance Directive Documentation     Most Recent Value  Type of Advance Directive  Living will, Healthcare Power of Attorney  Pre-existing out of facility DNR order (yellow form or pink MOST form)  -  "MOST" Form in Place?  -     Family Communication: Spoke with family earlier At the bedside Disposition Plan: To be determined  Consultants:  Neurology  Time spent: 28  minutes  [place]

## 2018-05-21 NOTE — Progress Notes (Signed)
OT Cancellation Note  Patient Details Name: Kimberly Scott MRN: [id number] DOB: 1928-07-03   Cancelled Treatment:    Reason Eval/Treat Not Completed: Patient at procedure or test/ unavailable. On 3rd attempt to evaluate, pt receiving an echocardiogram. Will re-attempt next date as pt is available and medically appropriate.   Jeni Salles, MPH, MS, OTR/L ascom [phone] 05/21/18, 4:10 PM

## 2018-05-21 NOTE — ED Notes (Signed)
hospitalist in to see pt.

## 2018-05-21 NOTE — Evaluation (Signed)
Physical Therapy Evaluation Patient Details Name: Kimberly Scott MRN: [id number] DOB: 1928-01-18 Today's Date: 05/21/2018   History of Present Illness  Patient is an 84 year old female admitted for TIA s/p fall and c/o facial droop and AMS.  PMH includes PD, Htn, CA and arthitis.  Clinical Impression  Patient is an 84 year old female who lives in a one story home with her husband.  Pt is independent at baseline with use of SPC though family states that pt should have been using her RW.  Pt has had 2 falls in the past 6 months and has presented with a decline in ST memory.  Pt is in bed upon PT arrival and able to perform bed mobility with unilateral hand held assist, sitting at EOB independently.  Pt requires min a for STS transfer with heavy VC's for proper use of RW.  She was able to ambulate 40 ft in room with RW and close SBA, presenting with gait deviations indicative of fall risk with dynamic mobility.  She also presents with mild loss of strength bilaterally, though no significant asymmetry of strength was present.  Pt able to complete UE coordination testing with no deficits, though she did have some difficulty following directions.  Pt will continue to benefit from skilled Pt with focus on strength, balance and fall prevention, proper use of AD and functional mobility.    Follow Up Recommendations Home health PT    Equipment Recommendations  None recommended by PT    Recommendations for Other Services       Precautions / Restrictions Precautions Precautions: Fall Precaution Comments: HFR, recent fall Restrictions Weight Bearing Restrictions: No      Mobility  Bed Mobility Overal bed mobility: Needs Assistance Bed Mobility: Supine to Sit     Supine to sit: Min assist     General bed mobility comments: Unilateral hand held assist to initiate supine to sit. Scoots to EOB independently.  Transfers Overall transfer level: Needs assistance Equipment used: Rolling  walker (2 wheeled) Transfers: Sit to/from Stand Sit to Stand: Min assist         General transfer comment: Min A to initiate standing.  Pt able to use bilateral UE to push from bedside with VC's.  Ambulation/Gait Ambulation/Gait assistance: Min guard Gait Distance (Feet): 40 Feet Assistive device: Rolling walker (2 wheeled)     Gait velocity interpretation: <1.8 ft/sec, indicate of risk for recurrent falls General Gait Details: Low foot clearance, decreased hip and knee flexion, short shuffling steps.  PT provided VC's for proper use of RW and pt was able to follow.    Stairs            Wheelchair Mobility    Modified Rankin (Stroke Patients Only)       Balance Overall balance assessment: Needs assistance Sitting-balance support: Bilateral upper extremity supported;Feet supported Sitting balance-Leahy Scale: Good     Standing balance support: Bilateral upper extremity supported Standing balance-Leahy Scale: Fair                               Pertinent Vitals/Pain Pain Assessment: No/denies pain    Home Living Family/patient expects to be discharged to:: Private residence Living Arrangements: Spouse/significant other(Pt is caregiver for her husband.) Available Help at Discharge: Family;Available 24 hours/day([place]ter) Type of Home: House Home Access: Stairs to enter Entrance Stairs-Rails: Right Entrance Stairs-Number of Steps: 3 Home Layout: One level Home Equipment: Gilford Rile -  2 wheels;Cane - single point      Prior Function Level of Independence: Independent with assistive device(s)         Comments: Uses SPC, has a RW but has not been using.     Hand Dominance   Dominant Hand: Right    Extremity/Trunk Assessment   Upper Extremity Assessment Upper Extremity Assessment: Overall WFL for tasks assessed(Grossly 4-/5 bilaterally.  Reports no N/T.)    Lower Extremity Assessment Lower Extremity Assessment: Overall WFL for tasks  assessed(Grossly 4/5 bilaterally.  No N/T reported.)    Cervical / Trunk Assessment Cervical / Trunk Assessment: Normal  Communication   Communication: No difficulties  Cognition Arousal/Alertness: Awake/alert Behavior During Therapy: WFL for tasks assessed/performed Overall Cognitive Status: History of cognitive impairments - at baseline                                 General Comments: Pt presents with some difficulty following directions and with ST memory, which family states has been progressing for the past 6 mos.        General Comments      Exercises General Exercises - Lower Extremity Ankle Circles/Pumps: 20 reps;Both;Seated;AROM Short Arc Quad: AAROM;Both;10 reps;Seated Hip ABduction/ADduction: AAROM;Both;10 reps;Seated Straight Leg Raises: AAROM;5 reps;Seated;Both   Assessment/Plan    PT Assessment Patient needs continued PT services  PT Problem List Decreased strength;Decreased mobility;Decreased balance;Decreased knowledge of use of DME;Decreased activity tolerance;Decreased cognition       PT Treatment Interventions DME instruction;Therapeutic activities;Cognitive remediation;Gait training;Therapeutic exercise;Patient/family education;Stair training;Balance training;Functional mobility training;Neuromuscular re-education    PT Goals (Current goals can be found in the Care Plan section)  Acute Rehab PT Goals PT Goal Formulation: Patient unable to participate in goal setting    Frequency Min 2X/week   Barriers to discharge        Co-evaluation               AM-PAC PT "6 Clicks" Daily Activity  Outcome Measure Difficulty turning over in bed (including adjusting bedclothes, sheets and blankets)?: A Little Difficulty moving from lying on back to sitting on the side of the bed? : A Little Difficulty sitting down on and standing up from a chair with arms (e.g., wheelchair, bedside commode, etc,.)?: A Little Help needed moving to and from a  bed to chair (including a wheelchair)?: A Little Help needed walking in hospital room?: A Little Help needed climbing 3-5 steps with a railing? : A Little 6 Click Score: 18    End of Session Equipment Utilized During Treatment: Gait belt Activity Tolerance: Patient tolerated treatment well Patient left: in chair;with chair alarm set;with call bell/phone within reach Nurse Communication: Mobility status PT Visit Diagnosis: Unsteadiness on feet (R26.81);Muscle weakness (generalized) (M62.81)    Time: 1400-1440 PT Time Calculation (min) (ACUTE ONLY): 40 min   Charges:   PT Evaluation $PT Eval Moderate Complexity: 1 Mod PT Treatments $Therapeutic Exercise: 8-22 mins   PT G Codes:   PT G-Codes **NOT FOR INPATIENT CLASS** Functional Assessment Tool Used: AM-PAC 6 Clicks Basic Mobility   Roxanne Gates, PT, DPT   Roxanne Gates 05/21/2018, 2:51 PM

## 2018-05-21 NOTE — Care Management Note (Addendum)
Case Management Note  Patient Details  Name: ONIYA MANDARINO MRN: [id number] Date of Birth: Jun 01, 1928  Subjective/Objective:      RNCM consulted on patient since PT had recommended [facility] services. RNCM spoke with patient as well as multiple family members, daughter Tye [place] [phone] was primary decision make if patient would become incapable. Patient lives at home with her husband who suffers from dementia and patient is his primary provider. Patient has had multiple falls lately but per family is able to complete her activities of daily living for the most part. Uses a cane and a walker in the home. Family is able to provide transport. Used [facility] in the past and would like to be established with them again. Preliminary referral placed with Corene Cornea from [facility]. PCP is Dr Caryl Comes. Medications are typically delivered via [facility] but also can pick up medications at [facility] if needed.                  Action/Plan: RNCM to continue to follow and communicate with [facility] for discharge needs. OT pending    Expected Discharge Date:                  Expected Discharge Plan:     In-House Referral:     Discharge planning Services     Post Acute Care Choice:    Choice offered to:     DME Arranged:    DME Agency:     HH Arranged:    HH Agency:     Status of Service:     If discussed at [employer] of Stay Meetings, dates discussed:    Additional Comments:  Latanya Maudlin, RN 05/21/2018, 3:15 PM

## 2018-05-21 NOTE — Progress Notes (Signed)
*  PRELIMINARY RESULTS* Echocardiogram 2D Echocardiogram has been performed.  Sherrie Sport 05/21/2018, 4:39 PM

## 2018-05-22 DIAGNOSIS — G459 Transient cerebral ischemic attack, unspecified: Secondary | ICD-10-CM | POA: Diagnosis not present

## 2018-05-22 LAB — BASIC METABOLIC PANEL
ANION GAP: 9 (ref 5–15)
BUN: 14 mg/dL (ref 8–23)
CALCIUM: 8.7 mg/dL — AB (ref 8.9–10.3)
CO2: 23 mmol/L (ref 22–32)
CREATININE: 0.9 mg/dL (ref 0.44–1.00)
Chloride: 94 mmol/L — ABNORMAL LOW (ref 98–111)
GFR, EST NON AFRICAN AMERICAN: 56 mL/min — AB (ref >60)
Glucose, Bld: 113 mg/dL — ABNORMAL HIGH (ref 70–99)
Potassium: 4.1 mmol/L (ref 3.5–5.1)
SODIUM: 126 mmol/L — AB (ref 135–145)

## 2018-05-22 LAB — ECHOCARDIOGRAM COMPLETE
HEIGHTINCHES: 62 in
WEIGHTICAEL: 2806.4 [oz_av]

## 2018-05-22 LAB — SODIUM, URINE, RANDOM: SODIUM UR: 76 mmol/L

## 2018-05-22 LAB — GLUCOSE, CAPILLARY: GLUCOSE-CAPILLARY: 110 mg/dL — AB (ref 70–99)

## 2018-05-22 LAB — OSMOLALITY, URINE: OSMOLALITY UR: 382 mosm/kg (ref 300–900)

## 2018-05-22 MED ORDER — CLOPIDOGREL BISULFATE 75 MG PO TABS
75.0000 mg | ORAL_TABLET | Freq: Every day | ORAL | Status: DC
  Administered 2018-05-22 – 2018-05-23 (×2): 75 mg via ORAL
  Filled 2018-05-22 (×2): qty 1

## 2018-05-22 NOTE — Progress Notes (Signed)
Per lab need UA for potassium lab.  Clean bedpan in room and will use next time pt needs to void. Dorna Bloom RN

## 2018-05-22 NOTE — Plan of Care (Signed)
  Problem: Education: Goal: Knowledge of General Education information will improve Outcome: Progressing   Problem: Health Behavior/Discharge Planning: Goal: Ability to manage health-related needs will improve Outcome: Progressing   Problem: Clinical Measurements: Goal: Ability to maintain clinical measurements within normal limits will improve Outcome: Progressing Goal: Will remain free from infection Outcome: Progressing Goal: Diagnostic test results will improve Outcome: Progressing Goal: Respiratory complications will improve Outcome: Progressing Goal: Cardiovascular complication will be avoided Outcome: Progressing   Problem: Activity: Goal: Risk for activity intolerance will decrease Outcome: Progressing   Problem: Nutrition: Goal: Adequate nutrition will be maintained Outcome: Progressing   Problem: Coping: Goal: Level of anxiety will decrease Outcome: Progressing   Problem: Elimination: Goal: Will not experience complications related to bowel motility Outcome: Progressing Goal: Will not experience complications related to urinary retention Outcome: Progressing   Problem: Pain Managment: Goal: General experience of comfort will improve Outcome: Progressing   Problem: Safety: Goal: Ability to remain free from injury will improve Outcome: Progressing   Problem: Skin Integrity: Goal: Risk for impaired skin integrity will decrease Outcome: Progressing   Problem: Education: Goal: Knowledge of secondary prevention will improve Outcome: Progressing

## 2018-05-22 NOTE — Progress Notes (Addendum)
Subjective: No new neurological complaints.  Sitting in chair.  Objective: Current vital signs: BP 135/71 (BP Location: Right Arm)   Pulse 64   Temp 98.7 F (37.1 C) (Oral)   Resp 16   Ht 5\' 2"  (1.575 m)   Wt 79.6 kg (175 lb 6.4 oz)   SpO2 95%   BMI 32.08 kg/m  Vital signs in last 24 hours: Temp:  [97.8 F (36.6 C)-98.7 F (37.1 C)] 98.7 F (37.1 C) (06/25 0745) Pulse Rate:  [64-67] 64 (06/25 0745) Resp:  [16] 16 (06/25 0745) BP: (129-135)/(71-73) 135/71 (06/25 0745) SpO2:  [94 %-95 %] 95 % (06/25 0745)  Intake/Output from previous day: 06/24 0701 - 06/25 0700 In: 240 [P.O.:240] Out: -  Intake/Output this shift: Total I/O In: 50 [P.O.:50] Out: -  Nutritional status:  Diet Order           Diet Heart Room service appropriate? Yes; Fluid consistency: Thin  Diet effective now          Neurologic Exam: Mental Status: Alert, oriented, thought content appropriate.  Speech fluent without evidence of aphasia.  Able to follow 3 step commands without difficulty. Cranial Nerves: II: Discs flat bilaterally; Visual fields grossly normal, pupils equal, round, reactive to light and accommodation III,IV, VI: ptosis not present, extra-ocular motions intact bilaterally V,VII: smile symmetric, facial light touch sensation normal bilaterally VIII: hearing normal bilaterally IX,X: gag reflex present XI: bilateral shoulder shrug XII: midline tongue extension Motor: Patient able to lift all extremities against gravity Sensory: Pinprick and light touch intact   Lab Results: Basic Metabolic Panel: Recent Labs  Lab 05/20/18 2100 05/21/18 0401  NA 129* 130*  K 3.9 3.3*  CL 95* 96*  CO2 23 22  GLUCOSE 159* 118*  BUN 13 15  CREATININE 1.03* 0.93  CALCIUM 9.0 8.5*    Liver Function Tests: Recent Labs  Lab 05/20/18 2100  AST 41  ALT <5*  ALKPHOS 76  BILITOT 0.9  PROT 7.0  ALBUMIN 3.9   No results for input(s): LIPASE, AMYLASE in the last 168 hours. No results for  input(s): AMMONIA in the last 168 hours.  CBC: Recent Labs  Lab 05/20/18 2100 05/21/18 0401  WBC 8.3 6.8  HGB 14.0 13.5  HCT 39.6 39.3  MCV 92.2 91.6  PLT 293 273    Cardiac Enzymes: Recent Labs  Lab 05/20/18 2100 05/21/18 0401  CKTOTAL 192 168  TROPONINI <0.03  --     Lipid Panel: Recent Labs  Lab 05/21/18 0401  CHOL 167  TRIG 145  HDL 37*  CHOLHDL 4.5  VLDL 29  LDLCALC 101*    CBG: Recent Labs  Lab 05/22/18 0754  GLUCAP 110*    Microbiology: Results for orders placed or performed during the hospital encounter of 09/28/12  Surgical pcr screen     Status: None   Collection Time: 09/30/12 11:53 PM  Result Value Ref Range Status   MRSA, PCR NEGATIVE NEGATIVE Final   Staphylococcus aureus NEGATIVE NEGATIVE Final    Comment:        The Xpert SA Assay (FDA approved for NASAL specimens in patients over 36 years of age), is one component of a comprehensive surveillance program.  Test performance has been validated by [employer] for patients greater than or equal to 3 year old. It is not intended to diagnose infection nor to guide or monitor treatment.    Coagulation Studies: No results for input(s): LABPROT, INR in the last 72 hours.  Imaging:  Dg Chest 2 View  Result Date: 05/21/2018 CLINICAL DATA:  Hyponatremia, hypertension, coronary disease, altered mental status EXAM: CHEST - 2 VIEW COMPARISON:  10/31/2012 FINDINGS: Remote coronary bypass changes. Atherosclerosis of aorta. Stable heart size and vascularity. Negative for focal pneumonia, collapse or consolidation. Negative for edema, effusion or pneumothorax. Trachea is midline. IMPRESSION: Stable chest exam without interval change or acute process. Remote coronary bypass Thoracic aortic atherosclerosis Electronically Signed   By: Jerilynn Mages.  Shick M.D.   On: 05/21/2018 08:27   Ct Head Wo Contrast  Result Date: 05/20/2018 CLINICAL DATA:  Altered level of consciousness. EXAM: CT HEAD WITHOUT CONTRAST  TECHNIQUE: Contiguous axial images were obtained from the base of the skull through the vertex without intravenous contrast. COMPARISON:  None. FINDINGS: Brain: Moderate chronic ischemic white matter disease is noted. No mass effect or midline shift is noted. Ventricular size is within normal limits. There is no evidence of mass lesion, hemorrhage or acute infarction. Vascular: No hyperdense vessel or unexpected calcification. Skull: Normal. Negative for fracture or focal lesion. Sinuses/Orbits: No acute finding. Other: None. IMPRESSION: Moderate chronic ischemic white matter disease. No acute intracranial abnormality seen. Electronically Signed   By: Marijo Conception, M.D.   On: 05/20/2018 21:48   Mr Brain Wo Contrast  Result Date: 05/21/2018 CLINICAL DATA:  Altered level of consciousness. EXAM: MRI HEAD WITHOUT CONTRAST TECHNIQUE: Multiplanar, multiecho pulse sequences of the brain and surrounding structures were obtained without intravenous contrast. COMPARISON:  CT 05/20/2018.  MRI head 03/11/2015 FINDINGS: Brain: Mild atrophy without hydrocephalus. Negative for acute infarct. Moderate chronic microvascular ischemic changes throughout the white matter. Mild chronic ischemia in the pons and cerebellum. Scattered foci of chronic microhemorrhage are present including the cerebellum bilaterally, thalamus bilaterally. This appears to have progressed since 2016. No mass edema or midline shift. Vascular: Normal arterial flow voids Skull and upper cervical spine: Negative Sinuses/Orbits: Mild mucosal edema paranasal sinuses. Bilateral cataract removal Other: None IMPRESSION: Negative for acute infarct. Atrophy and moderate chronic microvascular ischemia. Progression of multiple foci of chronic microhemorrhage in the brain. This is likely related to the patient's history of hypertension. Electronically Signed   By: Franchot Gallo M.D.   On: 05/21/2018 12:42   US Carotid Bilateral  Result Date: 05/21/2018 CLINICAL  DATA:  84 year old female with symptoms of transient ischemic attack EXAM: BILATERAL CAROTID DUPLEX ULTRASOUND TECHNIQUE: Pearline Cables scale imaging, color Doppler and duplex ultrasound were performed of bilateral carotid and vertebral arteries in the neck. COMPARISON:  None. FINDINGS: Criteria: Quantification of carotid stenosis is based on velocity parameters that correlate the residual internal carotid diameter with NASCET-based stenosis levels, using the diameter of the distal internal carotid lumen as the denominator for stenosis measurement. The following velocity measurements were obtained: RIGHT ICA: 60/7 cm/sec CCA: 62/9 cm/sec SYSTOLIC ICA/CCA RATIO:  0.8 ECA:  137 cm/sec LEFT ICA: 61/8 cm/sec CCA: 52/8 cm/sec SYSTOLIC ICA/CCA RATIO:  0.8 ECA:  97 cm/sec RIGHT CAROTID ARTERY: Mild heterogeneous atherosclerotic plaque in the carotid bifurcation extending into the proximal internal carotid artery. By peak systolic velocity criteria, the estimated stenosis remains less than 50%. RIGHT VERTEBRAL ARTERY:  Patent with normal antegrade flow. LEFT CAROTID ARTERY: Trace heterogeneous atherosclerotic plaque in the carotid bifurcation extending into the proximal internal carotid artery. By peak systolic velocity criteria, the estimated stenosis remains less than 50%. LEFT VERTEBRAL ARTERY:  Patent with antegrade flow. IMPRESSION: 1. Mild (1-49%) stenosis proximal right internal carotid artery secondary to trace heterogeneous atherosclerotic plaque. 2. Mild (1-49%) stenosis proximal  left internal carotid artery secondary to trace heterogeneous atherosclerotic plaque. 3. Vertebral arteries are patent with antegrade flow. Signed, Criselda Peaches, MD [facility] Radiology Electronically Signed   By: Jacqulynn Cadet M.D.   On: 05/21/2018 11:18    Medications:  I have reviewed the patient's current medications. Scheduled: . aspirin EC  81 mg Oral Daily  . atorvastatin   40 mg Oral q1800  . benazepril  20 mg Oral Daily  . carbidopa-levodopa  1 tablet Oral TID  . cholecalciferol  1,000 Units Oral Daily  . clopidogrel  75 mg Oral Daily  . [START ON 06/11/2018] cyanocobalamin   Intramuscular Q30 days  . docusate sodium  100 mg Oral BID    Assessment/Plan: No new neurological complaints. Carotid dopplers show no evidence of hemodynamically significant stenosis.  Echocardiogram pending.  Last sodium 130.    Recommendations: 1. Continue ASA and Plavix 2. If echocardiogram unremarkable to follow up with neurology on an outpatient basis.  Patient is seeing Dr. Melrose Nakayama of [facility]    LOS: 1 day   Alexis Goodell, MD Neurology [phone] 05/22/2018  10:43 AM

## 2018-05-22 NOTE — Clinical Social Work Note (Signed)
CSW received consult for questions about Assisted Living. CSW met with patient's daughters at bedside. Family had appropriate questions about assisted living. CSW provided list of Assisted Living facilities in the area. CSW also provided emotional support and medicaid information. CSW also referred daughters to speak with DSS regarding Medicaid. CSW signing off. Please re consult for any further CSW needs.   [place], [place]

## 2018-05-22 NOTE — Progress Notes (Addendum)
Patient ID: Kimberly Scott, female   DOB: 12-30-1927, 84 y.o.   MRN: [id number]  [facility] PROGRESS NOTE  Kimberly Scott DOB: 09-Jul-1928 DOA: 05/20/2018 PCP: Adin Hector, MD  HPI/Subjective: Family very concerned about patient going home today.  Husband has dementia and he had a very difficult night.  Family concerned about living situation for both patient and her husband.  Patient feels well and wants to go home.  Offers no complaints.  Objective: Vitals:   05/22/18 0745 05/22/18 1316  BP: 135/71 (!) 147/82  Pulse: 64 64  Resp: 16 16  Temp: 98.7 F (37.1 C) 97.6 F (36.4 C)  SpO2: 95% 98%    Filed Weights   05/20/18 2059 05/21/18 0107  Weight: 78.5 kg (173 lb) 79.6 kg (175 lb 6.4 oz)    ROS: Review of Systems  Constitutional: Negative for chills and fever.  Eyes: Negative for blurred vision.  Respiratory: Negative for cough and shortness of breath.   Cardiovascular: Negative for chest pain.  Gastrointestinal: Negative for abdominal pain, constipation, diarrhea, nausea and vomiting.  Genitourinary: Negative for dysuria.  Musculoskeletal: Negative for joint pain.  Neurological: Negative for dizziness and headaches.   Exam: Physical Exam  Constitutional: She is oriented to person, place, and time.  HENT:  Nose: No mucosal edema.  Mouth/Throat: No oropharyngeal exudate or posterior oropharyngeal edema.  Eyes: Pupils are equal, round, and reactive to light. Conjunctivae, EOM and lids are normal.  Neck: No JVD present. Carotid bruit is not present. No edema present. No thyroid mass and no thyromegaly present.  Cardiovascular: S1 normal and S2 normal. Exam reveals no gallop.  No murmur heard. Pulses:      Dorsalis pedis pulses are 2+ on the right side, and 2+ on the left side.  Respiratory: No respiratory distress. She has no wheezes. She has no rhonchi. She has no rales.  GI: Soft. Bowel sounds are normal. There is no tenderness.   Musculoskeletal:       Right ankle: She exhibits swelling.       Left ankle: She exhibits swelling.  Lymphadenopathy:    She has no cervical adenopathy.  Neurological: She is alert and oriented to person, place, and time. No cranial nerve deficit.  Skin: Skin is warm. No rash noted. Nails show no clubbing.  Psychiatric: She has a normal mood and affect.      Data Reviewed: Basic Metabolic Panel: Recent Labs  Lab 05/20/18 2100 05/21/18 0401 05/22/18 0326  NA 129* 130* 126*  K 3.9 3.3* 4.1  CL 95* 96* 94*  CO2 23 22 23   GLUCOSE 159* 118* 113*  BUN 13 15 14   CREATININE 1.03* 0.93 0.90  CALCIUM 9.0 8.5* 8.7*   Liver Function Tests: Recent Labs  Lab 05/20/18 2100  AST 41  ALT <5*  ALKPHOS 76  BILITOT 0.9  PROT 7.0  ALBUMIN 3.9   CBC: Recent Labs  Lab 05/20/18 2100 05/21/18 0401  WBC 8.3 6.8  HGB 14.0 13.5  HCT 39.6 39.3  MCV 92.2 91.6  PLT 293 273   Cardiac Enzymes: Recent Labs  Lab 05/20/18 2100 05/21/18 0401  CKTOTAL 192 168  TROPONINI <0.03  --       Studies: Dg Chest 2 View  Result Date: 05/21/2018 CLINICAL DATA:  Hyponatremia, hypertension, coronary disease, altered mental status EXAM: CHEST - 2 VIEW COMPARISON:  10/31/2012 FINDINGS: Remote coronary bypass changes. Atherosclerosis of aorta. Stable heart size and vascularity. Negative for focal pneumonia,  collapse or consolidation. Negative for edema, effusion or pneumothorax. Trachea is midline. IMPRESSION: Stable chest exam without interval change or acute process. Remote coronary bypass Thoracic aortic atherosclerosis Electronically Signed   By: Jerilynn Mages.  Shick M.D.   On: 05/21/2018 08:27   Ct Head Wo Contrast  Result Date: 05/20/2018 CLINICAL DATA:  Altered level of consciousness. EXAM: CT HEAD WITHOUT CONTRAST TECHNIQUE: Contiguous axial images were obtained from the base of the skull through the vertex without intravenous contrast. COMPARISON:  None. FINDINGS: Brain: Moderate chronic ischemic white  matter disease is noted. No mass effect or midline shift is noted. Ventricular size is within normal limits. There is no evidence of mass lesion, hemorrhage or acute infarction. Vascular: No hyperdense vessel or unexpected calcification. Skull: Normal. Negative for fracture or focal lesion. Sinuses/Orbits: No acute finding. Other: None. IMPRESSION: Moderate chronic ischemic white matter disease. No acute intracranial abnormality seen. Electronically Signed   By: Marijo Conception, M.D.   On: 05/20/2018 21:48   Mr Brain Wo Contrast  Result Date: 05/21/2018 CLINICAL DATA:  Altered level of consciousness. EXAM: MRI HEAD WITHOUT CONTRAST TECHNIQUE: Multiplanar, multiecho pulse sequences of the brain and surrounding structures were obtained without intravenous contrast. COMPARISON:  CT 05/20/2018.  MRI head 03/11/2015 FINDINGS: Brain: Mild atrophy without hydrocephalus. Negative for acute infarct. Moderate chronic microvascular ischemic changes throughout the white matter. Mild chronic ischemia in the pons and cerebellum. Scattered foci of chronic microhemorrhage are present including the cerebellum bilaterally, thalamus bilaterally. This appears to have progressed since 2016. No mass edema or midline shift. Vascular: Normal arterial flow voids Skull and upper cervical spine: Negative Sinuses/Orbits: Mild mucosal edema paranasal sinuses. Bilateral cataract removal Other: None IMPRESSION: Negative for acute infarct. Atrophy and moderate chronic microvascular ischemia. Progression of multiple foci of chronic microhemorrhage in the brain. This is likely related to the patient's history of hypertension. Electronically Signed   By: Franchot Gallo M.D.   On: 05/21/2018 12:42   US Carotid Bilateral  Result Date: 05/21/2018 CLINICAL DATA:  84 year old female with symptoms of transient ischemic attack EXAM: BILATERAL CAROTID DUPLEX ULTRASOUND TECHNIQUE: Pearline Cables scale imaging, color Doppler and duplex ultrasound were performed  of bilateral carotid and vertebral arteries in the neck. COMPARISON:  None. FINDINGS: Criteria: Quantification of carotid stenosis is based on velocity parameters that correlate the residual internal carotid diameter with NASCET-based stenosis levels, using the diameter of the distal internal carotid lumen as the denominator for stenosis measurement. The following velocity measurements were obtained: RIGHT ICA: 60/7 cm/sec CCA: 85/0 cm/sec SYSTOLIC ICA/CCA RATIO:  0.8 ECA:  137 cm/sec LEFT ICA: 61/8 cm/sec CCA: 27/7 cm/sec SYSTOLIC ICA/CCA RATIO:  0.8 ECA:  97 cm/sec RIGHT CAROTID ARTERY: Mild heterogeneous atherosclerotic plaque in the carotid bifurcation extending into the proximal internal carotid artery. By peak systolic velocity criteria, the estimated stenosis remains less than 50%. RIGHT VERTEBRAL ARTERY:  Patent with normal antegrade flow. LEFT CAROTID ARTERY: Trace heterogeneous atherosclerotic plaque in the carotid bifurcation extending into the proximal internal carotid artery. By peak systolic velocity criteria, the estimated stenosis remains less than 50%. LEFT VERTEBRAL ARTERY:  Patent with antegrade flow. IMPRESSION: 1. Mild (1-49%) stenosis proximal right internal carotid artery secondary to trace heterogeneous atherosclerotic plaque. 2. Mild (1-49%) stenosis proximal left internal carotid artery secondary to trace heterogeneous atherosclerotic plaque. 3. Vertebral arteries are patent with antegrade flow. Signed, Criselda Peaches, MD [facility] Radiology Electronically Signed   By: Jacqulynn Cadet M.D.   On: 05/21/2018  11:18    Scheduled Meds: . aspirin EC  81 mg Oral Daily  . atorvastatin  40 mg Oral q1800  . benazepril  20 mg Oral Daily  . carbidopa-levodopa  1 tablet Oral TID  . cholecalciferol  1,000 Units Oral Daily  . clopidogrel  75 mg Oral Daily  . [START ON 06/11/2018] cyanocobalamin   Intramuscular Q30 days  . docusate sodium   100 mg Oral BID   Continuous Infusions:  Assessment/Plan:  1. Acute encephalopathy and speech abnormality.  MRI of the brain was negative for acute stroke but did show chronic microvascular ischemia.  Progression of multiple foci of chronic microhemorrhage in the brain.  Neurology recommended aspirin and Plavix.  Statin added.  I walked the patient around personally today and she did well with the walker a lot of verbal cues to stay close to the walker. 2. Hyponatremia.  Sodium resulted later in the day is down at 126.  Recheck again tomorrow morning 3. Essential hypertension on benazepril 4. Hyperlipidemia unspecified.  LDL 101.  Goal less than 70.  Continue atorvastatin. 5. Fall.  CPK added on and is 168.  Physical therapy evaluation. 6. Diet controlled diabetes with a hemoglobin A1c of 7.1.  Will hold off on medications at this point.  Code Status:     Code Status Orders  (From admission, onward)        Start     Ordered   05/21/18 0107  Full code  Continuous     05/21/18 0106    Code Status History    Date Active Date Inactive Code Status Order ID Comments User Context   10/03/2012 0856 10/07/2012 1430 Full Code [id number]  Grace Isaac, MD Inpatient   10/01/2012 1347 10/03/2012 0856 Full Code [id number]  Laqueta Carina, RN Inpatient   09/28/2012 1518 10/01/2012 1347 Full Code [id number]  John Giovanni, PA Inpatient    Advance Directive Documentation     Most Recent Value  Type of Advance Directive  Living will, Healthcare Power of Attorney  Pre-existing out of facility DNR order (yellow form or pink MOST form)  -  "MOST" Form in Place?  -     Family Communication: Spoke with family at the bedside numerous times today Disposition Plan: Likely home with home health tomorrow.  Family also interested in assisted living for both her and her husband.  Consultants:  Neurology  Time spent: 35 minutes  [place]

## 2018-05-22 NOTE — Evaluation (Signed)
Occupational Therapy Evaluation Patient Details Name: Kimberly Scott MRN: [id number] DOB: Nov 21, 1928 Today's Date: 05/22/2018    History of Present Illness Patient is an 84 year old female admitted for TIA s/p fall and c/o facial droop and AMS.  PMH includes PD, Htn, CA and arthitis.   Clinical Impression   Pt seen for OT evaluation this date. Prior to hospital admission, pt was independent with ADL, using SPC primarily, and primary caregiver for her spouse who has advanced dementia. Per family report and chart review, pt has had cognitive decline over past 6 months or so and has had multiple falls (pt denies). Pt lives in 1 story home with 3 steps to enter. Hallways too small for rollator. Pt pleasant and agreeable to OT upon entry. Son in law present throughout session. No sensory, focal weakness, or coordination deficits appreciated. Pt with at least 4-/5 strength bilaterally UE/LE. Mild resting tremor noted (has Parkinson's). Pt able to follow simple commands well, mild difficulty with higher level questions and commands. Demonstrates little insight/awareness of her cognitive deficits in STM, safety awareness, and problem solving. Pt able to mask deficits to an extent, providing generalities in response to specific safety scenarios and laughing and stating, "well, I knew that!" in response to correct responses. For example, unable to correctly identify 911 to call for help, but states she would call the police and attempts to give the sheriff's phone number. Son in law expresses significant concern regarding pt's safety and ability to continue to care for pt's spouse at home safely. Son in law notes pt has had difficulty remembering to take medications as prescribed, which the pt denies. Pt reports independent with med mgt and set up of pills using a weekly pill organizer.   Pt/son in law provided with extensive education on medication mgt, safety, importance of adherence, encouraged review of  meds with physicians regularly, and compensatory strategies to improve adherence and safety overall. Pt will need additional training/education to support recall and implementation. Pt instructed in functional mobility training with RW to improve safety and independence with transfers, requiring verbal cues for hand/foot placement during session. Pt would benefit from skilled OT to address noted impairments and functional limitations (see below for any additional details) in order to maximize safety and independence while minimizing falls risk and caregiver burden.  Upon hospital discharge, recommend pt discharge home with 24/7 supervision/assist and [facility] services to address safety, med mgt, compensatory strategies for cognitive deficits, and family/caregiver education/training.     Follow Up Recommendations  Home health OT;Supervision/Assistance - 24 hour    Equipment Recommendations  Other (comment)(TBD)    Recommendations for Other Services       Precautions / Restrictions Precautions Precautions: Fall Precaution Comments: HFR, recent fall Restrictions Weight Bearing Restrictions: No      Mobility Bed Mobility Overal bed mobility: Needs Assistance Bed Mobility: Supine to Sit     Supine to sit: Supervision     General bed mobility comments: additional time/effort to perform  Transfers Overall transfer level: Needs assistance Equipment used: Rolling walker (2 wheeled) Transfers: Sit to/from Stand Sit to Stand: Min guard         General transfer comment: VC for hand/foot placement to max safety with RW use    Balance Overall balance assessment: Needs assistance Sitting-balance support: Bilateral upper extremity supported;Feet supported Sitting balance-Leahy Scale: Good     Standing balance support: Bilateral upper extremity supported Standing balance-Leahy Scale: Fair  ADL either performed or assessed with clinical judgement    ADL Overall ADL's : Needs assistance/impaired Eating/Feeding: Sitting;Independent   Grooming: Standing;Supervision/safety   Upper Body Bathing: Sitting;Supervision/ safety   Lower Body Bathing: Sit to/from stand;Supervison/ safety;Cueing for safety   Upper Body Dressing : Sitting;Supervision/safety   Lower Body Dressing: Sit to/from stand;Cueing for safety;Supervision/safety   Toilet Transfer: RW;Ambulation;Comfort height toilet;Cueing for safety;Cueing for sequencing Toilet Transfer Details (indicate cue type and reason): VC for RW safety         Functional mobility during ADLs: Min guard;Rolling walker       Vision Patient Visual Report: No change from baseline       Perception     Praxis      Pertinent Vitals/Pain Pain Assessment: No/denies pain     Hand Dominance Right   Extremity/Trunk Assessment Upper Extremity Assessment Upper Extremity Assessment: Overall WFL for tasks assessed(Grossly 4-/5 bilaterally, sensation and coordination intact)   Lower Extremity Assessment Lower Extremity Assessment: Overall WFL for tasks assessed(Grossly 4/5 bilaterally, sensation and coordination intact)   Cervical / Trunk Assessment Cervical / Trunk Assessment: Normal   Communication Communication Communication: HOH   Cognition Arousal/Alertness: Awake/alert Behavior During Therapy: WFL for tasks assessed/performed Overall Cognitive Status: History of cognitive impairments - at baseline                                 General Comments: Some difficulty with higher level directions/commands, impaired STM, very little insight into cognitive deficits, impaired safety awareness and judgment   General Comments       Exercises Other Exercises Other Exercises: Pt/son in law provided with extensive education on medication mgt, safety, importance of adherence, encouraged review of meds with physicians regularly, and compensatory strategies to improve adherence  and safety overall. Pt will need additional training/education to support recall and implementation.   Shoulder Instructions      Home Living Family/patient expects to be discharged to:: Private residence Living Arrangements: Spouse/significant other(pt is caregiver for spouse who has advanced dementia) Available Help at Discharge: Family;Available 24 hours/day(daughter) Type of Home: House Home Access: Stairs to enter [employer] of Steps: 3 Entrance Stairs-Rails: Right Home Layout: One level     Bathroom Shower/Tub: Teacher, early years/pre: Standard Bathroom Accessibility: No   Home Equipment: Environmental consultant - 2 wheels;Cane - single point;Walker - 4 wheels;Grab bars - tub/shower;Shower seat   Additional Comments: rollator      Prior Functioning/Environment Level of Independence: Independent with assistive device(s)        Comments: Uses SPC, has a RW but has not been using. Per son in law, rollator won't fit in the house, 2WW will but pt uses SPC. Pt has had several falls in past 12 months (pt denies, son in law confirms). Pt reports independent with med mgt but son in law confirms she often forgets        OT Problem List: Decreased strength;Decreased knowledge of use of DME or AE;Decreased cognition;Decreased activity tolerance;Impaired balance (sitting and/or standing);Decreased safety awareness      OT Treatment/Interventions: Self-care/ADL training;Balance training;Therapeutic activities;Cognitive remediation/compensation;DME and/or AE instruction;Patient/family education    OT Goals(Current goals can be found in the care plan section) Acute Rehab OT Goals Patient Stated Goal: to go home and care for my spouse OT Goal Formulation: With patient/family Time For Goal Achievement: 06/05/18 Potential to Achieve Goals: Good ADL Goals Pt Will Transfer to Toilet: with supervision;ambulating(elevated  commode, LRAD, no verbal cues for safety) Additional ADL  Goal #1: Pt will implement learned safety and compensatory strategies for medication mgt to maximize adherence and safety with minimal verbal cues.  OT Frequency: Min 2X/week   Barriers to D/C:            Co-evaluation              AM-PAC PT "6 Clicks" Daily Activity     Outcome Measure Help from another person eating meals?: None Help from another person taking care of personal grooming?: None Help from another person toileting, which includes using toliet, bedpan, or urinal?: A Little Help from another person bathing (including washing, rinsing, drying)?: A Little Help from another person to put on and taking off regular upper body clothing?: None Help from another person to put on and taking off regular lower body clothing?: A Little 6 Click Score: 21   End of Session Equipment Utilized During Treatment: Gait belt;Rolling walker  Activity Tolerance: Patient tolerated treatment well Patient left: in chair;with call bell/phone within reach;with chair alarm set;with family/visitor present  OT Visit Diagnosis: Other abnormalities of gait and mobility (R26.89);Repeated falls (R29.6);Other symptoms and signs involving cognitive function                Time: 3354-5625 OT Time Calculation (min): 50 min Charges:  OT General Charges $OT Visit: 1 Visit OT Treatments $Self Care/Home Management : 38-52 mins  Jeni Salles, MPH, MS, OTR/L ascom [phone] 05/22/18, 10:15 AM

## 2018-05-23 DIAGNOSIS — G459 Transient cerebral ischemic attack, unspecified: Secondary | ICD-10-CM | POA: Diagnosis not present

## 2018-05-23 LAB — BASIC METABOLIC PANEL
Anion gap: 9 (ref 5–15)
BUN: 13 mg/dL (ref 8–23)
CO2: 22 mmol/L (ref 22–32)
CREATININE: 0.74 mg/dL (ref 0.44–1.00)
Calcium: 8.7 mg/dL — ABNORMAL LOW (ref 8.9–10.3)
Chloride: 95 mmol/L — ABNORMAL LOW (ref 98–111)
GFR calc Af Amer: >60 mL/min (ref >60)
GFR calc non Af Amer: >60 mL/min (ref >60)
GLUCOSE: 120 mg/dL — AB (ref 70–99)
Potassium: 4.1 mmol/L (ref 3.5–5.1)
SODIUM: 126 mmol/L — AB (ref 135–145)

## 2018-05-23 LAB — GLUCOSE, CAPILLARY: GLUCOSE-CAPILLARY: 115 mg/dL — AB (ref 70–99)

## 2018-05-23 MED ORDER — CLOPIDOGREL BISULFATE 75 MG PO TABS: 75.0000 mg | ORAL_TABLET | Freq: Every day | ORAL | 0 refills | Status: DC

## 2018-05-23 MED ORDER — ENSURE ENLIVE PO LIQD: 237.0000 mL | Freq: Two times a day (BID) | ORAL | 0 refills | Status: DC

## 2018-05-23 MED ORDER — ENSURE ENLIVE PO LIQD: 237.0000 mL | Freq: Two times a day (BID) | ORAL | Status: DC

## 2018-05-23 MED ORDER — ATORVASTATIN CALCIUM 40 MG PO TABS: 40.0000 mg | ORAL_TABLET | Freq: Every day | ORAL | 0 refills | Status: AC

## 2018-05-23 NOTE — Discharge Summary (Signed)
[facility] NAME: Kimberly Scott    MR#:  [id number]  DATE OF BIRTH:  [birth year]  DATE OF ADMISSION:  05/20/2018 ADMITTING PHYSICIAN: Amelia Jo, MD  DATE OF DISCHARGE: 05/23/2018 11:51 AM  PRIMARY CARE PHYSICIAN: Adin Hector, MD    ADMISSION DIAGNOSIS:  TIA (transient ischemic attack) [G45.9] Hyponatremia [E87.1] Encephalopathy acute [G93.40]  DISCHARGE DIAGNOSIS:  Active Problems:   Acute encephalopathy   Hyponatremia   TIA (transient ischemic attack)   SECONDARY DIAGNOSIS:   Past Medical History:  Diagnosis Date  . Anginal pain ([place])   . Arthritis   . Cancer ([facility])    FACIAL SKIN CANCER  . Coronary artery disease   . Headache(784.0)   . Heart murmur   . Hypertension   . Pneumonia    hx of PNA    HOSPITAL COURSE:   1.  Acute encephalopathy and speech abnormality.  MRI of the brain was negative for acute stroke but did show chronic microvascular ischemia and multiple foci of chronic microhemorrhage in the brain.  Likely TIA related.  Neurology recommended aspirin and Plavix.  I added atorvastatin.  I personally walked the patient around the nursing station myself.  Moderate verbal cues to stay close to the walker.  Physical therapy recommended home with home health. 2.  Hyponatremia.  I believe this is secondary to hydrochlorothiazide and poor diet at home.  Patient eats a lot of cakes and donuts at home.  Advised balanced diet.  Started Ensure.  Case discussed with Dr. Rolly Salter nephrology and he was okay with discharge home and follow-up even though the sodium is a little low at 126.  Patient mentating fine at this point. 3.  Essential hypertension on benazepril 4.  Hyperlipidemia unspecified.  LDL 101.  Goal less than 70.  Continue atorvastatin 5.  Fall.  CPK normal range.  Physical therapy recommended home with home health. 6.  Type 2 diabetes.  Hemoglobin A1c 7.1.  Will hold off on medications at this point  since the patient does have a poor diet with eating donuts and cake at home.  DISCHARGE CONDITIONS:   Satisfactory  CONSULTS OBTAINED:  Treatment Team:  Leotis Pain, MD  DRUG ALLERGIES:   Allergies  Allergen Reactions  . Omega 3  [Fish Oil] Other (See Comments)  . Neosporin [Neomycin-Bacitracin Zn-Polymyx] Rash  . Tape Rash    Blisters    DISCHARGE MEDICATIONS:   Allergies as of 05/23/2018      Reactions   Omega 3  [fish Oil] Other (See Comments)   Neosporin [neomycin-bacitracin Zn-polymyx] Rash   Tape Rash   Blisters      Medication List    STOP taking these medications   furosemide 40 MG tablet Commonly known as:  LASIX   gabapentin 100 MG capsule Commonly known as:  NEURONTIN   hydrochlorothiazide 25 MG tablet Commonly known as:  HYDRODIURIL   meloxicam 7.5 MG tablet Commonly known as:  MOBIC     TAKE these medications   ascorbic acid 500 MG tablet Commonly known as:  VITAMIN C Take 1,000 mg by mouth daily.   aspirin EC 81 MG tablet Take 81 mg by mouth daily.   atorvastatin 40 MG tablet Commonly known as:  LIPITOR Take 1 tablet (40 mg total) by mouth daily at 6 PM.   B-12 IJ Inject 1 Dose as directed every 30 (thirty) days.   benazepril 20 MG tablet Commonly known as:  LOTENSIN  Take 20 mg by mouth daily.   calcium carbonate 500 MG chewable tablet Commonly known as:  TUMS - dosed in mg elemental calcium Chew 1 tablet by mouth daily as needed for heartburn.   carbidopa-levodopa 25-100 MG tablet Commonly known as:  SINEMET IR Take 1 tablet by mouth 3 (three) times daily.   cholecalciferol 1000 units tablet Commonly known as:  VITAMIN D Take 1,000 Units by mouth daily.   clopidogrel 75 MG tablet Commonly known as:  PLAVIX Take 1 tablet (75 mg total) by mouth daily.   feeding supplement (ENSURE ENLIVE) Liqd Take 237 mLs by mouth 2 (two) times daily between meals.   triamcinolone ointment 0.1 % Commonly known as:  KENALOG Apply 1  application topically 2 (two) times daily as needed.        DISCHARGE INSTRUCTIONS:   Follow-up PMD 6 days Nephrology follow-up 2 weeks  If you experience worsening of your admission symptoms, develop shortness of breath, life threatening emergency, suicidal or homicidal thoughts you must seek medical attention immediately by calling 911 or calling your MD immediately  if symptoms less severe.  You Must read complete instructions/literature along with all the possible adverse reactions/side effects for all the Medicines you take and that have been prescribed to you. Take any new Medicines after you have completely understood and accept all the possible adverse reactions/side effects.   Please note  You were cared for by a hospitalist during your hospital stay. If you have any questions about your discharge medications or the care you received while you were in the hospital after you are discharged, you can call the unit and asked to speak with the hospitalist on call if the hospitalist that took care of you is not available. Once you are discharged, your primary care physician will handle any further medical issues. Please note that NO REFILLS for any discharge medications will be authorized once you are discharged, as it is imperative that you return to your primary care physician (or establish a relationship with a primary care physician if you do not have one) for your aftercare needs so that they can reassess your need for medications and monitor your lab values.    Today   CHIEF COMPLAINT:   Chief Complaint  Patient presents with  . Altered Mental Status    HISTORY OF PRESENT ILLNESS:  Kimberly Scott  is a 84 y.o. female presented with altered mental status   VITAL SIGNS:  Blood pressure 124/71, pulse 62, temperature 97.6 F (36.4 C), temperature source Oral, resp. rate 18, height 5\' 2"  (1.575 m), weight 84.3 kg (185 lb 12.8 oz), SpO2 98 %.    PHYSICAL EXAMINATION:   GENERAL:  84 y.o.-year-old patient lying in the bed with no acute distress.  EYES: Pupils equal, round, reactive to light and accommodation. No scleral icterus. Extraocular muscles intact.  HEENT: Head atraumatic, normocephalic. Oropharynx and nasopharynx clear.  NECK:  Supple, no jugular venous distention. No thyroid enlargement, no tenderness.  LUNGS: Normal breath sounds bilaterally, no wheezing, rales,rhonchi or crepitation. No use of accessory muscles of respiration.  CARDIOVASCULAR: S1, S2 normal. No murmurs, rubs, or gallops.  ABDOMEN: Soft, non-tender, non-distended. Bowel sounds present. No organomegaly or mass.  EXTREMITIES:  trace  pedal edema, No cyanosis, or clubbing.  NEUROLOGIC: Cranial nerves II through XII are intact. Muscle strength 5/5 in all extremities. Sensation intact. Gait not checked.  PSYCHIATRIC: The patient is alert and  Answers questions appropriately SKIN: No obvious rash, lesion, or  ulcer.   DATA REVIEW:   CBC Recent Labs  Lab 05/21/18 0401  WBC 6.8  HGB 13.5  HCT 39.3  PLT 273    Chemistries  Recent Labs  Lab 05/20/18 2100  05/23/18 0629  NA 129*   < > 126*  K 3.9   < > 4.1  CL 95*   < > 95*  CO2 23   < > 22  GLUCOSE 159*   < > 120*  BUN 13   < > 13  CREATININE 1.03*   < > 0.74  CALCIUM 9.0   < > 8.7*  AST 41  --   --   ALT <5*  --   --   ALKPHOS 76  --   --   BILITOT 0.9  --   --    < > = values in this interval not displayed.    Cardiac Enzymes Recent Labs  Lab 05/20/18 2100  TROPONINI <0.03    Microbiology Results  Results for orders placed or performed during the hospital encounter of 09/28/12  Surgical pcr screen     Status: None   Collection Time: 09/30/12 11:53 PM  Result Value Ref Range Status   MRSA, PCR NEGATIVE NEGATIVE Final   Staphylococcus aureus NEGATIVE NEGATIVE Final    Comment:        The Xpert SA Assay (FDA approved for NASAL specimens in patients over 77 years of age), is one component of a  comprehensive surveillance program.  Test performance has been validated by [employer] for patients greater than or equal to 56 year old. It is not intended to diagnose infection nor to guide or monitor treatment.      Management plans discussed with the patient, family and they are in agreement.  CODE STATUS:  Code Status History    Date Active Date Inactive Code Status Order ID Comments User Context   05/21/2018 0106 05/23/2018 1456 Full Code [id number]  Amelia Jo, MD Inpatient   10/03/2012 0856 10/07/2012 1430 Full Code [id number]  Grace Isaac, MD Inpatient   10/01/2012 1347 10/03/2012 0856 Full Code [id number]  Laqueta Carina, RN Inpatient   09/28/2012 1518 10/01/2012 1347 Full Code [id number]  John Giovanni, PA Inpatient    Advance Directive Documentation     Most Recent Value  Type of Advance Directive  Living will, Healthcare Power of Attorney  Pre-existing out of facility DNR order (yellow form or pink MOST form)  -  "MOST" Form in Place?  -      TOTAL TIME TAKING CARE OF THIS PATIENT: 35 minutes.    Loletha Grayer M.D on 05/23/2018 at 3:20 PM  Between 7am to 6pm - Pager - [phone]  After 6pm go to [email or web address] - password [email or web address] [facility]  [phone]  CC: Primary care physician; Adin Hector, MD

## 2018-05-23 NOTE — Care Management (Signed)
Discharge to home today per Dr. Leslye Peer. [facility] for services in the home. Floydene Flock, [facility] representative updated. Family will transport Shelbie Ammons RN MSN CCM Care Management [phone]

## 2018-05-23 NOTE — Progress Notes (Signed)
Discharge instructions given and went over with patient and patients family at bedside. Prescriptions reviewed. All questions answered. Patient discharged home with family via wheelchair by nursing staff. Madlyn [facility], RN

## 2018-07-17 ENCOUNTER — Emergency Department: Payer: Medicare Other

## 2018-07-17 ENCOUNTER — Encounter: Payer: Self-pay | Admitting: Emergency Medicine

## 2018-07-17 ENCOUNTER — Emergency Department
Admission: EM | Admit: 2018-07-17 | Discharge: 2018-07-17 | Disposition: A | Discharge status: Home / Self Care | Payer: Medicare Other | Attending: Emergency Medicine | Admitting: Emergency Medicine

## 2018-07-17 ENCOUNTER — Other Ambulatory Visit: Payer: Self-pay

## 2018-07-17 DIAGNOSIS — Z79899 Other long term (current) drug therapy: Secondary | ICD-10-CM | POA: Diagnosis not present

## 2018-07-17 DIAGNOSIS — M79662 Pain in left lower leg: Secondary | ICD-10-CM | POA: Diagnosis present

## 2018-07-17 DIAGNOSIS — I10 Essential (primary) hypertension: Secondary | ICD-10-CM | POA: Insufficient documentation

## 2018-07-17 DIAGNOSIS — M25572 Pain in left ankle and joints of left foot: Secondary | ICD-10-CM | POA: Diagnosis not present

## 2018-07-17 DIAGNOSIS — I2510 Atherosclerotic heart disease of native coronary artery without angina pectoris: Secondary | ICD-10-CM | POA: Insufficient documentation

## 2018-07-17 DIAGNOSIS — Z7982 Long term (current) use of aspirin: Secondary | ICD-10-CM | POA: Insufficient documentation

## 2018-07-17 MED ORDER — ACETAMINOPHEN 500 MG PO TABS
ORAL_TABLET | ORAL | Status: AC
  Filled 2018-07-17: qty 2

## 2018-07-17 MED ORDER — ACETAMINOPHEN 500 MG PO TABS
1000.0000 mg | ORAL_TABLET | Freq: Once | ORAL | Status: AC
  Administered 2018-07-17: 1000 mg via ORAL

## 2018-07-17 NOTE — ED Notes (Signed)
Spoke with MD Alfred Levins XR at this time

## 2018-07-17 NOTE — ED Triage Notes (Signed)
Pt to ED via POV with c/o LFT leg/ankle pain. Pt denies any acute injury . PT states chronic leg pain and swelling but worsening. No redness noted. VSS

## 2018-07-17 NOTE — ED Provider Notes (Signed)
[facility] Emergency Department Provider Note  ____________________________________________  Time seen: Approximately 6:28 PM  I have reviewed the triage vital signs and the nursing notes.   HISTORY  Chief Complaint Leg Pain   HPI Kimberly Scott is a 84 y.o. female with history as listed below who presents for evaluation of leg pain.  Patient reports that since yesterday she has had intermittent pain in her left lower extremity.  She points to the inner aspect of her left ankle.  She reports that the pain is sharp, intermittent, lasting seconds at a time.  No pain at this time.  Sometimes the pain radiates up to her leg.  Today the pain was worse which prompted her to go to urgent care.  She was then sent here for further evaluation.  Patient does not recall any trauma, no history of gout, no fever chills, personal or family history of blood clots, recent travel immobilization, hemoptysis, or exogenous hormones.   Past Medical History:  Diagnosis Date  . Anginal pain ([place])   . Arthritis   . Cancer (HCC)    FACIAL SKIN CANCER  . Coronary artery disease   . Headache(784.0)   . Heart murmur   . Hypertension   . Pneumonia    hx of PNA    Patient Active Problem List   Diagnosis Date Noted  . Hyponatremia 05/21/2018  . TIA (transient ischemic attack)   . Acute encephalopathy 05/20/2018  . Coronary arteriosclerosis 09/28/2012  . Hypertension 09/28/2012  . Hyperlipidemia 09/28/2012  . GERD (gastroesophageal reflux disease) 09/28/2012  . Breast cyst 09/28/2012    Past Surgical History:  Procedure Laterality Date  . ABDOMINAL HYSTERECTOMY    . APPENDECTOMY    . cataracts    . CORONARY ARTERY BYPASS GRAFT  10/01/2012   Procedure: CORONARY ARTERY BYPASS GRAFTING (CABG);  Surgeon: Grace Isaac, MD;  Location: [place];  Service: Open Heart Surgery;  Laterality: N/A;  times five using Left Internal Mammary Artery   . ENDOVEIN HARVEST OF GREATER  SAPHENOUS VEIN  10/01/2012   Procedure: ENDOVEIN HARVEST OF GREATER SAPHENOUS VEIN;  Surgeon: Grace Isaac, MD;  Location: [employer];  Service: Open Heart Surgery;  Laterality: Bilateral;  . KNEE SURGERY    . TONSILLECTOMY    . TOTAL KNEE ARTHROPLASTY     right knee    Prior to Admission medications   Medication Sig Start Date End Date Taking? Authorizing Provider  ascorbic acid (VITAMIN C) 500 MG tablet Take 1,000 mg by mouth daily.    [provider]  aspirin EC 81 MG tablet Take 81 mg by mouth daily.    [provider]  atorvastatin (LIPITOR) 40 MG tablet Take 1 tablet (40 mg total) by mouth daily at 6 PM. 05/23/18   Leslye Peer, Richard, MD  benazepril (LOTENSIN) 20 MG tablet Take 20 mg by mouth daily.    [provider]  calcium carbonate (TUMS - DOSED IN MG ELEMENTAL CALCIUM) 500 MG chewable tablet Chew 1 tablet by mouth daily as needed for heartburn.     [provider]  carbidopa-levodopa (SINEMET IR) 25-100 MG tablet Take 1 tablet by mouth 3 (three) times daily.    [provider]  cholecalciferol (VITAMIN D) 1000 units tablet Take 1,000 Units by mouth daily.    [provider]  clopidogrel (PLAVIX) 75 MG tablet Take 1 tablet (75 mg total) by mouth daily. 05/23/18   Loletha Grayer, MD  Cyanocobalamin (B-12 IJ)  Inject 1 Dose as directed every 30 (thirty) days.    [provider]  feeding supplement, ENSURE ENLIVE, (ENSURE ENLIVE) LIQD Take 237 mLs by mouth 2 (two) times daily between meals. 05/23/18   Loletha Grayer, MD  triamcinolone ointment (KENALOG) 0.1 % Apply 1 application topically 2 (two) times daily as needed.    [provider]    Allergies Omega 3  [fish oil]; Neosporin [neomycin-bacitracin zn-polymyx]; and Tape  No family history on file.  Social History Social History   Tobacco Use  . Smoking status: Never Smoker  . Smokeless tobacco: Never Used  Substance Use Topics  . Alcohol use: No  .  Drug use: No    Review of Systems  Constitutional: Negative for fever. Eyes: Negative for visual changes. ENT: Negative for sore throat. Neck: No neck pain  Cardiovascular: Negative for chest pain. Respiratory: Negative for shortness of breath. Gastrointestinal: Negative for abdominal pain, vomiting or diarrhea. Genitourinary: Negative for dysuria. Musculoskeletal: Negative for back pain. + L ankle pain Skin: Negative for rash. Neurological: Negative for headaches, weakness or numbness. Psych: No SI or HI  ____________________________________________   PHYSICAL EXAM:  VITAL SIGNS: ED Triage Vitals [07/17/18 1603]  Enc Vitals Group     BP (!) 160/66     Pulse Rate 69     Resp 16     Temp 97.7 F (36.5 C)     Temp Source Oral     SpO2 95 %     Weight 160 lb (72.6 kg)     Height 5\' 5"  (1.651 m)     Head Circumference      Peak Flow      Pain Score 10     Pain Loc      Pain Edu?      Excl. in [place]?     Constitutional: Alert and oriented. Well appearing and in no apparent distress. HEENT:      Head: Normocephalic and atraumatic.         Eyes: Conjunctivae are normal. Sclera is non-icteric.       Mouth/Throat: Mucous membranes are moist.       Neck: Supple with no signs of meningismus. Cardiovascular: Regular rate and rhythm. No murmurs, gallops, or rubs. 2+ symmetrical distal pulses are present in all extremities. No JVD. Respiratory: Normal respiratory effort. Lungs are clear to auscultation bilaterally. No wheezes, crackles, or rhonchi.  Gastrointestinal: Soft, non tender, and non distended with positive bowel sounds. No rebound or guarding. Musculoskeletal: There is moderate amount of swelling overlying the medial aspect of the left ankle with no erythema or warmth, full painless range of motion of the ankle, there is no tenderness to palpation of any bony structures on the foot or ankle, normal pulses.   Neurologic: Normal speech and language. Face is symmetric.  Moving all extremities. No gross focal neurologic deficits are appreciated. Skin: Skin is warm, dry and intact. No rash noted. Psychiatric: Mood and affect are normal. Speech and behavior are normal.  ____________________________________________   LABS (all labs ordered are listed, but only abnormal results are displayed)  Labs Reviewed - No data to display ____________________________________________  EKG  none  ____________________________________________  RADIOLOGY  I have personally reviewed the images performed during this visit and I agree with the Radiologist's read.   Interpretation by Radiologist:  Dg Tibia/fibula Left  Result Date: 07/17/2018 CLINICAL DATA:  Pain in the anterior lower left tibia, no injury EXAM: LEFT TIBIA AND FIBULA - 2 VIEW  COMPARISON:  None. FINDINGS: Unicompartmental medial compartment knee replacement is noted on the left without complicating features. The tibia and fibula appear intact and normally aligned. Vascular calcifications are present in the soft tissues. IMPRESSION: 1. No acute abnormality. 2. Unicompartmental medial compartment joint replacement of the left knee. Electronically Signed   By: Ivar Drape M.D.   On: 07/17/2018 17:07   Dg Ankle 2 Views Left  Result Date: 07/17/2018 CLINICAL DATA:  Left ankle pain without known injury. EXAM: LEFT ANKLE - 2 VIEW COMPARISON:  Tibia and fibula radiographs from 07/17/2018 FINDINGS: The distal tibia and fibula appear intact. The ankle mortise is maintained. No acute fracture or joint dislocation is identified. Soft tissue prominence about the included left leg and malleoli likely reflect patient body habitus. Calcaneal enthesopathy is seen. The tibial arteriosclerosis is noted. Phleboliths are seen along the anterior aspect of the included leg. IMPRESSION: No acute fracture nor joint dislocation of the left ankle. Soft tissue prominence is more likely related to patient body habitus. Electronically Signed    By: Ashley Royalty M.D.   On: 07/17/2018 18:37   US Venous Img Lower Unilateral Left  Result Date: 07/17/2018 CLINICAL DATA:  LEFT lower extremity pain and swelling for 1 day EXAM: LEFT LOWER EXTREMITY VENOUS DOPPLER ULTRASOUND TECHNIQUE: Gray-scale sonography with graded compression, as well as color Doppler and duplex ultrasound were performed to evaluate the lower extremity deep venous systems from the level of the common femoral vein and including the common femoral, femoral, profunda femoral, popliteal and calf veins including the posterior tibial, peroneal and gastrocnemius veins when visible. The superficial great saphenous vein was also interrogated. Spectral Doppler was utilized to evaluate flow at rest and with distal augmentation maneuvers in the common femoral, femoral and popliteal veins. COMPARISON:  None FINDINGS: Contralateral Common Femoral Vein: Respiratory phasicity is normal and symmetric with the symptomatic side. No evidence of thrombus. Normal compressibility. Common Femoral Vein: No evidence of thrombus. Normal compressibility, respiratory phasicity and response to augmentation. Saphenofemoral Junction: No evidence of thrombus. Normal compressibility and flow on color Doppler imaging. Profunda Femoral Vein: No evidence of thrombus. Normal compressibility and flow on color Doppler imaging. Femoral Vein: No evidence of thrombus. Normal compressibility, respiratory phasicity and response to augmentation. Popliteal Vein: No evidence of thrombus. Normal compressibility, respiratory phasicity and response to augmentation. Calf Veins: Suboptimally visualized Superficial Great Saphenous Vein: No evidence of thrombus. Normal compressibility. Venous Reflux:  None. Other Findings:  None. IMPRESSION: No evidence of deep venous thrombosis in the LEFT lower extremity as above. Electronically Signed   By: Lavonia Dana M.D.   On: 07/17/2018 19:05      ____________________________________________   PROCEDURES  Procedure(s) performed: None Procedures Critical Care performed:  None ____________________________________________   INITIAL IMPRESSION / ASSESSMENT AND PLAN / ED COURSE  84 y.o. female with history as listed below who presents for evaluation of left ankle pain.  Patient has swelling of the inner aspect of the left ankle but no tenderness and painless range of motion.  No clinical signs or symptoms of inflammatory or septic arthritis, cellulitis, dissection.  Will get an x-ray to rule out fracture or any other bony abnormalities.  Will get an ultrasound to rule out DVT especially since patient was hospitalized 2 months ago and is not anticoagulated.  Will give Tylenol for pain.    _________________________ 8:26 PM on 07/17/2018 -----------------------------------------  Doppler studies negative for DVT.  X-ray of the ankle also negative.  Unclear etiology of patient's  pain at this time.  Recommended elevating her leg, taking Tylenol, and applying ice and following up with her primary care doctor for which she already has an appointment scheduled in 3 days.    As part of my medical decision making, I reviewed the following data within the [place] History obtained from family, Nursing notes reviewed and incorporated, Labs reviewed , Old chart reviewed, Radiograph reviewed , Notes from prior ED visits and [place] Controlled Substance Database    Pertinent labs & imaging results that were available during my care of the patient were reviewed by me and considered in my medical decision making (see chart for details).    ____________________________________________   FINAL CLINICAL IMPRESSION(S) / ED DIAGNOSES  Final diagnoses:  Acute left ankle pain      NEW MEDICATIONS STARTED DURING THIS VISIT:  ED Discharge Orders    None       Note:  This document was prepared using Dragon voice recognition  software and may include unintentional dictation errors.    Rudene Re, MD 07/17/18 2027

## 2019-07-19 ENCOUNTER — Ambulatory Visit
Admission: RE | Admit: 2019-07-19 | Discharge: 2019-07-19 | Disposition: A | Discharge status: Home / Self Care | Payer: Medicare Other | Source: Ambulatory Visit | Attending: Internal Medicine | Admitting: Internal Medicine

## 2019-07-19 ENCOUNTER — Other Ambulatory Visit: Payer: Self-pay | Admitting: Internal Medicine

## 2019-07-19 ENCOUNTER — Other Ambulatory Visit: Payer: Self-pay

## 2019-07-19 DIAGNOSIS — I6523 Occlusion and stenosis of bilateral carotid arteries: Secondary | ICD-10-CM | POA: Insufficient documentation

## 2019-07-19 DIAGNOSIS — W19XXXA Unspecified fall, initial encounter: Secondary | ICD-10-CM

## 2019-07-19 DIAGNOSIS — S0511XA Contusion of eyeball and orbital tissues, right eye, initial encounter: Secondary | ICD-10-CM | POA: Diagnosis present

## 2019-07-19 DIAGNOSIS — Y92009 Unspecified place in unspecified non-institutional (private) residence as the place of occurrence of the external cause: Secondary | ICD-10-CM

## 2019-07-19 DIAGNOSIS — G319 Degenerative disease of nervous system, unspecified: Secondary | ICD-10-CM | POA: Diagnosis not present

## 2019-07-19 DIAGNOSIS — H05213 Displacement (lateral) of globe, bilateral: Secondary | ICD-10-CM

## 2019-07-30 ENCOUNTER — Observation Stay
Admission: EM | Admit: 2019-07-30 | Discharge: 2019-08-01 | Disposition: A | Discharge status: Home / Self Care | Payer: Medicare Other | Attending: Internal Medicine | Admitting: Internal Medicine

## 2019-07-30 DIAGNOSIS — I10 Essential (primary) hypertension: Secondary | ICD-10-CM | POA: Diagnosis not present

## 2019-07-30 DIAGNOSIS — Z7902 Long term (current) use of antithrombotics/antiplatelets: Secondary | ICD-10-CM | POA: Insufficient documentation

## 2019-07-30 DIAGNOSIS — G20 Parkinson's disease: Secondary | ICD-10-CM | POA: Insufficient documentation

## 2019-07-30 DIAGNOSIS — R112 Nausea with vomiting, unspecified: Secondary | ICD-10-CM

## 2019-07-30 DIAGNOSIS — N3000 Acute cystitis without hematuria: Secondary | ICD-10-CM | POA: Diagnosis present

## 2019-07-30 DIAGNOSIS — Z79899 Other long term (current) drug therapy: Secondary | ICD-10-CM | POA: Insufficient documentation

## 2019-07-30 DIAGNOSIS — I700 Atherosclerosis of aorta: Secondary | ICD-10-CM | POA: Diagnosis not present

## 2019-07-30 DIAGNOSIS — Z7982 Long term (current) use of aspirin: Secondary | ICD-10-CM | POA: Diagnosis not present

## 2019-07-30 DIAGNOSIS — M1990 Unspecified osteoarthritis, unspecified site: Secondary | ICD-10-CM | POA: Insufficient documentation

## 2019-07-30 DIAGNOSIS — N390 Urinary tract infection, site not specified: Secondary | ICD-10-CM | POA: Diagnosis present

## 2019-07-30 DIAGNOSIS — R918 Other nonspecific abnormal finding of lung field: Secondary | ICD-10-CM | POA: Insufficient documentation

## 2019-07-30 DIAGNOSIS — Z85828 Personal history of other malignant neoplasm of skin: Secondary | ICD-10-CM | POA: Insufficient documentation

## 2019-07-30 DIAGNOSIS — R1110 Vomiting, unspecified: Secondary | ICD-10-CM | POA: Diagnosis present

## 2019-07-30 DIAGNOSIS — Z8673 Personal history of transient ischemic attack (TIA), and cerebral infarction without residual deficits: Secondary | ICD-10-CM | POA: Diagnosis not present

## 2019-07-30 DIAGNOSIS — K219 Gastro-esophageal reflux disease without esophagitis: Secondary | ICD-10-CM | POA: Insufficient documentation

## 2019-07-30 DIAGNOSIS — I2510 Atherosclerotic heart disease of native coronary artery without angina pectoris: Secondary | ICD-10-CM | POA: Diagnosis not present

## 2019-07-30 DIAGNOSIS — K449 Diaphragmatic hernia without obstruction or gangrene: Secondary | ICD-10-CM | POA: Diagnosis not present

## 2019-07-30 DIAGNOSIS — E785 Hyperlipidemia, unspecified: Secondary | ICD-10-CM | POA: Insufficient documentation

## 2019-07-30 DIAGNOSIS — Z951 Presence of aortocoronary bypass graft: Secondary | ICD-10-CM | POA: Insufficient documentation

## 2019-07-30 DIAGNOSIS — K5730 Diverticulosis of large intestine without perforation or abscess without bleeding: Secondary | ICD-10-CM | POA: Insufficient documentation

## 2019-07-31 ENCOUNTER — Emergency Department: Payer: Medicare Other

## 2019-07-31 ENCOUNTER — Encounter: Payer: Self-pay | Admitting: Emergency Medicine

## 2019-07-31 ENCOUNTER — Other Ambulatory Visit: Payer: Self-pay

## 2019-07-31 DIAGNOSIS — N390 Urinary tract infection, site not specified: Secondary | ICD-10-CM | POA: Diagnosis present

## 2019-07-31 LAB — CBC
HCT: 35.2 % — ABNORMAL LOW (ref 36.0–46.0)
Hemoglobin: 11.8 g/dL — ABNORMAL LOW (ref 12.0–15.0)
MCH: 31.5 pg (ref 26.0–34.0)
MCHC: 33.5 g/dL (ref 30.0–36.0)
MCV: 93.9 fL (ref 80.0–100.0)
Platelets: 221 10*3/uL (ref 150–400)
RBC: 3.75 MIL/uL — ABNORMAL LOW (ref 3.87–5.11)
RDW: 12.5 % (ref 11.5–15.5)
WBC: 8.7 10*3/uL (ref 4.0–10.5)
nRBC: 0 % (ref 0.0–0.2)

## 2019-07-31 LAB — MAGNESIUM: Magnesium: 1.9 mg/dL (ref 1.7–2.4)

## 2019-07-31 LAB — BASIC METABOLIC PANEL
Anion gap: 10 (ref 5–15)
BUN: 12 mg/dL (ref 8–23)
CO2: 25 mmol/L (ref 22–32)
Calcium: 9 mg/dL (ref 8.9–10.3)
Chloride: 97 mmol/L — ABNORMAL LOW (ref 98–111)
Creatinine, Ser: 1 mg/dL (ref 0.44–1.00)
GFR calc Af Amer: 58 mL/min — ABNORMAL LOW (ref >60)
GFR calc non Af Amer: 50 mL/min — ABNORMAL LOW (ref >60)
Glucose, Bld: 139 mg/dL — ABNORMAL HIGH (ref 70–99)
Potassium: 4.2 mmol/L (ref 3.5–5.1)
Sodium: 132 mmol/L — ABNORMAL LOW (ref 135–145)

## 2019-07-31 LAB — URINALYSIS, ROUTINE W REFLEX MICROSCOPIC
Bilirubin Urine: NEGATIVE
Glucose, UA: 50 mg/dL — AB
Hgb urine dipstick: NEGATIVE
Ketones, ur: NEGATIVE mg/dL
Nitrite: POSITIVE — AB
Protein, ur: NEGATIVE mg/dL
Specific Gravity, Urine: 1.011 (ref 1.005–1.030)
pH: 7 (ref 5.0–8.0)

## 2019-07-31 LAB — CBC WITH DIFFERENTIAL/PLATELET
Abs Immature Granulocytes: 0.05 10*3/uL (ref 0.00–0.07)
Basophils Absolute: 0.1 10*3/uL (ref 0.0–0.1)
Basophils Relative: 1 %
Eosinophils Absolute: 0.2 10*3/uL (ref 0.0–0.5)
Eosinophils Relative: 2 %
HCT: 36.2 % (ref 36.0–46.0)
Hemoglobin: 12.1 g/dL (ref 12.0–15.0)
Immature Granulocytes: 0 %
Lymphocytes Relative: 15 %
Lymphs Abs: 1.8 10*3/uL (ref 0.7–4.0)
MCH: 31.3 pg (ref 26.0–34.0)
MCHC: 33.4 g/dL (ref 30.0–36.0)
MCV: 93.8 fL (ref 80.0–100.0)
Monocytes Absolute: 1.4 10*3/uL — ABNORMAL HIGH (ref 0.1–1.0)
Monocytes Relative: 12 %
Neutro Abs: 8.3 10*3/uL — ABNORMAL HIGH (ref 1.7–7.7)
Neutrophils Relative %: 70 %
Platelets: 218 10*3/uL (ref 150–400)
RBC: 3.86 MIL/uL — ABNORMAL LOW (ref 3.87–5.11)
RDW: 12.6 % (ref 11.5–15.5)
WBC: 11.8 10*3/uL — ABNORMAL HIGH (ref 4.0–10.5)
nRBC: 0 % (ref 0.0–0.2)

## 2019-07-31 LAB — TROPONIN I (HIGH SENSITIVITY)
Troponin I (High Sensitivity): 11 ng/L (ref <18)
Troponin I (High Sensitivity): 11 ng/L (ref <18)

## 2019-07-31 LAB — COMPREHENSIVE METABOLIC PANEL
ALT: 23 U/L (ref 0–44)
AST: 69 U/L — ABNORMAL HIGH (ref 15–41)
Albumin: 4.1 g/dL (ref 3.5–5.0)
Alkaline Phosphatase: 78 U/L (ref 38–126)
Anion gap: 10 (ref 5–15)
BUN: 14 mg/dL (ref 8–23)
CO2: 25 mmol/L (ref 22–32)
Calcium: 9 mg/dL (ref 8.9–10.3)
Chloride: 96 mmol/L — ABNORMAL LOW (ref 98–111)
Creatinine, Ser: 1.04 mg/dL — ABNORMAL HIGH (ref 0.44–1.00)
GFR calc Af Amer: 56 mL/min — ABNORMAL LOW (ref >60)
GFR calc non Af Amer: 48 mL/min — ABNORMAL LOW (ref >60)
Glucose, Bld: 136 mg/dL — ABNORMAL HIGH (ref 70–99)
Potassium: 3.8 mmol/L (ref 3.5–5.1)
Sodium: 131 mmol/L — ABNORMAL LOW (ref 135–145)
Total Bilirubin: 0.9 mg/dL (ref 0.3–1.2)
Total Protein: 7 g/dL (ref 6.5–8.1)

## 2019-07-31 LAB — LIPASE, BLOOD: Lipase: 34 U/L (ref 11–51)

## 2019-07-31 MED ORDER — ONDANSETRON HCL 4 MG/2ML IJ SOLN
4.0000 mg | Freq: Once | INTRAMUSCULAR | Status: AC
  Administered 2019-07-31: 4 mg via INTRAVENOUS
  Filled 2019-07-31: qty 2

## 2019-07-31 MED ORDER — VITAMIN C 500 MG PO TABS
1000.0000 mg | ORAL_TABLET | Freq: Every day | ORAL | Status: DC
  Administered 2019-07-31 – 2019-08-01 (×2): 1000 mg via ORAL
  Filled 2019-07-31 (×2): qty 2

## 2019-07-31 MED ORDER — BENAZEPRIL HCL 20 MG PO TABS
20.0000 mg | ORAL_TABLET | Freq: Every day | ORAL | Status: DC
  Administered 2019-07-31 – 2019-08-01 (×2): 20 mg via ORAL
  Filled 2019-07-31 (×2): qty 1

## 2019-07-31 MED ORDER — CLOPIDOGREL BISULFATE 75 MG PO TABS
75.0000 mg | ORAL_TABLET | Freq: Every day | ORAL | Status: DC
  Administered 2019-07-31: 09:00:00 75 mg via ORAL
  Filled 2019-07-31: qty 1

## 2019-07-31 MED ORDER — CALCIUM CARBONATE ANTACID 500 MG PO CHEW
1.0000 | CHEWABLE_TABLET | Freq: Every day | ORAL | Status: DC | PRN
  Filled 2019-07-31: qty 1

## 2019-07-31 MED ORDER — VITAMIN D 25 MCG (1000 UNIT) PO TABS
1000.0000 [IU] | ORAL_TABLET | Freq: Every day | ORAL | Status: DC
  Administered 2019-07-31 – 2019-08-01 (×2): 1000 [IU] via ORAL
  Filled 2019-07-31 (×2): qty 1

## 2019-07-31 MED ORDER — ONDANSETRON HCL 4 MG/2ML IJ SOLN: 4.0000 mg | Freq: Four times a day (QID) | INTRAMUSCULAR | Status: DC | PRN

## 2019-07-31 MED ORDER — MAGNESIUM HYDROXIDE 400 MG/5ML PO SUSP
30.0000 mL | Freq: Every day | ORAL | Status: DC | PRN
  Filled 2019-07-31: qty 30

## 2019-07-31 MED ORDER — CARBIDOPA-LEVODOPA 25-100 MG PO TABS: 1.0000 | ORAL_TABLET | Freq: Three times a day (TID) | ORAL | Status: DC

## 2019-07-31 MED ORDER — ACETAMINOPHEN 325 MG PO TABS: 650.0000 mg | ORAL_TABLET | Freq: Four times a day (QID) | ORAL | Status: DC | PRN

## 2019-07-31 MED ORDER — TRAZODONE HCL 50 MG PO TABS
25.0000 mg | ORAL_TABLET | Freq: Every evening | ORAL | Status: DC | PRN
  Filled 2019-07-31: qty 1
  Filled 2019-07-31: qty 0.5

## 2019-07-31 MED ORDER — ATORVASTATIN CALCIUM 20 MG PO TABS
40.0000 mg | ORAL_TABLET | Freq: Every day | ORAL | Status: DC
  Administered 2019-07-31: 40 mg via ORAL
  Filled 2019-07-31 (×2): qty 2

## 2019-07-31 MED ORDER — CYANOCOBALAMIN 1000 MCG/ML IJ SOLN: 1000.0000 ug | INTRAMUSCULAR | Status: DC

## 2019-07-31 MED ORDER — HYDROMORPHONE HCL 1 MG/ML IJ SOLN
0.5000 mg | Freq: Once | INTRAMUSCULAR | Status: AC
  Administered 2019-07-31: 02:00:00 0.5 mg via INTRAVENOUS
  Filled 2019-07-31: qty 1

## 2019-07-31 MED ORDER — ENSURE ENLIVE PO LIQD
237.0000 mL | Freq: Two times a day (BID) | ORAL | Status: DC
  Administered 2019-07-31 – 2019-08-01 (×2): 237 mL via ORAL

## 2019-07-31 MED ORDER — SODIUM CHLORIDE 0.9 % IV SOLN
1.0000 g | Freq: Once | INTRAVENOUS | Status: AC
  Administered 2019-07-31: 04:00:00 1 g via INTRAVENOUS
  Filled 2019-07-31: qty 10

## 2019-07-31 MED ORDER — SODIUM CHLORIDE 0.9 % IV SOLN
1.0000 g | INTRAVENOUS | Status: DC
  Administered 2019-08-01: 1 g via INTRAVENOUS
  Filled 2019-07-31: qty 1

## 2019-07-31 MED ORDER — ACETAMINOPHEN 650 MG RE SUPP
650.0000 mg | Freq: Four times a day (QID) | RECTAL | Status: DC | PRN
  Filled 2019-07-31: qty 1

## 2019-07-31 MED ORDER — CARBIDOPA-LEVODOPA 25-100 MG PO TABS
2.0000 | ORAL_TABLET | Freq: Every day | ORAL | Status: DC
  Administered 2019-07-31 – 2019-08-01 (×2): 2 via ORAL
  Filled 2019-07-31 (×2): qty 2

## 2019-07-31 MED ORDER — ASPIRIN EC 81 MG PO TBEC
81.0000 mg | DELAYED_RELEASE_TABLET | Freq: Every day | ORAL | Status: DC
  Administered 2019-07-31 – 2019-08-01 (×2): 81 mg via ORAL
  Filled 2019-07-31 (×2): qty 1

## 2019-07-31 MED ORDER — FENTANYL CITRATE (PF) 100 MCG/2ML IJ SOLN
25.0000 ug | Freq: Once | INTRAMUSCULAR | Status: AC
  Administered 2019-07-31: 25 ug via INTRAVENOUS
  Filled 2019-07-31: qty 2

## 2019-07-31 MED ORDER — ENOXAPARIN SODIUM 40 MG/0.4ML ~~LOC~~ SOLN
40.0000 mg | Freq: Every day | SUBCUTANEOUS | Status: DC
  Administered 2019-07-31: 40 mg via SUBCUTANEOUS
  Filled 2019-07-31 (×2): qty 0.4

## 2019-07-31 MED ORDER — IOHEXOL 350 MG/ML SOLN
125.0000 mL | Freq: Once | INTRAVENOUS | Status: AC | PRN
  Administered 2019-07-31: 01:00:00 125 mL via INTRAVENOUS

## 2019-07-31 MED ORDER — SODIUM CHLORIDE 0.9 % IV SOLN
INTRAVENOUS | Status: DC
  Administered 2019-07-31 (×2): via INTRAVENOUS

## 2019-07-31 MED ORDER — CARBIDOPA-LEVODOPA 25-100 MG PO TABS
1.0000 | ORAL_TABLET | Freq: Two times a day (BID) | ORAL | Status: DC
  Administered 2019-07-31 (×2): 1 via ORAL
  Filled 2019-07-31 (×4): qty 1

## 2019-07-31 MED ORDER — ONDANSETRON HCL 4 MG PO TABS
4.0000 mg | ORAL_TABLET | Freq: Four times a day (QID) | ORAL | Status: DC | PRN
  Filled 2019-07-31: qty 1

## 2019-07-31 NOTE — ED Notes (Signed)
Pt ambulatory with walker to bathroom

## 2019-07-31 NOTE — ED Notes (Signed)
Patient transported to CT 

## 2019-07-31 NOTE — ED Notes (Signed)
Attempted IV access x2 unsuccessful both attempts.

## 2019-07-31 NOTE — ED Notes (Signed)
ED TO INPATIENT HANDOFF REPORT  ED Nurse Name and Phone #: Caryl Pina, RN, [phone]  S Name/Age/Gender Kimberly Scott Hevia 85 y.o. female Room/Bed: ED37A/ED37A  Code Status   Code Status: Full Code  Home/SNF/Other Home Patient oriented to: self, place, time and situation Is this baseline? Yes   Triage Complete: Triage complete  Chief Complaint [facility] - Chest pain  Triage Note  Pt arrived via EMS from home with reports of R chest pain that started about 1 hour prior to arrival, pt states the pain is in her right breast that is non-radiating.  Per EMS pt also c/o N/V. Pt reports on arrival right side abdominal pain and active vomited about 100cc of emesis.  Pt is alert and oriented, with areas of ecchymosis to right eye and around right breast from previous fall.   Allergies Allergies  Allergen Reactions  . Bacitracin-Neomycin-Polymyxin Swelling and Rash    Blisters Blisters   . Omega 3  [Fish Oil] Other (See Comments)  . Neosporin [Neomycin-Bacitracin Zn-Polymyx] Rash  . Tape Rash    Blisters    Level of Care/Admitting Diagnosis ED Disposition    ED Disposition Condition [facility] Area: [place] [[id number]]  Level of Care: Med-Surg [16]  Covid Evaluation: Asymptomatic Screening Protocol (No Symptoms)  Diagnosis: UTI (urinary tract infection) [id number]  Admitting Physician: Christel Mormon [id number]  Attending Physician: Christel Mormon [id number]  Estimated length of stay: past midnight tomorrow  Certification:: I certify this patient will need inpatient services for at least 2 midnights  PT Class (Do Not Modify): Inpatient [101]  PT Acc Code (Do Not Modify): Private [1]       B Medical/Surgery History Past Medical History:  Diagnosis Date  . Anginal pain ([place])   . Arthritis   . Cancer (H[facility])    FACIAL SKIN CANCER  . Coronary artery disease   . Headache(784.0)   . Heart murmur   . Hypertension   . Pneumonia    hx of PNA    Past Surgical History:  Procedure Laterality Date  . ABDOMINAL HYSTERECTOMY    . APPENDECTOMY    . cataracts    . CORONARY ARTERY BYPASS GRAFT  10/01/2012   Procedure: CORONARY ARTERY BYPASS GRAFTING (CABG);  Surgeon: Grace Isaac, MD;  Location: [place];  Service: Open Heart Surgery;  Laterality: N/A;  times five using Left Internal Mammary Artery   . ENDOVEIN HARVEST OF GREATER SAPHENOUS VEIN  10/01/2012   Procedure: ENDOVEIN HARVEST OF GREATER SAPHENOUS VEIN;  Surgeon: Grace Isaac, MD;  Location: [facility];  Service: Open Heart Surgery;  Laterality: Bilateral;  . KNEE SURGERY    . TONSILLECTOMY    . TOTAL KNEE ARTHROPLASTY     right knee     A IV Location/Drains/Wounds Patient Lines/Drains/Airways Status   Active Line/Drains/Airways    Name:   Placement date:   Placement time:   Site:   Days:   Peripheral IV 07/30/19 Right Forearm   07/30/19    0030    Forearm   1          Intake/Output Last 24 hours No intake or output data in the 24 hours ending 07/31/19 1152  Labs/Imaging Results for orders placed or performed during the hospital encounter of 07/30/19 (from the past 48 hour(s))  CBC with Differential     Status: Abnormal   Collection Time: 07/31/19 12:33 AM  Result Value Ref Range   WBC  11.8 (H) 4.0 - 10.5 K/uL   RBC 3.86 (L) 3.87 - 5.11 MIL/uL   Hemoglobin 12.1 12.0 - 15.0 g/dL   HCT 36.2 36.0 - 46.0 %   MCV 93.8 80.0 - 100.0 fL   MCH 31.3 26.0 - 34.0 pg   MCHC 33.4 30.0 - 36.0 g/dL   RDW 12.6 11.5 - 15.5 %   Platelets 218 150 - 400 K/uL   nRBC 0.0 0.0 - 0.2 %   Neutrophils Relative % 70 %   Neutro Abs 8.3 (H) 1.7 - 7.7 K/uL   Lymphocytes Relative 15 %   Lymphs Abs 1.8 0.7 - 4.0 K/uL   Monocytes Relative 12 %   Monocytes Absolute 1.4 (H) 0.1 - 1.0 K/uL   Eosinophils Relative 2 %   Eosinophils Absolute 0.2 0.0 - 0.5 K/uL   Basophils Relative 1 %   Basophils Absolute 0.1 0.0 - 0.1 K/uL   Immature Granulocytes 0 %   Abs Immature Granulocytes 0.05  0.00 - 0.07 K/uL    Comment: Performed at [facility], [address] [place], [place] [address]  Comprehensive metabolic panel     Status: Abnormal   Collection Time: 07/31/19 12:33 AM  Result Value Ref Range   Sodium 131 (L) 135 - 145 mmol/L   Potassium 3.8 3.5 - 5.1 mmol/L   Chloride 96 (L) 98 - 111 mmol/L   CO2 25 22 - 32 mmol/L   Glucose, Bld 136 (H) 70 - 99 mg/dL   BUN 14 8 - 23 mg/dL   Creatinine, Ser 1.04 (H) 0.44 - 1.00 mg/dL   Calcium 9.0 8.9 - 10.3 mg/dL   Total Protein 7.0 6.5 - 8.1 g/dL   Albumin 4.1 3.5 - 5.0 g/dL   AST 69 (H) 15 - 41 U/L   ALT 23 0 - 44 U/L   Alkaline Phosphatase 78 38 - 126 U/L   Total Bilirubin 0.9 0.3 - 1.2 mg/dL   GFR calc non Af Amer 48 (L) >60 mL/min   GFR calc Af Amer 56 (L) >60 mL/min   Anion gap 10 5 - 15    Comment: Performed at [facility], [address] [place], [place] [address]  Lipase, blood     Status: None   Collection Time: 07/31/19 12:33 AM  Result Value Ref Range   Lipase 34 11 - 51 U/L    Comment: Performed at [facility], [place], [place] [address]  Troponin I (High Sensitivity)     Status: None   Collection Time: 07/31/19 12:33 AM  Result Value Ref Range   Troponin I (High Sensitivity) 11 <18 ng/L    Comment: (NOTE) Elevated high sensitivity troponin I (hsTnI) values and significant  changes across serial measurements may suggest ACS but many other  chronic and acute conditions are known to elevate hsTnI results.  Refer to the "Links" section for chest pain algorithms and additional  guidance. Performed at [facility], [address] [place], [place] [address]   Magnesium     Status: None   Collection Time: 07/31/19 12:33 AM  Result Value Ref Range   Magnesium 1.9 1.7 - 2.4 mg/dL    Comment: Performed at [facility], [address] [place], [place] [address]  Urinalysis, Routine w reflex microscopic     Status: Abnormal   Collection Time:  07/31/19  1:25 AM  Result Value Ref Range   Color, Urine YELLOW (A) YELLOW   APPearance HAZY (  A) CLEAR   Specific Gravity, Urine 1.011 1.005 - 1.030   pH 7.0 5.0 - 8.0   Glucose, UA 50 (A) NEGATIVE mg/dL   Hgb urine dipstick NEGATIVE NEGATIVE   Bilirubin Urine NEGATIVE NEGATIVE   Ketones, ur NEGATIVE NEGATIVE mg/dL   Protein, ur NEGATIVE NEGATIVE mg/dL   Nitrite POSITIVE (A) NEGATIVE   Leukocytes,Ua MODERATE (A) NEGATIVE   RBC / HPF 0-5 0 - 5 RBC/hpf   WBC, UA 21-50 0 - 5 WBC/hpf   Bacteria, UA RARE (A) NONE SEEN   Squamous Epithelial / LPF 0-5 0 - 5    Comment: Performed at [facility], [place], [place] [address]  Troponin I (High Sensitivity)     Status: None   Collection Time: 07/31/19  2:15 AM  Result Value Ref Range   Troponin I (High Sensitivity) 11 <18 ng/L    Comment: (NOTE) Elevated high sensitivity troponin I (hsTnI) values and significant  changes across serial measurements may suggest ACS but many other  chronic and acute conditions are known to elevate hsTnI results.  Refer to the "Links" section for chest pain algorithms and additional  guidance. Performed at [facility], [address] [place], [place] [id number]   Basic metabolic panel     Status: Abnormal   Collection Time: 07/31/19  6:00 AM  Result Value Ref Range   Sodium 132 (L) 135 - 145 mmol/L   Potassium 4.2 3.5 - 5.1 mmol/L   Chloride 97 (L) 98 - 111 mmol/L   CO2 25 22 - 32 mmol/L   Glucose, Bld 139 (H) 70 - 99 mg/dL   BUN 12 8 - 23 mg/dL   Creatinine, Ser 1.00 0.44 - 1.00 mg/dL   Calcium 9.0 8.9 - 10.3 mg/dL   GFR calc non Af Amer 50 (L) >60 mL/min   GFR calc Af Amer 58 (L) >60 mL/min   Anion gap 10 5 - 15    Comment: Performed at [facility], [address] [place], [place] [address]  CBC     Status: Abnormal   Collection Time: 07/31/19  6:00 AM  Result Value Ref Range   WBC 8.7 4.0 - 10.5 K/uL   RBC 3.75 (L) 3.87 - 5.11 MIL/uL   Hemoglobin  11.8 (L) 12.0 - 15.0 g/dL   HCT 35.2 (L) 36.0 - 46.0 %   MCV 93.9 80.0 - 100.0 fL   MCH 31.5 26.0 - 34.0 pg   MCHC 33.5 30.0 - 36.0 g/dL   RDW 12.5 11.5 - 15.5 %   Platelets 221 150 - 400 K/uL   nRBC 0.0 0.0 - 0.2 %    Comment: Performed at [facility], [address] [place], [place] [address]   Ct Angio Chest/abd/pel For Dissection W And/or Wo Contrast  Result Date: 07/31/2019 CLINICAL DATA:  Sudden onset of severe chest and abdominal pain. Vomiting. Patient reports right chest/breast pain. EXAM: CT ANGIOGRAPHY CHEST, ABDOMEN AND PELVIS TECHNIQUE: Multidetector CT imaging through the chest, abdomen and pelvis was performed using the standard protocol during bolus administration of intravenous contrast. Multiplanar reconstructed images and MIPs were obtained and reviewed to evaluate the vascular anatomy. CONTRAST:  130mL OMNIPAQUE IOHEXOL 350 MG/ML SOLN COMPARISON:  No recent imaging. Most recent chest x-ray 05/21/2018. Abdominal CT 06/03/2013 FINDINGS: CTA CHEST FINDINGS Cardiovascular: Aortic atherosclerosis and tortuosity without dissection, aneurysm, acute aortic syndrome or hematoma. Conventional branching pattern from the aortic arch. Branch vessels are patent. Central pulmonary arteries are  patent to the segmental level. Prior CABG with calcification of native coronary arteries. Mild cardiomegaly with primarily atrial enlargement contrast refluxing into the hepatic veins and IVC. No pericardial effusion. Mediastinum/Nodes: No enlarged mediastinal or hilar lymph nodes. Decompressed esophagus with small hiatal hernia. No visualized thyroid nodule. Lungs/Pleura: Minimal subpleural atelectasis in the lower lobes. No focal airspace disease. No pulmonary edema. No pleural effusion. Calcified granulomas in the right lower lobe. 2 mm noncalcified pulmonary nodule in the right upper lobe series 7, image 71. Pulmonary nodules are similar to 06/23/2009 chest CT and considered benign.  Musculoskeletal: Median sternotomy. There are no acute or suspicious osseous abnormalities. Bone island in the midthoracic spine. Review of the MIP images confirms the above findings. CTA ABDOMEN AND PELVIS FINDINGS VASCULAR Aorta: Normal in caliber without atherosclerotic calcifications. No aneurysm, dissection, or vasculitis. Celiac: Separate origin of the hepatic and splenic arteries from the aorta. Early branching of the right and left hepatic arteries. Atherosclerosis at the origin. SMA: Atherosclerosis of the origin. Branch vessels are patent. No acute findings. Renals: Both renal arteries are patent. No dissection or aneurysm. Tiny assess for E renal artery to the right lower pole. IMA: Patent without evidence of aneurysm, dissection, vasculitis or significant stenosis. Inflow: Atherosclerotic calcifications without aneurysm, dissection, vasculitis or severe stenosis. Veins: No obvious venous abnormality within the limitations of this arterial phase study. Review of the MIP images confirms the above findings. NON-VASCULAR Hepatobiliary: Calcified granuloma. No obvious focal hepatic abnormality allowing for arterial phase imaging. Gallbladder is distended. No pericholecystic inflammation. No calcified gallstone. No biliary dilatation. Pancreas: No ductal dilatation or inflammation. Spleen: Normal in size and arterial enhancement. Adrenals/Urinary Tract: Normal adrenal glands. Similar similar appearance of bilateral renal pelvis and ureteral prominence from prior exam. No obstructing calculi. No perinephric edema. Physiologically distended bladder without wall thickening. Stomach/Bowel: Small hiatal hernia. Ingested material in the stomach. No evidence of gastric wall thickening. No small bowel obstruction or inflammatory change. The appendix is not confidently visualized. Moderate stool burden in the colon with colonic tortuosity. No colonic wall thickening or inflammatory change. Diverticulosis of the distal  colon without diverticulitis. Stool distends the rectum. Lymphatic: No enlarged lymph nodes in the abdomen or pelvis. Punctate calcified mesenteric nodes consistent with prior granulomatous disease. Reproductive: Status post hysterectomy. No adnexal masses. Other: No free air, free fluid, or intra-abdominal fluid collection. Musculoskeletal: There are no acute or suspicious osseous abnormalities. Review of the MIP images confirms the above findings. IMPRESSION: 1. No aortic dissection or acute aortic abnormality. Moderate thoracoabdominal aortic atherosclerosis. 2. Cardiomegaly with contrast refluxing into the hepatic veins and IVC consistent with elevated right heart pressures. 3. Chronic finding in the chest include small pulmonary nodules there are stable dating back to 2010 and considered benign. 4. Chronic findings in the abdomen and pelvis include colonic diverticulosis and small hiatal hernia. Aortic Atherosclerosis (ICD10-I70.0). Electronically Signed   By: Keith Rake M.D.   On: 07/31/2019 02:35    Pending Labs Unresulted Labs (From admission, onward)    Start     Ordered   07/31/19 0324  Urine culture  Add-on,   AD     07/31/19 0324          Vitals/Pain Today's Vitals   07/31/19 0446 07/31/19 0638 07/31/19 0727 07/31/19 0900  BP: (!) 154/73 (!) 165/66  (!) 138/99  Pulse: 64     Resp:  15  17  Temp:      TempSrc:      SpO2: 94%  97%  95%  Weight:      Height:      PainSc: 0-No pain  Asleep     Isolation Precautions No active isolations  Medications Medications  aspirin EC tablet 81 mg (81 mg Oral Given 07/31/19 0913)  atorvastatin (LIPITOR) tablet 40 mg (has no administration in time range)  benazepril (LOTENSIN) tablet 20 mg (20 mg Oral Given 07/31/19 0914)  calcium carbonate (TUMS - dosed in mg elemental calcium) chewable tablet 200 mg of elemental calcium (has no administration in time range)  clopidogrel (PLAVIX) tablet 75 mg (75 mg Oral Given 07/31/19 0913)   cyanocobalamin ((VITAMIN B-12)) injection 1,000 mcg (has no administration in time range)  vitamin C (ASCORBIC ACID) tablet 1,000 mg (1,000 mg Oral Given 07/31/19 0921)  cholecalciferol (VITAMIN D3) tablet 1,000 Units (1,000 Units Oral Given 07/31/19 0913)  feeding supplement (ENSURE ENLIVE) (ENSURE ENLIVE) liquid 237 mL (237 mLs Oral Not Given 07/31/19 0922)  enoxaparin (LOVENOX) injection 40 mg (40 mg Subcutaneous Not Given 07/31/19 0345)  0.9 %  sodium chloride infusion ( Intravenous New Bag/Given 07/31/19 0354)  acetaminophen (TYLENOL) tablet 650 mg (has no administration in time range)    Or  acetaminophen (TYLENOL) suppository 650 mg (has no administration in time range)  traZODone (DESYREL) tablet 25 mg (has no administration in time range)  magnesium hydroxide (MILK OF MAGNESIA) suspension 30 mL (has no administration in time range)  ondansetron (ZOFRAN) tablet 4 mg (has no administration in time range)    Or  ondansetron (ZOFRAN) injection 4 mg (has no administration in time range)  cefTRIAXone (ROCEPHIN) 1 g in sodium chloride 0.9 % 100 mL IVPB (has no administration in time range)  carbidopa-levodopa (SINEMET IR) 25-100 MG per tablet immediate release 2 tablet (2 tablets Oral Given 07/31/19 0900)  carbidopa-levodopa (SINEMET IR) 25-100 MG per tablet immediate release 1 tablet (has no administration in time range)  ondansetron (ZOFRAN) injection 4 mg (4 mg Intravenous Given 07/31/19 0038)  fentaNYL (SUBLIMAZE) injection 25 mcg (25 mcg Intravenous Given 07/31/19 0038)  iohexol (OMNIPAQUE) 350 MG/ML injection 125 mL (125 mLs Intravenous Contrast Given 07/31/19 0127)  HYDROmorphone (DILAUDID) injection 0.5 mg (0.5 mg Intravenous Given 07/31/19 0201)  ondansetron (ZOFRAN) injection 4 mg (4 mg Intravenous Given 07/31/19 0159)  cefTRIAXone (ROCEPHIN) 1 g in sodium chloride 0.9 % 100 mL IVPB (0 g Intravenous Stopped 07/31/19 0555)    Mobility walks with device High fall risk   Focused  Assessments N/A   R Recommendations: See Admitting Provider Note  Report given to:   Additional Notes: N/A

## 2019-07-31 NOTE — ED Notes (Signed)
Hospitalist to bedside.

## 2019-07-31 NOTE — ED Notes (Signed)
Pt states the pain in her chest started after swallowing a Vitamin C capsule tonight.

## 2019-07-31 NOTE — ED Triage Notes (Signed)
Pt arrived via EMS from home with reports of R chest pain that started about 1 hour prior to arrival, pt states the pain is in her right breast that is non-radiating.  Per EMS pt also c/o N/V. Pt reports on arrival right side abdominal pain and active vomited about 100cc of emesis.  Pt is alert and oriented, with areas of ecchymosis to right eye and around right breast from previous fall.

## 2019-07-31 NOTE — ED Notes (Signed)
ED Provider at bedside. 

## 2019-07-31 NOTE — H&P (Addendum)
[facility] [place] NAME: Kimberly Scott    MR#:  [id number]  DATE OF BIRTH:  Apr 21, 1928  DATE OF ADMISSION:  07/30/2019  PRIMARY CARE PHYSICIAN: Adin Hector, MD   REQUESTING/REFERRING PHYSICIAN: Marjean Donna, MD  CHIEF COMPLAINT:   Chief Complaint  Patient presents with  . Chest Pain  . Abdominal Pain    HISTORY OF PRESENT ILLNESS:  Kimberly Scott  is a 85 y.o. Caucasian female with a known history of coronary artery disease, hypertension, osteoarthritis and Parkinson's disease, presented to the emergency room with acute onset of right-sided chest wall/breast pain with associated right mid abdominal pain, nausea and vomiting without bilious vomitus or hematemesis or diarrhea.  She denied any melena or bright red blood per rectum.  No fever or chills.  She denied any headache or dizziness or blurred vision.  She admits to urinary frequency and urgency without dysuria or hematuria or flank pain.  More than 10 days ago she fell when she tumbled and hit her face against hardwood floor with subsequent right infraorbital and cheek contusion.  Upon presentation to the emergency room, blood pressure was 180/64 with a pulse of 57 and otherwise normal vital signs.  Labs revealed mild hyponatremia hypochloremia and her troponin I was 11 and later 11.  CBC showed minimal leukocytosis of 11.8 with mild neutrophilia.  Urinalysis showed 21-50 WBCs, rare bacteria positive nitrite and 50 glucose.  The patient had urine culture sent.  She had an EKG that: Normal sinus rhythm with rate of 76 with left axis deviation and left anterior fascicular block, PVCs and Q waves in V1 and V2.  Chest and abdominal CTA showed: 1. No aortic dissection or acute aortic abnormality. Moderate thoracoabdominal aortic atherosclerosis. 2. Cardiomegaly with contrast refluxing into the hepatic veins and IVC consistent with elevated right heart pressures. 3. Chronic  finding in the chest include small pulmonary nodules there are stable dating back to 2010 and considered benign. 4. Chronic findings in the abdomen and pelvis include colonic diverticulosis and small hiatal hernia.  The patient was given IV Rocephin, IV fentanyl and Dilaudid as well as IV Zofran in the ER.  She will be admitted to a medically monitored bed for further evaluation and management. PAST MEDICAL HISTORY:   Past Medical History:  Diagnosis Date  . Anginal pain ([place])   . Arthritis   . Cancer ([facility])    FACIAL SKIN CANCER  . Coronary artery disease   . Headache(784.0)   . Heart murmur   . Hypertension   . Pneumonia    hx of PNA    PAST SURGICAL HISTORY:   Past Surgical History:  Procedure Laterality Date  . ABDOMINAL HYSTERECTOMY    . APPENDECTOMY    . cataracts    . CORONARY ARTERY BYPASS GRAFT  10/01/2012   Procedure: CORONARY ARTERY BYPASS GRAFTING (CABG);  Surgeon: Grace Isaac, MD;  Location: [facility];  Service: Open Heart Surgery;  Laterality: N/A;  times five using Left Internal Mammary Artery   . ENDOVEIN HARVEST OF GREATER SAPHENOUS VEIN  10/01/2012   Procedure: ENDOVEIN HARVEST OF GREATER SAPHENOUS VEIN;  Surgeon: Grace Isaac, MD;  Location: [facility];  Service: Open Heart Surgery;  Laterality: Bilateral;  . KNEE SURGERY    . TONSILLECTOMY    . TOTAL KNEE ARTHROPLASTY     right knee    SOCIAL HISTORY:   Social History   Tobacco Use  .  Smoking status: Never Smoker  . Smokeless tobacco: Never Used  Substance Use Topics  . Alcohol use: No    FAMILY HISTORY:  No family history on file.  DRUG ALLERGIES:   Allergies  Allergen Reactions  . Bacitracin-Neomycin-Polymyxin Swelling and Rash    Blisters Blisters   . Omega 3  [Fish Oil] Other (See Comments)  . Neosporin [Neomycin-Bacitracin Zn-Polymyx] Rash  . Tape Rash    Blisters    REVIEW OF SYSTEMS:   ROS As per history of present illness. All pertinent systems were reviewed above.  Constitutional,  HEENT, cardiovascular, respiratory, GI, GU, musculoskeletal, neuro, psychiatric, endocrine,  integumentary and hematologic systems were reviewed and are otherwise  negative/unremarkable except for positive findings mentioned above in the HPI.   MEDICATIONS AT HOME:   Prior to Admission medications   Medication Sig Start Date End Date Taking? Authorizing Provider  ascorbic acid (VITAMIN C) 500 MG tablet Take 1,000 mg by mouth daily.    [provider]  aspirin EC 81 MG tablet Take 81 mg by mouth daily.    [provider]  atorvastatin (LIPITOR) 40 MG tablet Take 1 tablet (40 mg total) by mouth daily at 6 PM. 05/23/18   Leslye Peer, Richard, MD  benazepril (LOTENSIN) 20 MG tablet Take 20 mg by mouth daily.    [provider]  calcium carbonate (TUMS - DOSED IN MG ELEMENTAL CALCIUM) 500 MG chewable tablet Chew 1 tablet by mouth daily as needed for heartburn.     [provider]  carbidopa-levodopa (SINEMET IR) 25-100 MG tablet Take 1 tablet by mouth 3 (three) times daily.    [provider]  cholecalciferol (VITAMIN D) 1000 units tablet Take 1,000 Units by mouth daily.    [provider]  clopidogrel (PLAVIX) 75 MG tablet Take 1 tablet (75 mg total) by mouth daily. 05/23/18   Wieting, Richard, MD  Cyanocobalamin (B-12 IJ) Inject 1 Dose as directed every 30 (thirty) days.    [provider]  feeding supplement, ENSURE ENLIVE, (ENSURE ENLIVE) LIQD Take 237 mLs by mouth 2 (two) times daily between meals. 05/23/18   Loletha Grayer, MD  triamcinolone ointment (KENALOG) 0.1 % Apply 1 application topically 2 (two) times daily as needed.    [provider]      VITAL SIGNS:  Blood pressure (!) 180/64, pulse (!) 57, temperature 97.8 F (36.6 C), temperature source Oral, resp. rate 18, height 5\' 4"  (1.626 m), weight 78.5 kg, SpO2 94 %.  PHYSICAL EXAMINATION:  Physical Exam  GENERAL:  85 y.o.-year-old Caucasian  female patient lying in the bed with no acute distress.  EYES: Pupils equal, round, reactive to light and accommodation. No scleral icterus. Extraocular muscles intact.  HEENT: Head atraumatic, normocephalic. Oropharynx and nasopharynx clear.  NECK:  Supple, no jugular venous distention. No thyroid enlargement, no tenderness.  LUNGS: Normal breath sounds bilaterally, no wheezing, rales,rhonchi or crepitation. No use of accessory muscles of respiration.  She had bilateral chest wall tenderness. CARDIOVASCULAR: Regular rate and rhythm, S1, S2 normal. No murmurs, rubs, or gallops.  ABDOMEN: Soft, nondistended with right para umbilical tenderness without rebound tenderness guarding or rigidity.  Bowel sounds present. No organomegaly or mass.  EXTREMITIES: No pedal edema, cyanosis, or clubbing.  NEUROLOGIC: Cranial nerves II through XII are intact. Muscle strength 5/5 in all extremities. Sensation intact. Gait not checked.  PSYCHIATRIC: The patient is alert and oriented x 3.  Normal affect and good eye contact. SKIN: No obvious rash, lesion, or  ulcer.   LABORATORY PANEL:   CBC Recent Labs  Lab 07/31/19 0033  WBC 11.8*  HGB 12.1  HCT 36.2  PLT 218   ------------------------------------------------------------------------------------------------------------------  Chemistries  Recent Labs  Lab 07/31/19 0033  NA 131*  K 3.8  CL 96*  CO2 25  GLUCOSE 136*  BUN 14  CREATININE 1.04*  CALCIUM 9.0  MG 1.9  AST 69*  ALT 23  ALKPHOS 78  BILITOT 0.9   ------------------------------------------------------------------------------------------------------------------  Cardiac Enzymes No results for input(s): TROPONINI in the last 168 hours. ------------------------------------------------------------------------------------------------------------------  RADIOLOGY:  Ct Angio Chest/abd/pel For Dissection W And/or Wo Contrast  Result Date: 07/31/2019 CLINICAL DATA:  Sudden onset of  severe chest and abdominal pain. Vomiting. Patient reports right chest/breast pain. EXAM: CT ANGIOGRAPHY CHEST, ABDOMEN AND PELVIS TECHNIQUE: Multidetector CT imaging through the chest, abdomen and pelvis was performed using the standard protocol during bolus administration of intravenous contrast. Multiplanar reconstructed images and MIPs were obtained and reviewed to evaluate the vascular anatomy. CONTRAST:  145mL OMNIPAQUE IOHEXOL 350 MG/ML SOLN COMPARISON:  No recent imaging. Most recent chest x-ray 05/21/2018. Abdominal CT 06/03/2013 FINDINGS: CTA CHEST FINDINGS Cardiovascular: Aortic atherosclerosis and tortuosity without dissection, aneurysm, acute aortic syndrome or hematoma. Conventional branching pattern from the aortic arch. Branch vessels are patent. Central pulmonary arteries are patent to the segmental level. Prior CABG with calcification of native coronary arteries. Mild cardiomegaly with primarily atrial enlargement contrast refluxing into the hepatic veins and IVC. No pericardial effusion. Mediastinum/Nodes: No enlarged mediastinal or hilar lymph nodes. Decompressed esophagus with small hiatal hernia. No visualized thyroid nodule. Lungs/Pleura: Minimal subpleural atelectasis in the lower lobes. No focal airspace disease. No pulmonary edema. No pleural effusion. Calcified granulomas in the right lower lobe. 2 mm noncalcified pulmonary nodule in the right upper lobe series 7, image 71. Pulmonary nodules are similar to 06/23/2009 chest CT and considered benign. Musculoskeletal: Median sternotomy. There are no acute or suspicious osseous abnormalities. Bone island in the midthoracic spine. Review of the MIP images confirms the above findings. CTA ABDOMEN AND PELVIS FINDINGS VASCULAR Aorta: Normal in caliber without atherosclerotic calcifications. No aneurysm, dissection, or vasculitis. Celiac: Separate origin of the hepatic and splenic arteries from the aorta. Early branching of the right and left  hepatic arteries. Atherosclerosis at the origin. SMA: Atherosclerosis of the origin. Branch vessels are patent. No acute findings. Renals: Both renal arteries are patent. No dissection or aneurysm. Tiny assess for E renal artery to the right lower pole. IMA: Patent without evidence of aneurysm, dissection, vasculitis or significant stenosis. Inflow: Atherosclerotic calcifications without aneurysm, dissection, vasculitis or severe stenosis. Veins: No obvious venous abnormality within the limitations of this arterial phase study. Review of the MIP images confirms the above findings. NON-VASCULAR Hepatobiliary: Calcified granuloma. No obvious focal hepatic abnormality allowing for arterial phase imaging. Gallbladder is distended. No pericholecystic inflammation. No calcified gallstone. No biliary dilatation. Pancreas: No ductal dilatation or inflammation. Spleen: Normal in size and arterial enhancement. Adrenals/Urinary Tract: Normal adrenal glands. Similar similar appearance of bilateral renal pelvis and ureteral prominence from prior exam. No obstructing calculi. No perinephric edema. Physiologically distended bladder without wall thickening. Stomach/Bowel: Small hiatal hernia. Ingested material in the stomach. No evidence of gastric wall thickening. No small bowel obstruction or inflammatory change. The appendix is not confidently visualized. Moderate stool burden in the colon with colonic tortuosity. No colonic wall thickening or inflammatory change. Diverticulosis of the distal colon without diverticulitis. Stool distends the rectum. Lymphatic: No enlarged lymph nodes in the abdomen  or pelvis. Punctate calcified mesenteric nodes consistent with prior granulomatous disease. Reproductive: Status post hysterectomy. No adnexal masses. Other: No free air, free fluid, or intra-abdominal fluid collection. Musculoskeletal: There are no acute or suspicious osseous abnormalities. Review of the MIP images confirms the above  findings. IMPRESSION: 1. No aortic dissection or acute aortic abnormality. Moderate thoracoabdominal aortic atherosclerosis. 2. Cardiomegaly with contrast refluxing into the hepatic veins and IVC consistent with elevated right heart pressures. 3. Chronic finding in the chest include small pulmonary nodules there are stable dating back to 2010 and considered benign. 4. Chronic findings in the abdomen and pelvis include colonic diverticulosis and small hiatal hernia. Aortic Atherosclerosis (ICD10-I70.0). Electronically Signed   By: Keith Rake M.D.   On: 07/31/2019 02:35      IMPRESSION AND PLAN:   1.  UTI.  The patient will be admitted to medical monitored bed.  She be placed on hydration with IV normal saline and continued antibiotic therapy with IV Rocephin.  We will follow her urine culture and sensitivity.  2.  Nausea and vomiting.  She will be placed on PRN antiemetics and hydration.  This could be related to early pyelonephritis not showing on her abdominal and pelvic CT scan.  The patient will be placed on IV Protonix for the possibility of mild acute gastritis.  3.  Chest pain.  This is likely atypical and it has been mainly right-sided.  She was ruled out.  PE.  Will follow 2 more sets of troponin I in a.m.  4.  Hyponatremia and hypochloremia, likely hypovolemic.  The patient will be hydrated with IV normal saline and will follow her BMP in a.m.  5.  Parkinson's disease.  Her carbidopa-levodopa will be continued.  6.  Dyslipidemia.  Statin therapy will be resumed.  7..  Hypertension.  We will continue her Micardis.  8.  DVT prophylaxis subcutaneous Lovenox.  All the records are reviewed and case discussed with ED provider. The plan of care was discussed in details with the patient (and family). I answered all questions. The patient agreed to proceed with the above mentioned plan. Further management will depend upon hospital course.   CODE STATUS: Full code  TOTAL TIME  TAKING CARE OF THIS PATIENT: 55 minutes.    Christel Mormon M.D on 07/31/2019 at 3:35 AM  Pager - [phone]  After 6pm go to [email or web address] - password [email or web address] [facility]  [employer] [facility]  Office  [phone]  CC: Primary care physician; Adin Hector, MD   Note: This dictation was prepared with Dragon dictation along with smaller phrase technology. Any transcriptional errors that result from this process are unintentional.

## 2019-07-31 NOTE — ED Notes (Signed)
Pt ambulatory to bathroom with walker.

## 2019-07-31 NOTE — Progress Notes (Signed)
Pt high fall risk as evidenced by recent fall and trauma to right eye with large ecchymosis present.  Explained to pt and dtr the hospital policy to place pts in the high fall risk category in low beds.  Explained that the bed in low to the floor and if she were to fall from the bed to floor there is less chance of her being hurt.  Pt refusing low bed.  States that she is not a fall risk and that her bed at home is higher than hospital bed.  Another staff member came in and attempted to explain to pt the rationale behind moving to a low bed for pt safety but pt still refuses to be transferred to a low bed.  Instructed pt to use call light if she needs to get up so that staff can help prevent her from falling.  Pt agreed.  Bed alarm in use. Dorna Bloom RN

## 2019-07-31 NOTE — ED Provider Notes (Signed)
[facility] Emergency Department Provider Note  ____________________________________________   First MD Initiated Contact with Patient 07/31/19 0007     (approximate)  I have reviewed the triage vital signs and the nursing notes.   HISTORY  Chief Complaint Chest Pain and Abdominal Pain    HPI Kimberly Scott is a 85 y.o. female with history of bypass surgery, Parkinson's who presents with chest pain and abdominal pain.  Patient said that she ate her vitamin C capsule.  It went down easy.   when a few minutes later she developed sudden onset severe pain in her right breast and in her abdomen area.  The pain is been constant, has not taken anything for it, nothing makes it worse.  Patient has had 2 episodes of vomiting one with EMS and one witnessed by the nurses that was nonbloody nonbilious.  She denies any urinary symptoms, cough, shortness of breath.  She does have some old bruising on her face that she says is from a fall 2 weeks ago.  Denies any current headaches.          Past Medical History:  Diagnosis Date  . Anginal pain ([employer])   . Arthritis   . Cancer (HCC)    FACIAL SKIN CANCER  . Coronary artery disease   . Headache(784.0)   . Heart murmur   . Hypertension   . Pneumonia    hx of PNA    Patient Active Problem List   Diagnosis Date Noted  . Hyponatremia 05/21/2018  . TIA (transient ischemic attack)   . Acute encephalopathy 05/20/2018  . Coronary arteriosclerosis 09/28/2012  . Hypertension 09/28/2012  . Hyperlipidemia 09/28/2012  . GERD (gastroesophageal reflux disease) 09/28/2012  . Breast cyst 09/28/2012    Past Surgical History:  Procedure Laterality Date  . ABDOMINAL HYSTERECTOMY    . APPENDECTOMY    . cataracts    . CORONARY ARTERY BYPASS GRAFT  10/01/2012   Procedure: CORONARY ARTERY BYPASS GRAFTING (CABG);  Surgeon: Grace Isaac, MD;  Location: [place];  Service: Open Heart Surgery;  Laterality: N/A;  times five  using Left Internal Mammary Artery   . ENDOVEIN HARVEST OF GREATER SAPHENOUS VEIN  10/01/2012   Procedure: ENDOVEIN HARVEST OF GREATER SAPHENOUS VEIN;  Surgeon: Grace Isaac, MD;  Location: [place];  Service: Open Heart Surgery;  Laterality: Bilateral;  . KNEE SURGERY    . TONSILLECTOMY    . TOTAL KNEE ARTHROPLASTY     right knee    Prior to Admission medications   Medication Sig Start Date End Date Taking? Authorizing Provider  ascorbic acid (VITAMIN C) 500 MG tablet Take 1,000 mg by mouth daily.    [provider]  aspirin EC 81 MG tablet Take 81 mg by mouth daily.    [provider]  atorvastatin (LIPITOR) 40 MG tablet Take 1 tablet (40 mg total) by mouth daily at 6 PM. 05/23/18   Leslye Peer, Richard, MD  benazepril (LOTENSIN) 20 MG tablet Take 20 mg by mouth daily.    [provider]  calcium carbonate (TUMS - DOSED IN MG ELEMENTAL CALCIUM) 500 MG chewable tablet Chew 1 tablet by mouth daily as needed for heartburn.     [provider]  carbidopa-levodopa (SINEMET IR) 25-100 MG tablet Take 1 tablet by mouth 3 (three) times daily.    [provider]  cholecalciferol (VITAMIN D) 1000 units tablet Take 1,000 Units by mouth daily.    [provider]  clopidogrel (PLAVIX) 75 MG tablet Take 1 tablet (75 mg total) by mouth daily. 05/23/18   Wieting, Richard, MD  Cyanocobalamin (B-12 IJ) Inject 1 Dose as directed every 30 (thirty) days.    [provider]  feeding supplement, ENSURE ENLIVE, (ENSURE ENLIVE) LIQD Take 237 mLs by mouth 2 (two) times daily between meals. 05/23/18   Loletha Grayer, MD  triamcinolone ointment (KENALOG) 0.1 % Apply 1 application topically 2 (two) times daily as needed.    [provider]    Allergies Omega 3  [fish oil], Neosporin [neomycin-bacitracin zn-polymyx], and Tape  No family history on file.  Social History Social History   Tobacco Use  . Smoking status: Never Smoker  . Smokeless  tobacco: Never Used  Substance Use Topics  . Alcohol use: No  . Drug use: No      Review of Systems Constitutional: No fever/chills Eyes: No visual changes. ENT: No sore throat. Cardiovascular: Positive chest pain Respiratory: Denies shortness of breath. Gastrointestinal: Positive abdominal pain no nausea, no vomiting.  No diarrhea.  No constipation. Genitourinary: Negative for dysuria. + increase frequency.  Musculoskeletal: Negative for back pain. Skin: Negative for rash. Neurological: Negative for headaches, focal weakness or numbness. All other ROS negative ____________________________________________   PHYSICAL EXAM:  VITAL SIGNS: ED Triage Vitals [07/30/19 2357]  Enc Vitals Group     BP (!) 160/67     Pulse Rate 69     Resp 20     Temp 97.8 F (36.6 C)     Temp Source Oral     SpO2 98 %     Weight      Height      Head Circumference      Peak Flow      Pain Score      Pain Loc      Pain Edu?      Excl. in [place]?     Constitutional: Alert and oriented. Well appearing and in no acute distress. Eyes: Conjunctivae are normal. EOMI. Head: Atraumatic. Old Bruising on face  Nose: No congestion/rhinnorhea. Mouth/Throat: Mucous membranes are moist.   Neck: No stridor. Trachea Midline. FROM Cardiovascular: Normal rate, regular rhythm. Grossly normal heart sounds.  Good peripheral circulation. + R breast pain, no redness on skin.  Respiratory: Normal respiratory effort.  No retractions. Lungs CTAB. Gastrointestinal:tender in upper abdomen. No distention. No abdominal bruits.  Musculoskeletal: No lower extremity tenderness nor edema.  No joint effusions. Neurologic:  Normal speech and language. No gross focal neurologic deficits are appreciated.  Skin:  Skin is warm, dry and intact. No rash noted. Psychiatric: Mood and affect are normal. Speech and behavior are normal. GU: Deferred   ____________________________________________   LABS (all labs ordered are  listed, but only abnormal results are displayed)  Labs Reviewed  CBC WITH DIFFERENTIAL/PLATELET - Abnormal; Notable for the following components:      Result Value   WBC 11.8 (*)    RBC 3.86 (*)    Neutro Abs 8.3 (*)    Monocytes Absolute 1.4 (*)    All other components within normal limits  COMPREHENSIVE METABOLIC PANEL - Abnormal; Notable for the following components:   Sodium 131 (*)    Chloride 96 (*)    Glucose, Bld 136 (*)    Creatinine, Ser 1.04 (*)    AST 69 (*)    GFR calc non Af Amer 48 (*)    GFR calc Af Amer 56 (*)    All other components  within normal limits  URINALYSIS, ROUTINE W REFLEX MICROSCOPIC - Abnormal; Notable for the following components:   Color, Urine YELLOW (*)    APPearance HAZY (*)    Glucose, UA 50 (*)    Nitrite POSITIVE (*)    Leukocytes,Ua MODERATE (*)    Bacteria, UA RARE (*)    All other components within normal limits  LIPASE, BLOOD  MAGNESIUM  TROPONIN I (HIGH SENSITIVITY)  TROPONIN I (HIGH SENSITIVITY)   ____________________________________________   ED ECG REPORT I, Vanessa Webster, the attending physician, personally viewed and interpreted this ECG.  EKG is sinus rate of 76, no ST elevation, T wave inversion in aVL, PVC, normal intervals.  T wave inversion similar to prior EKG. ____________________________________________  RADIOLOGY   Official radiology report(s): Ct Angio Chest/abd/pel For Dissection W And/or Wo Contrast  Result Date: 07/31/2019 CLINICAL DATA:  Sudden onset of severe chest and abdominal pain. Vomiting. Patient reports right chest/breast pain. EXAM: CT ANGIOGRAPHY CHEST, ABDOMEN AND PELVIS TECHNIQUE: Multidetector CT imaging through the chest, abdomen and pelvis was performed using the standard protocol during bolus administration of intravenous contrast. Multiplanar reconstructed images and MIPs were obtained and reviewed to evaluate the vascular anatomy. CONTRAST:  183mL OMNIPAQUE IOHEXOL 350 MG/ML SOLN COMPARISON:   No recent imaging. Most recent chest x-ray 05/21/2018. Abdominal CT 06/03/2013 FINDINGS: CTA CHEST FINDINGS Cardiovascular: Aortic atherosclerosis and tortuosity without dissection, aneurysm, acute aortic syndrome or hematoma. Conventional branching pattern from the aortic arch. Branch vessels are patent. Central pulmonary arteries are patent to the segmental level. Prior CABG with calcification of native coronary arteries. Mild cardiomegaly with primarily atrial enlargement contrast refluxing into the hepatic veins and IVC. No pericardial effusion. Mediastinum/Nodes: No enlarged mediastinal or hilar lymph nodes. Decompressed esophagus with small hiatal hernia. No visualized thyroid nodule. Lungs/Pleura: Minimal subpleural atelectasis in the lower lobes. No focal airspace disease. No pulmonary edema. No pleural effusion. Calcified granulomas in the right lower lobe. 2 mm noncalcified pulmonary nodule in the right upper lobe series 7, image 71. Pulmonary nodules are similar to 06/23/2009 chest CT and considered benign. Musculoskeletal: Median sternotomy. There are no acute or suspicious osseous abnormalities. Bone island in the midthoracic spine. Review of the MIP images confirms the above findings. CTA ABDOMEN AND PELVIS FINDINGS VASCULAR Aorta: Normal in caliber without atherosclerotic calcifications. No aneurysm, dissection, or vasculitis. Celiac: Separate origin of the hepatic and splenic arteries from the aorta. Early branching of the right and left hepatic arteries. Atherosclerosis at the origin. SMA: Atherosclerosis of the origin. Branch vessels are patent. No acute findings. Renals: Both renal arteries are patent. No dissection or aneurysm. Tiny assess for E renal artery to the right lower pole. IMA: Patent without evidence of aneurysm, dissection, vasculitis or significant stenosis. Inflow: Atherosclerotic calcifications without aneurysm, dissection, vasculitis or severe stenosis. Veins: No obvious venous  abnormality within the limitations of this arterial phase study. Review of the MIP images confirms the above findings. NON-VASCULAR Hepatobiliary: Calcified granuloma. No obvious focal hepatic abnormality allowing for arterial phase imaging. Gallbladder is distended. No pericholecystic inflammation. No calcified gallstone. No biliary dilatation. Pancreas: No ductal dilatation or inflammation. Spleen: Normal in size and arterial enhancement. Adrenals/Urinary Tract: Normal adrenal glands. Similar similar appearance of bilateral renal pelvis and ureteral prominence from prior exam. No obstructing calculi. No perinephric edema. Physiologically distended bladder without wall thickening. Stomach/Bowel: Small hiatal hernia. Ingested material in the stomach. No evidence of gastric wall thickening. No small bowel obstruction or inflammatory change. The appendix is not confidently  visualized. Moderate stool burden in the colon with colonic tortuosity. No colonic wall thickening or inflammatory change. Diverticulosis of the distal colon without diverticulitis. Stool distends the rectum. Lymphatic: No enlarged lymph nodes in the abdomen or pelvis. Punctate calcified mesenteric nodes consistent with prior granulomatous disease. Reproductive: Status post hysterectomy. No adnexal masses. Other: No free air, free fluid, or intra-abdominal fluid collection. Musculoskeletal: There are no acute or suspicious osseous abnormalities. Review of the MIP images confirms the above findings. IMPRESSION: 1. No aortic dissection or acute aortic abnormality. Moderate thoracoabdominal aortic atherosclerosis. 2. Cardiomegaly with contrast refluxing into the hepatic veins and IVC consistent with elevated right heart pressures. 3. Chronic finding in the chest include small pulmonary nodules there are stable dating back to 2010 and considered benign. 4. Chronic findings in the abdomen and pelvis include colonic diverticulosis and small hiatal hernia.  Aortic Atherosclerosis (ICD10-I70.0). Electronically Signed   By: Keith Rake M.D.   On: 07/31/2019 02:35    ____________________________________________   PROCEDURES  Procedure(s) performed (including Critical Care):  Procedures   ____________________________________________   INITIAL IMPRESSION / ASSESSMENT AND PLAN / ED COURSE   AZUMI ISAKSON was evaluated in Emergency Department on 07/31/2019 for the symptoms described in the history of present illness. She was evaluated in the context of the global COVID-19 pandemic, which necessitated consideration that the patient might be at risk for infection with the SARS-CoV-2 virus that causes COVID-19. Institutional protocols and algorithms that pertain to the evaluation of patients at risk for COVID-19 are in a state of rapid change based on information released by regulatory bodies including the CDC and federal and state organizations. These policies and algorithms were followed during the patient's care in the ED.    Patient is a 85 year old who presents with abdominal pain and chest pain at the site of vomiting.  Will get CT dissection to rule out dissection.  Will also evaluate for obstruction, cholecystitis, pancreatitis.  Will get labs to evaluate for ACS.  Will get urine to evaluate for UTI given the increased frequency.  No shortness of breath to suggest PE.  Labs are reassuring.  Troponin was elevated at 11 x2  Urine is consistent with UTI.  CT scan does not show evidence of dissection.   lengthy discussion with patient and daughter at bedside.  Patient lives alone.  Given the vomiting that occurred with EMS as well as [place] family felt comfortable having patient come to the hospital for IV antibiotics with the hope of being able to be discharged tomorrow if she continues to not have any additional vomiting.  Unclear what was the cause of the pain.  However pain seems to be resolved at this  time.  ____________________________________________   FINAL CLINICAL IMPRESSION(S) / ED DIAGNOSES   Final diagnoses:  Acute cystitis without hematuria  Vomiting, intractability of vomiting not specified, presence of nausea not specified, unspecified vomiting type     MEDICATIONS GIVEN DURING THIS VISIT:  Medications  cefTRIAXone (ROCEPHIN) 1 g in sodium chloride 0.9 % 100 mL IVPB (has no administration in time range)  ondansetron (ZOFRAN) injection 4 mg (4 mg Intravenous Given 07/31/19 0038)  fentaNYL (SUBLIMAZE) injection 25 mcg (25 mcg Intravenous Given 07/31/19 0038)  iohexol (OMNIPAQUE) 350 MG/ML injection 125 mL (125 mLs Intravenous Contrast Given 07/31/19 0127)  HYDROmorphone (DILAUDID) injection 0.5 mg (0.5 mg Intravenous Given 07/31/19 0201)  ondansetron (ZOFRAN) injection 4 mg (4 mg Intravenous Given 07/31/19 0159)     ED Discharge Orders  None       Note:  This document was prepared using Dragon voice recognition software and may include unintentional dictation errors.   Vanessa [place], MD 07/31/19 [phone]

## 2019-07-31 NOTE — Evaluation (Signed)
Physical Therapy Evaluation Patient Details Name: Kimberly Scott MRN: [id number] DOB: 04/23/1928 Today's Date: 07/31/2019   History of Present Illness  Patient is 85 yo female that presented to ED for chest pain, nausea/vomiting. Work up showed UTI, Cardiac enzymes negative. PMH of facial skin cancer, parkinson's, HA, HTN, TIA, pt also reported a recent fall, bruising to face and knees noted.    Clinical Impression  Pt up in bed, A&Ox4, HOH very pleasant, verbose. Denied pain initially, did state her knees are sore from her recent fall. Pt stated she lives alone, uses a [facility] for ambulation, mod I for ADLs, drives, eats out for almost all meals, daughter cleans her house for her. Pt stated she falls 1-2 times a month.   Upon assessment, pt demonstrated bed mobility mod I, able to sit EOB with good balance, MMT testing revealed mild deficits in LE strength. The patient was able to ambulate with SPC, needed several standing rest breaks but no overt LOB noted. Pt able to maintain static standing balance without UE support but needed UE support for any mobility.  Overall the patient demonstrated mild deficits (see "PT Problem List") that impede the patient's functional abilities, safety, and mobility and would benefit from skilled PT intervention. Recommendation is outpatient PT to address patient's repetitive falls and to maximize safety.      Follow Up Recommendations Outpatient PT    Equipment Recommendations  None recommended by PT(Pt has extensive AD at home)    Recommendations for Other Services       Precautions / Restrictions Precautions Precautions: Fall Restrictions Weight Bearing Restrictions: No      Mobility  Bed Mobility Overal bed mobility: Modified Independent                Transfers Overall transfer level: Needs assistance Equipment used: 1 person hand held assist(IV pole) Transfers: Sit to/from Stand Sit to Stand: Supervision             Ambulation/Gait   Gait Distance (Feet): 190 Feet Assistive device: IV Pole;1 person hand held assist   Gait velocity: decreased   General Gait Details: Patient with significant kyphosis with ambulation, dependent on UE support, 2-3 standing rest breaks but no overt LOB noted.  Stairs            Wheelchair Mobility    Modified Rankin (Stroke Patients Only)       Balance Overall balance assessment: Needs assistance Sitting-balance support: Feet supported Sitting balance-Leahy Scale: Good       Standing balance-Leahy Scale: Fair Standing balance comment: Pt able to maintain balance in static standing, needs UE support for ambulation                             Pertinent Vitals/Pain Pain Assessment: No/denies pain    Home Living Family/patient expects to be discharged to:: Private residence Living Arrangements: Alone Available Help at Discharge: Family;Available 24 hours/day(daughter lives very close by) Type of Home: House Home Access: Stairs to enter Entrance Stairs-Rails: Right Entrance Stairs-Number of Steps: 1-2 Home Layout: One level Home Equipment: Walker - 2 wheels;Cane - single point;Shower seat;Grab bars - tub/shower;Walker - 4 wheels      Prior Function Level of Independence: Independent with assistive device(s)         Comments: Pt uses SPC most often for ambulation. Does drive, goes out for most meals, daughter cleans for her. Pt stated she falls 1-2 times a month  Hand Dominance   Dominant Hand: Right    Extremity/Trunk Assessment   Upper Extremity Assessment Upper Extremity Assessment: Overall WFL for tasks assessed    Lower Extremity Assessment Lower Extremity Assessment: RLE deficits/detail;LLE deficits/detail RLE Deficits / Details: grossly 4/5 LLE Deficits / Details: grossly 4/5    Cervical / Trunk Assessment Cervical / Trunk Assessment: Kyphotic  Communication   Communication: HOH  Cognition  Arousal/Alertness: Awake/alert Behavior During Therapy: WFL for tasks assessed/performed Overall Cognitive Status: Within Functional Limits for tasks assessed                                        General Comments      Exercises     Assessment/Plan    PT Assessment Patient needs continued PT services  PT Problem List Decreased mobility;Decreased safety awareness;Decreased balance;Decreased activity tolerance       PT Treatment Interventions DME instruction;Therapeutic exercise;Gait training;Balance training;Functional mobility training;Therapeutic activities;Patient/family education;Neuromuscular re-education;Stair training    PT Goals (Current goals can be found in the Care Plan section)  Acute Rehab PT Goals Patient Stated Goal: to go home PT Goal Formulation: With patient Time For Goal Achievement: 08/14/19 Potential to Achieve Goals: Good    Frequency Min 2X/week   Barriers to discharge        Co-evaluation               AM-PAC PT "6 Clicks" Mobility  Outcome Measure Help needed turning from your back to your side while in a flat bed without using bedrails?: None Help needed moving from lying on your back to sitting on the side of a flat bed without using bedrails?: None Help needed moving to and from a bed to a chair (including a wheelchair)?: A Little Help needed standing up from a chair using your arms (e.g., wheelchair or bedside chair)?: A Little Help needed to walk in hospital room?: A Little Help needed climbing 3-5 steps with a railing? : A Little 6 Click Score: 20    End of Session Equipment Utilized During Treatment: Gait belt Activity Tolerance: Patient tolerated treatment well Patient left: in bed;with call bell/phone within reach;with bed alarm set Nurse Communication: Mobility status PT Visit Diagnosis: Muscle weakness (generalized) (M62.81);History of falling (Z91.81);Other abnormalities of gait and mobility (R26.89)     Time: [id number] PT Time Calculation (min) (ACUTE ONLY): 34 min   Charges:   PT Evaluation $PT Eval Low Complexity: 1 Low PT Treatments $Therapeutic Exercise: 8-22 mins      Lieutenant Diego PT, DPT 3:17 PM,07/31/19 [phone]

## 2019-07-31 NOTE — ED Notes (Signed)
Patient declines breakfast tray at this time; provided cup of coffee upon request.

## 2019-07-31 NOTE — Progress Notes (Signed)
Patient ID: Kimberly Scott, female   DOB: 06-01-1928, 85 y.o.   MRN: [id number]  [facility] PROGRESS NOTE  KEEGHAN ANHORN [id number] DOB: [birth year] DOA: 07/30/2019 PCP: Adin Hector, MD  HPI/Subjective: Patient states that she got choked on a calcium pill and had some nausea vomiting.  Daughter states that she had chest pain.  The patient states that she had a fall and she states that sometimes that happens with Parkinson's patients.  She states that her knees and elbow got bruised up a little bit.  She states that she lives alone and usually uses a cane to walk.  She was diagnosed with a urinary infection in the emergency room.  Patient ate some lunch.  Objective: Vitals:   07/31/19 0638 07/31/19 0900  BP: (!) 165/66 (!) 138/99  Pulse:    Resp: 15 17  Temp:    SpO2: 97% 95%   No intake or output data in the 24 hours ending 07/31/19 1256 Filed Weights   07/31/19 0018  Weight: 78.5 kg    ROS: Review of Systems  Constitutional: Negative for chills and fever.  Eyes: Negative for blurred vision.  Respiratory: Negative for cough, shortness of breath and wheezing.   Cardiovascular: Negative for chest pain.  Gastrointestinal: Negative for abdominal pain, constipation, diarrhea, nausea and vomiting.  Genitourinary: Negative for dysuria.  Musculoskeletal: Positive for joint pain.  Neurological: Negative for dizziness and headaches.   Exam: Physical Exam  HENT:  Nose: No mucosal edema.  Mouth/Throat: No oropharyngeal exudate or posterior oropharyngeal edema.  Eyes: Pupils are equal, round, and reactive to light. Conjunctivae, EOM and lids are normal.  Neck: No JVD present. Carotid bruit is not present. No edema present. No thyroid mass and no thyromegaly present.  Cardiovascular: S1 normal and S2 normal. Exam reveals no gallop.  No murmur heard. Pulses:      Dorsalis pedis pulses are 2+ on the right side and 2+ on the left side.  Respiratory: No respiratory  distress. She has no wheezes. She has no rhonchi. She has no rales.  GI: Soft. Bowel sounds are normal. There is no abdominal tenderness.  Musculoskeletal:     Right ankle: She exhibits swelling.     Left ankle: She exhibits swelling.  Lymphadenopathy:    She has no cervical adenopathy.  Neurological: She is alert. No cranial nerve deficit.  Skin: Skin is warm. No rash noted. Nails show no clubbing.  Bruising around right eye.  Bruising bilateral knees right greater than left.  Psychiatric: She has a normal mood and affect.      Data Reviewed: Basic Metabolic Panel: Recent Labs  Lab 07/31/19 0033 07/31/19 0600  NA 131* 132*  K 3.8 4.2  CL 96* 97*  CO2 25 25  GLUCOSE 136* 139*  BUN 14 12  CREATININE 1.04* 1.00  CALCIUM 9.0 9.0  MG 1.9  --    Liver Function Tests: Recent Labs  Lab 07/31/19 0033  AST 69*  ALT 23  ALKPHOS 78  BILITOT 0.9  PROT 7.0  ALBUMIN 4.1   Recent Labs  Lab 07/31/19 0033  LIPASE 34   CBC: Recent Labs  Lab 07/31/19 0033 07/31/19 0600  WBC 11.8* 8.7  NEUTROABS 8.3*  --   HGB 12.1 11.8*  HCT 36.2 35.2*  MCV 93.8 93.9  PLT 218 221     Studies: Ct Angio Chest/abd/pel For Dissection W And/or Wo Contrast  Result Date: 07/31/2019 CLINICAL DATA:  Sudden onset of severe  chest and abdominal pain. Vomiting. Patient reports right chest/breast pain. EXAM: CT ANGIOGRAPHY CHEST, ABDOMEN AND PELVIS TECHNIQUE: Multidetector CT imaging through the chest, abdomen and pelvis was performed using the standard protocol during bolus administration of intravenous contrast. Multiplanar reconstructed images and MIPs were obtained and reviewed to evaluate the vascular anatomy. CONTRAST:  182mL OMNIPAQUE IOHEXOL 350 MG/ML SOLN COMPARISON:  No recent imaging. Most recent chest x-ray 05/21/2018. Abdominal CT 06/03/2013 FINDINGS: CTA CHEST FINDINGS Cardiovascular: Aortic atherosclerosis and tortuosity without dissection, aneurysm, acute aortic syndrome or hematoma.  Conventional branching pattern from the aortic arch. Branch vessels are patent. Central pulmonary arteries are patent to the segmental level. Prior CABG with calcification of native coronary arteries. Mild cardiomegaly with primarily atrial enlargement contrast refluxing into the hepatic veins and IVC. No pericardial effusion. Mediastinum/Nodes: No enlarged mediastinal or hilar lymph nodes. Decompressed esophagus with small hiatal hernia. No visualized thyroid nodule. Lungs/Pleura: Minimal subpleural atelectasis in the lower lobes. No focal airspace disease. No pulmonary edema. No pleural effusion. Calcified granulomas in the right lower lobe. 2 mm noncalcified pulmonary nodule in the right upper lobe series 7, image 71. Pulmonary nodules are similar to 06/23/2009 chest CT and considered benign. Musculoskeletal: Median sternotomy. There are no acute or suspicious osseous abnormalities. Bone island in the midthoracic spine. Review of the MIP images confirms the above findings. CTA ABDOMEN AND PELVIS FINDINGS VASCULAR Aorta: Normal in caliber without atherosclerotic calcifications. No aneurysm, dissection, or vasculitis. Celiac: Separate origin of the hepatic and splenic arteries from the aorta. Early branching of the right and left hepatic arteries. Atherosclerosis at the origin. SMA: Atherosclerosis of the origin. Branch vessels are patent. No acute findings. Renals: Both renal arteries are patent. No dissection or aneurysm. Tiny assess for E renal artery to the right lower pole. IMA: Patent without evidence of aneurysm, dissection, vasculitis or significant stenosis. Inflow: Atherosclerotic calcifications without aneurysm, dissection, vasculitis or severe stenosis. Veins: No obvious venous abnormality within the limitations of this arterial phase study. Review of the MIP images confirms the above findings. NON-VASCULAR Hepatobiliary: Calcified granuloma. No obvious focal hepatic abnormality allowing for arterial  phase imaging. Gallbladder is distended. No pericholecystic inflammation. No calcified gallstone. No biliary dilatation. Pancreas: No ductal dilatation or inflammation. Spleen: Normal in size and arterial enhancement. Adrenals/Urinary Tract: Normal adrenal glands. Similar similar appearance of bilateral renal pelvis and ureteral prominence from prior exam. No obstructing calculi. No perinephric edema. Physiologically distended bladder without wall thickening. Stomach/Bowel: Small hiatal hernia. Ingested material in the stomach. No evidence of gastric wall thickening. No small bowel obstruction or inflammatory change. The appendix is not confidently visualized. Moderate stool burden in the colon with colonic tortuosity. No colonic wall thickening or inflammatory change. Diverticulosis of the distal colon without diverticulitis. Stool distends the rectum. Lymphatic: No enlarged lymph nodes in the abdomen or pelvis. Punctate calcified mesenteric nodes consistent with prior granulomatous disease. Reproductive: Status post hysterectomy. No adnexal masses. Other: No free air, free fluid, or intra-abdominal fluid collection. Musculoskeletal: There are no acute or suspicious osseous abnormalities. Review of the MIP images confirms the above findings. IMPRESSION: 1. No aortic dissection or acute aortic abnormality. Moderate thoracoabdominal aortic atherosclerosis. 2. Cardiomegaly with contrast refluxing into the hepatic veins and IVC consistent with elevated right heart pressures. 3. Chronic finding in the chest include small pulmonary nodules there are stable dating back to 2010 and considered benign. 4. Chronic findings in the abdomen and pelvis include colonic diverticulosis and small hiatal hernia. Aortic Atherosclerosis (ICD10-I70.0). Electronically Signed  By: Keith Rake M.D.   On: 07/31/2019 02:35    Scheduled Meds: . aspirin EC  81 mg Oral Daily  . atorvastatin  40 mg Oral q1800  . benazepril  20 mg Oral  Daily  . carbidopa-levodopa  1 tablet Oral BID  . carbidopa-levodopa  2 tablet Oral Q breakfast  . cholecalciferol  1,000 Units Oral Daily  . clopidogrel  75 mg Oral Daily  . [START ON 08/29/2019] cyanocobalamin  1,000 mcg Intramuscular Q30 days  . enoxaparin (LOVENOX) injection  40 mg Subcutaneous QHS  . feeding supplement (ENSURE ENLIVE)  237 mL Oral BID BM  . ascorbic acid  1,000 mg Oral Daily   Continuous Infusions: . sodium chloride 100 mL/hr at 07/31/19 0354  . [START ON 08/01/2019] cefTRIAXone (ROCEPHIN)  IV      Assessment/Plan:  1. Nausea and vomiting.  Patient states it was secondary to the calcium pill that she tried to swallow.  Patient was able to tolerate diet.  Daughter concerned about the patient's gallbladder.  We will get a right upper quadrant ultrasound tomorrow morning. 2. Chest pain likely with the nausea vomiting and the pill getting stuck.  Cardiac enzymes x2 were negative this goes against cardiac etiology of cause of the chest pain.  CT Angie of the chest showed no dissection. 3. Acute cystitis.  On Rocephin.  Follow-up urine culture. 4. Weakness and falls.  Physical therapy evaluation 5. Parkinson's disease continue Sinemet 6. History of TIA on aspirin and Plavix 7. Hyperlipidemia on atorvastatin 8. Hypertension on benazepril  Code Status:     Code Status Orders  (From admission, onward)         Start     Ordered   07/31/19 0333  Full code  Continuous     07/31/19 0335        Code Status History    Date Active Date Inactive Code Status Order ID Comments User Context   05/21/2018 0106 05/23/2018 1456 Full Code [id number]  Amelia Jo, MD Inpatient   10/03/2012 0856 10/07/2012 1430 Full Code [id number]  Grace Isaac, MD Inpatient   10/01/2012 1347 10/03/2012 0856 Full Code [id number]  Laqueta Carina, RN Inpatient   09/28/2012 1518 10/01/2012 1347 Full Code [id number]  John Giovanni, PA Inpatient   Advance Care Planning Activity    Advance  Directive Documentation     Most Recent Value  Type of Advance Directive  Healthcare Power of Attorney, Living will  Pre-existing out of facility DNR order (yellow form or pink MOST form)  -  "MOST" Form in Place?  -     Family Communication: Spoke with daughter on the phone Disposition Plan: Potential disposition tomorrow  Antibiotics:  Rocephin  Time spent: 36 minutes  [place]

## 2019-08-01 ENCOUNTER — Observation Stay: Payer: Medicare Other

## 2019-08-01 LAB — TROPONIN I (HIGH SENSITIVITY)
Troponin I (High Sensitivity): 16 ng/L (ref <18)
Troponin I (High Sensitivity): 12 ng/L (ref <18)

## 2019-08-01 MED ORDER — CALCIUM CARBONATE ANTACID 500 MG PO CHEW: 1.0000 | CHEWABLE_TABLET | Freq: Three times a day (TID) | ORAL | 0 refills | Status: DC

## 2019-08-01 MED ORDER — CEPHALEXIN 250 MG PO CAPS: 250.0000 mg | ORAL_CAPSULE | Freq: Three times a day (TID) | ORAL | 0 refills | Status: AC

## 2019-08-01 MED ORDER — ONDANSETRON HCL 4 MG PO TABS: 4.0000 mg | ORAL_TABLET | Freq: Four times a day (QID) | ORAL | 0 refills | Status: DC | PRN

## 2019-08-01 MED ORDER — ENSURE ENLIVE PO LIQD: 237.0000 mL | Freq: Two times a day (BID) | ORAL | 0 refills | Status: DC

## 2019-08-01 NOTE — Care Management Obs Status (Signed)
[place] NOTIFICATION   Patient Details  Name: Kimberly Scott MRN: [id number] Date of Birth: 11-17-1928   Medicare Observation Status Notification Given:  Yes    Shelbie Hutching, RN 08/01/2019, 10:40 AM

## 2019-08-01 NOTE — Discharge Instructions (Signed)
Nausea and Vomiting, Adult °Nausea is feeling sick to your stomach or feeling that you are about to throw up (vomit). Vomiting is when food in your stomach is thrown up and out of the mouth. Throwing up can make you feel weak. It can also make you lose too much water in your body (get dehydrated). If you lose too much water in your body, you may: °· Feel tired. °· Feel thirsty. °· Have a dry mouth. °· Have cracked lips. °· Go pee (urinate) less often. °Older adults and people with other diseases or a weak body defense system (immune system) are at higher risk for losing too much water in the body. If you feel sick to your stomach and you throw up, it is important to follow instructions from your doctor about how to take care of yourself. °Follow these instructions at home: °Watch your symptoms for any changes. Tell your doctor about them. Follow these instructions to care for yourself at home. °Eating and drinking ° °  ° °· Take an ORS (oral rehydration solution). This is a drink that is sold at pharmacies and stores. °· Drink clear fluids in small amounts as you are able, such as: °? Water. °? Ice chips. °? Fruit juice that has water added (diluted fruit juice). °? Low-calorie sports drinks. °· Eat bland, easy-to-digest foods in small amounts as you are able, such as: °? Bananas. °? Applesauce. °? Rice. °? Low-fat (lean) meats. °? Toast. °? Crackers. °· Avoid drinking fluids that have a lot of sugar or caffeine in them. This includes energy drinks, sports drinks, and soda. °· Avoid alcohol. °· Avoid spicy or fatty foods. °General instructions °· Take over-the-counter and prescription medicines only as told by your doctor. °· Drink enough fluid to keep your pee (urine) pale yellow. °· Wash your hands often with soap and water. If you cannot use soap and water, use hand sanitizer. °· Make sure that all people in your home wash their hands well and often. °· Rest at home while you get better. °· Watch your condition  for any changes. °· Take slow and deep breaths when you feel sick to your stomach. °· Keep all follow-up visits as told by your doctor. This is important. °Contact a doctor if: °· Your symptoms get worse. °· You have new symptoms. °· You have a fever. °· You cannot drink fluids without throwing up. °· You feel sick to your stomach for more than 2 days. °· You feel light-headed or dizzy. °· You have a headache. °· You have muscle cramps. °· You have a rash. °· You have pain while peeing. °Get help right away if: °· You have pain in your chest, neck, arm, or jaw. °· You feel very weak or you pass out (faint). °· You throw up again and again. °· You have throw up that is bright red or looks like black coffee grounds. °· You have bloody or black poop (stools) or poop that looks like tar. °· You have a very bad headache, a stiff neck, or both. °· You have very bad pain, cramping, or bloating in your belly (abdomen). °· You have trouble breathing. °· You are breathing very quickly. °· Your heart is beating very quickly. °· Your skin feels cold and clammy. °· You feel confused. °· You have signs of losing too much water in your body, such as: °? Dark pee, very little pee, or no pee. °? Cracked lips. °? Dry mouth. °? Sunken eyes. °?   Sleepiness. °? Weakness. °These symptoms may be an emergency. Do not wait to see if the symptoms will go away. Get medical help right away. Call your local emergency services (911 in the [place] Do not drive yourself to the hospital. °Summary °· Nausea is feeling sick to your stomach or feeling that you are about to throw up (vomit). Vomiting is when food in your stomach is thrown up and out of the mouth. °· Follow instructions from your doctor about eating and drinking to keep from losing too much water in your body. °· Take over-the-counter and prescription medicines only as told by your doctor. °· Contact your doctor if your symptoms get worse or you have new symptoms. °· Keep all follow-up  visits as told by your doctor. This is important. °This information is not intended to replace advice given to you by your health care provider. Make sure you discuss any questions you have with your health care provider. °Document Released: 05/02/2008 Document Revised: 03/08/2019 Document Reviewed: 04/24/2018 °Elsevier Patient Education © 2020 [employer] ° °

## 2019-08-01 NOTE — TOC Initial Note (Signed)
Transition of Care [facility]) - Initial/Assessment Note    Patient Details  Name: Kimberly Scott MRN: [id number] Date of Birth: 03/24/1928  [facility] CM/SW Contact:    Shelbie Hutching, RN Phone Number: 08/01/2019, 10:34 AM  Clinical Narrative:                 Patient admitted for UTI and nausea and vomiting then changed to observation.  Patient will be discharged home today.  Patient lives alone, daughter is at the bedside.  Patient reports that she has Med Alert personal emergency response system.  PT has recommended outpatient PT and patient may be interested.  Referral faxed to [facility] outpatient rehab.  Daughter will provide transportation at discharge.   Expected Discharge Plan: Home/Self Care Barriers to Discharge: Barriers Resolved   Patient Goals and CMS Choice Patient states their goals for this hospitalization and ongoing recovery are:: Ready to go home      Expected Discharge Plan and Services Expected Discharge Plan: Home/Self Care   Discharge Planning Services: CM Consult   Living arrangements for the past 2 months: Single Family Home Expected Discharge Date: 08/01/19                                    Prior Living Arrangements/Services Living arrangements for the past 2 months: Single Family Home Lives with:: Self Patient language and need for interpreter reviewed:: No Do you feel safe going back to the place where you live?: Yes      Need for Family Participation in Patient Care: Yes (Comment) Care giver support system in place?: Yes (comment)(daughter)   Criminal Activity/Legal Involvement Pertinent to Current Situation/Hospitalization: No - Comment as needed  Activities of Daily Living Home Assistive Devices/Equipment: Cane (specify quad or straight), Walker (specify type) ADL Screening (condition at time of admission) Patient's cognitive ability adequate to safely complete daily activities?: Yes Is the patient deaf or have difficulty  hearing?: No Does the patient have difficulty seeing, even when wearing glasses/contacts?: No Does the patient have difficulty concentrating, remembering, or making decisions?: No Patient able to express need for assistance with ADLs?: Yes Does the patient have difficulty dressing or bathing?: No Independently performs ADLs?: Yes (appropriate for developmental age) Does the patient have difficulty walking or climbing stairs?: Yes Weakness of Legs: None Weakness of Arms/Hands: None  Permission Sought/Granted Permission sought to share information with : Case Manager Permission granted to share information with : Yes, Verbal Permission Granted              Emotional Assessment Appearance:: Appears stated age Attitude/Demeanor/Rapport: Engaged Affect (typically observed): Accepting Orientation: : Oriented to Self, Oriented to Place, Oriented to  Time, Oriented to Situation Alcohol / Substance Use: Not Applicable Psych Involvement: No (comment)  Admission diagnosis:  Acute cystitis without hematuria [N30.00] Vomiting, intractability of vomiting not specified, presence of nausea not specified, unspecified vomiting type [R11.10] Patient Active Problem List   Diagnosis Date Noted  . UTI (urinary tract infection) 07/31/2019  . Hyponatremia 05/21/2018  . TIA (transient ischemic attack)   . Acute encephalopathy 05/20/2018  . Coronary arteriosclerosis 09/28/2012  . Hypertension 09/28/2012  . Hyperlipidemia 09/28/2012  . GERD (gastroesophageal reflux disease) 09/28/2012  . Breast cyst 09/28/2012   PCP:  Adin Hector, MD Pharmacy:   [facility] - [place], [address]  [place] [address] Phone: [phone] Fax: [phone]  [id number] - [place], [place] [address] [place] [address] Phone: [phone] Fax: [phone]     Social Determinants of Health (SDOH) Interventions     Readmission Risk Interventions No flowsheet data found.

## 2019-08-01 NOTE — Care Management CC44 (Signed)
Condition Code 44 Documentation Completed  Patient Details  Name: CARESA KAMPMAN MRN: [id number] Date of Birth: Dec 18, 1927   Condition Code 44 given:  Yes Patient signature on Condition Code 44 notice:  Yes Documentation of 2 MD's agreement:  Yes Code 44 added to claim:  Yes    Shelbie Hutching, RN 08/01/2019, 10:40 AM

## 2019-08-01 NOTE — Plan of Care (Signed)
  Problem: Urinary Elimination: Goal: Signs and symptoms of infection will decrease Outcome: Progressing   Problem: Education: Goal: Knowledge of General Education information will improve Description: Including pain rating scale, medication(s)/side effects and non-pharmacologic comfort measures Outcome: Progressing   Problem: Health Behavior/Discharge Planning: Goal: Ability to manage health-related needs will improve Outcome: Progressing   Problem: Clinical Measurements: Goal: Ability to maintain clinical measurements within normal limits will improve Outcome: Progressing Goal: Diagnostic test results will improve Outcome: Progressing   Problem: Activity: Goal: Risk for activity intolerance will decrease Outcome: Progressing

## 2019-08-01 NOTE — Progress Notes (Signed)
Pt discharged home at this time. Instructions given to pt and dtr. Meds/ diet / activity and f/u discussed.  Sl d/cd/  No voiced c/o.verbalized understanding of  plans

## 2019-08-01 NOTE — Discharge Summary (Signed)
[name] NAME: Kimberly Scott    MR#:  [id number]  DATE OF BIRTH:  06/11/1928  DATE OF ADMISSION:  07/30/2019 ADMITTING PHYSICIAN: Christel Mormon, MD  DATE OF DISCHARGE: 08/01/2019  PRIMARY CARE PHYSICIAN: Tama High III, MD    ADMISSION DIAGNOSIS:  Acute cystitis without hematuria [N30.00] Vomiting, intractability of vomiting not specified, presence of nausea not specified, unspecified vomiting type [R11.10]  DISCHARGE DIAGNOSIS:  Active Problems:   UTI (urinary tract infection)   SECONDARY DIAGNOSIS:   Past Medical History:  Diagnosis Date  . Anginal pain ([place])   . Arthritis   . Cancer (H[facility])    FACIAL SKIN CANCER  . Coronary artery disease   . Headache(784.0)   . Heart murmur   . Hypertension   . Pneumonia    hx of PNA    HOSPITAL COURSE:   1.  Nausea and vomiting.  The patient states that this was secondary to a calcium pill that she tried to swallow.  The patient able to tolerate diet here in the hospital.  Ultrasound of the gallbladder was negative. 2.  Chest pain with nausea vomiting and the pill getting stuck.  Cardiac enzymes x2 were negative.  This goes against any cardiac etiology of chest pain.  CT angiogram of the chest showed no dissection. 3.  Acute cystitis.  On Rocephin here in the hospital.  Follow-up urine culture so far E. coli growing.  Keflex upon discharge home for a few more days 4.  Weakness and falls.  Physical therapy recommended outpatient PT.  I did sign the referral but the patient may not do it. 3.  Parkinson's disease continue Sinemet 4.  History of TIA on aspirin and Plavix 5.  Hyperlipidemia unspecified on atorvastatin 6.  Hypertension.  Continue usual medications 7.  Pulmonary nodule seen on the CT scan which is stable from prior CAT scan for many years ago which goes in with a benign nodule.  DISCHARGE CONDITIONS:   Satisfactory  CONSULTS OBTAINED:  None  DRUG ALLERGIES:    Allergies  Allergen Reactions  . Bacitracin-Neomycin-Polymyxin Swelling and Rash    Blisters Blisters   . Omega 3  [Fish Oil] Other (See Comments)  . Neosporin [Neomycin-Bacitracin Zn-Polymyx] Rash  . Tape Rash    Blisters    DISCHARGE MEDICATIONS:   Allergies as of 08/01/2019      Reactions   Bacitracin-neomycin-polymyxin Swelling, Rash   Blisters Blisters   Omega 3  [fish Oil] Other (See Comments)   Neosporin [neomycin-bacitracin Zn-polymyx] Rash   Tape Rash   Blisters      Medication List    TAKE these medications   ascorbic acid 500 MG tablet Commonly known as: VITAMIN C Take 1,000 mg by mouth daily.   aspirin EC 81 MG tablet Take 81 mg by mouth daily.   atorvastatin 40 MG tablet Commonly known as: LIPITOR Take 1 tablet (40 mg total) by mouth daily at 6 PM.   azelastine 0.1 % nasal spray Commonly known as: ASTELIN Place 1 spray into both nostrils 2 (two) times daily. Use in each nostril as directed   B-12 IJ Inject 1 Dose as directed every 30 (thirty) days.   calcium carbonate 500 MG chewable tablet Commonly known as: TUMS - dosed in mg elemental calcium Chew 1 tablet (200 mg of elemental calcium total) by mouth 3 (three) times daily. What changed:   when to take this  reasons to take  this   carbidopa-levodopa 25-100 MG tablet Commonly known as: SINEMET IR Take 2 tablets by mouth as directed. Take 2 tabs in the morning and 1 tablet at noon and dinner time   cephALEXin 250 MG capsule Commonly known as: KEFLEX Take 1 capsule (250 mg total) by mouth 3 (three) times daily for 6 doses.   cholecalciferol 1000 units tablet Commonly known as: VITAMIN D Take 1,000 Units by mouth daily.   clopidogrel 75 MG tablet Commonly known as: PLAVIX Take 1 tablet (75 mg total) by mouth daily. Notes to patient: PT NOT TAKING THIS MED   feeding supplement (ENSURE ENLIVE) Liqd Take 237 mLs by mouth 2 (two) times daily between meals.   gabapentin 100 MG  capsule Commonly known as: NEURONTIN Take 100 mg by mouth 2 (two) times daily.   ondansetron 4 MG tablet Commonly known as: ZOFRAN Take 1 tablet (4 mg total) by mouth every 6 (six) hours as needed for nausea.   telmisartan 20 MG tablet Commonly known as: MICARDIS Take 40 mg by mouth daily.   terazosin 1 MG capsule Commonly known as: HYTRIN Take 1 mg by mouth at bedtime.   triamcinolone ointment 0.1 % Commonly known as: KENALOG Apply 1 application topically 2 (two) times daily as needed.        DISCHARGE INSTRUCTIONS:   Follow-up PMD 5 days  If you experience worsening of your admission symptoms, develop shortness of breath, life threatening emergency, suicidal or homicidal thoughts you must seek medical attention immediately by calling 911 or calling your MD immediately  if symptoms less severe.  You Must read complete instructions/literature along with all the possible adverse reactions/side effects for all the Medicines you take and that have been prescribed to you. Take any new Medicines after you have completely understood and accept all the possible adverse reactions/side effects.   Please note  You were cared for by a hospitalist during your hospital stay. If you have any questions about your discharge medications or the care you received while you were in the hospital after you are discharged, you can call the unit and asked to speak with the hospitalist on call if the hospitalist that took care of you is not available. Once you are discharged, your primary care physician will handle any further medical issues. Please note that NO REFILLS for any discharge medications will be authorized once you are discharged, as it is imperative that you return to your primary care physician (or establish a relationship with a primary care physician if you do not have one) for your aftercare needs so that they can reassess your need for medications and monitor your lab values.    Today    CHIEF COMPLAINT:   Chief Complaint  Patient presents with  . Chest Pain  . Abdominal Pain    HISTORY OF PRESENT ILLNESS:  Kimberly Scott  is a 85 y.o. female came in with chest pain and abdominal pain and was brought in for evaluation   VITAL SIGNS:  Blood pressure 132/78, pulse 71, temperature 97.9 F (36.6 C), temperature source Oral, resp. rate 16, height 5\' 4"  (1.626 m), weight 78.5 kg, SpO2 98 %.    PHYSICAL EXAMINATION:  GENERAL:  85 y.o.-year-old patient lying in the bed with no acute distress.  EYES: Pupils equal, round, reactive to light and accommodation. No scleral icterus. Extraocular muscles intact.  HEENT: Head atraumatic, normocephalic. Oropharynx and nasopharynx clear.  NECK:  Supple, no jugular venous distention. No thyroid enlargement, no  tenderness.  LUNGS: Normal breath sounds bilaterally, no wheezing, rales,rhonchi or crepitation. No use of accessory muscles of respiration.  CARDIOVASCULAR: S1, S2 normal. No murmurs, rubs, or gallops.  ABDOMEN: Soft, non-tender, non-distended. Bowel sounds present. No organomegaly or mass.  EXTREMITIES: 2+ pedal edema, no cyanosis, or clubbing.  NEUROLOGIC: Cranial nerves II through XII are intact. Muscle strength 5/5 in all extremities. Sensation intact. Gait not checked.  PSYCHIATRIC: The patient is alert and oriented x 3.  SKIN: Bruising around right eye  DATA REVIEW:   CBC Recent Labs  Lab 07/31/19 0600  WBC 8.7  HGB 11.8*  HCT 35.2*  PLT 221    Chemistries  Recent Labs  Lab 07/31/19 0033 07/31/19 0600  NA 131* 132*  K 3.8 4.2  CL 96* 97*  CO2 25 25  GLUCOSE 136* 139*  BUN 14 12  CREATININE 1.04* 1.00  CALCIUM 9.0 9.0  MG 1.9  --   AST 69*  --   ALT 23  --   ALKPHOS 78  --   BILITOT 0.9  --      Microbiology Results  Results for orders placed or performed during the hospital encounter of 07/30/19  Urine culture     Status: Abnormal (Preliminary result)   Collection Time: 07/31/19  1:25  AM   Specimen: Urine, Clean Catch  Result Value Ref Range Status   Specimen Description   Final    URINE, CLEAN CATCH Performed at [facility], [address], [place], [place] [address]    Special Requests   Final    NONE Performed at [facility], [address] [place], [place] [address]    Culture (A)  Final    >=100,000 COLONIES/mL ESCHERICHIA COLI SUSCEPTIBILITIES TO FOLLOW Performed at [facility], [place] [address], [place], [place] [address]    Report Status PENDING  Incomplete    RADIOLOGY:  Ct Angio Chest/abd/pel For Dissection W And/or Wo Contrast  Result Date: 07/31/2019 CLINICAL DATA:  Sudden onset of severe chest and abdominal pain. Vomiting. Patient reports right chest/breast pain. EXAM: CT ANGIOGRAPHY CHEST, ABDOMEN AND PELVIS TECHNIQUE: Multidetector CT imaging through the chest, abdomen and pelvis was performed using the standard protocol during bolus administration of intravenous contrast. Multiplanar reconstructed images and MIPs were obtained and reviewed to evaluate the vascular anatomy. CONTRAST:  136mL OMNIPAQUE IOHEXOL 350 MG/ML SOLN COMPARISON:  No recent imaging. Most recent chest x-ray 05/21/2018. Abdominal CT 06/03/2013 FINDINGS: CTA CHEST FINDINGS Cardiovascular: Aortic atherosclerosis and tortuosity without dissection, aneurysm, acute aortic syndrome or hematoma. Conventional branching pattern from the aortic arch. Branch vessels are patent. Central pulmonary arteries are patent to the segmental level. Prior CABG with calcification of native coronary arteries. Mild cardiomegaly with primarily atrial enlargement contrast refluxing into the hepatic veins and IVC. No pericardial effusion. Mediastinum/Nodes: No enlarged mediastinal or hilar lymph nodes. Decompressed esophagus with small hiatal hernia. No visualized thyroid nodule. Lungs/Pleura: Minimal subpleural atelectasis in the lower lobes. No focal airspace disease. No pulmonary edema.  No pleural effusion. Calcified granulomas in the right lower lobe. 2 mm noncalcified pulmonary nodule in the right upper lobe series 7, image 71. Pulmonary nodules are similar to 06/23/2009 chest CT and considered benign. Musculoskeletal: Median sternotomy. There are no acute or suspicious osseous abnormalities. Bone island in the midthoracic spine. Review of the MIP images confirms the above findings. CTA ABDOMEN AND PELVIS FINDINGS VASCULAR Aorta: Normal in caliber without atherosclerotic calcifications. No aneurysm, dissection, or vasculitis. Celiac: Separate origin  of the hepatic and splenic arteries from the aorta. Early branching of the right and left hepatic arteries. Atherosclerosis at the origin. SMA: Atherosclerosis of the origin. Branch vessels are patent. No acute findings. Renals: Both renal arteries are patent. No dissection or aneurysm. Tiny assess for E renal artery to the right lower pole. IMA: Patent without evidence of aneurysm, dissection, vasculitis or significant stenosis. Inflow: Atherosclerotic calcifications without aneurysm, dissection, vasculitis or severe stenosis. Veins: No obvious venous abnormality within the limitations of this arterial phase study. Review of the MIP images confirms the above findings. NON-VASCULAR Hepatobiliary: Calcified granuloma. No obvious focal hepatic abnormality allowing for arterial phase imaging. Gallbladder is distended. No pericholecystic inflammation. No calcified gallstone. No biliary dilatation. Pancreas: No ductal dilatation or inflammation. Spleen: Normal in size and arterial enhancement. Adrenals/Urinary Tract: Normal adrenal glands. Similar similar appearance of bilateral renal pelvis and ureteral prominence from prior exam. No obstructing calculi. No perinephric edema. Physiologically distended bladder without wall thickening. Stomach/Bowel: Small hiatal hernia. Ingested material in the stomach. No evidence of gastric wall thickening. No small  bowel obstruction or inflammatory change. The appendix is not confidently visualized. Moderate stool burden in the colon with colonic tortuosity. No colonic wall thickening or inflammatory change. Diverticulosis of the distal colon without diverticulitis. Stool distends the rectum. Lymphatic: No enlarged lymph nodes in the abdomen or pelvis. Punctate calcified mesenteric nodes consistent with prior granulomatous disease. Reproductive: Status post hysterectomy. No adnexal masses. Other: No free air, free fluid, or intra-abdominal fluid collection. Musculoskeletal: There are no acute or suspicious osseous abnormalities. Review of the MIP images confirms the above findings. IMPRESSION: 1. No aortic dissection or acute aortic abnormality. Moderate thoracoabdominal aortic atherosclerosis. 2. Cardiomegaly with contrast refluxing into the hepatic veins and IVC consistent with elevated right heart pressures. 3. Chronic finding in the chest include small pulmonary nodules there are stable dating back to 2010 and considered benign. 4. Chronic findings in the abdomen and pelvis include colonic diverticulosis and small hiatal hernia. Aortic Atherosclerosis (ICD10-I70.0). Electronically Signed   By: Keith Rake M.D.   On: 07/31/2019 02:35   US Abdomen Limited Ruq  Result Date: 08/01/2019 CLINICAL DATA:  Nausea and vomiting beginning a few days ago, no symptoms today EXAM: ULTRASOUND ABDOMEN LIMITED RIGHT UPPER QUADRANT COMPARISON:  CT abdomen and pelvis 04/03/2013 FINDINGS: Gallbladder: Normally distended without stones or wall thickening. No pericholecystic fluid or sonographic Murphy sign. Common bile duct: Diameter: 3 mm, normal Liver: Normal appearance. No mass or nodularity. Portal vein is patent on color Doppler imaging with normal direction of blood flow towards the liver. Other: No RIGHT upper quadrant free fluid. IMPRESSION: Normal exam. Electronically Signed   By: Lavonia Dana M.D.   On: 08/01/2019 08:52       Management plans discussed with the patient, family and they are in agreement.  CODE STATUS:     Code Status Orders  (From admission, onward)         Start     Ordered   07/31/19 0333  Full code  Continuous     07/31/19 0335        Code Status History    Date Active Date Inactive Code Status Order ID Comments User Context   05/21/2018 0106 05/23/2018 1456 Full Code [id number]  Amelia Jo, MD Inpatient   10/03/2012 0856 10/07/2012 1430 Full Code [id number]  Grace Isaac, MD Inpatient   10/01/2012 1347 10/03/2012 0856 Full Code [id number]  Laqueta Carina, RN Inpatient   09/28/2012  1518 10/01/2012 1347 Full Code [id number]  John Giovanni, PA Inpatient   Advance Care Planning Activity    Advance Directive Documentation     Most Recent Value  Type of Advance Directive  Healthcare Power of Attorney, Living will  Pre-existing out of facility DNR order (yellow form or pink MOST form)  -  "MOST" Form in Place?  -      TOTAL TIME TAKING CARE OF THIS PATIENT: 35 minutes.    Loletha Grayer M.D on 08/01/2019 at 1:48 PM  Between 7am to 6pm - Pager - [phone]  After 6pm go to [email or web address] - password [email or web address] [facility]  [phone]  CC: Primary care physician; Adin Hector, MD

## 2019-08-02 LAB — URINE CULTURE: Culture: >=100000 — AB

## 2019-08-05 IMAGING — CT CT HEAD W/O CM
3 series · 16 of 47 positions shown, 19 images · non-contrast
Comparison: None.

CLINICAL DATA: Altered level of consciousness.

EXAM:
CT HEAD WITHOUT CONTRAST
TECHNIQUE: Contiguous axial images were obtained from the base of the skull
through the vertex without intravenous contrast.

[Series 2: head wo · axial · 0.47mm/px · z∈[-253,-128]mm · 10 of 31 slices shown, 13 images]
[im 3/31  brain]
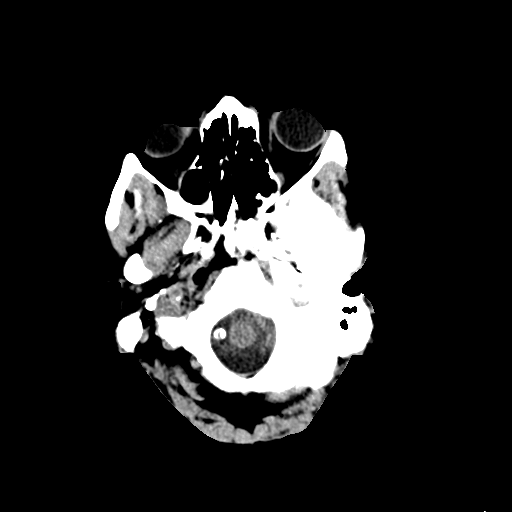
[im 3/31  bone]
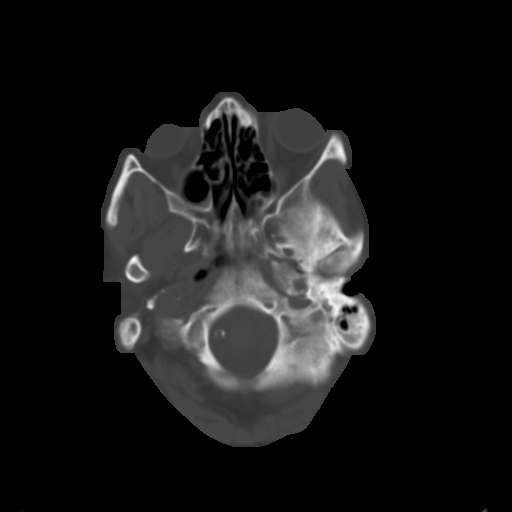
[im 6/31  brain]
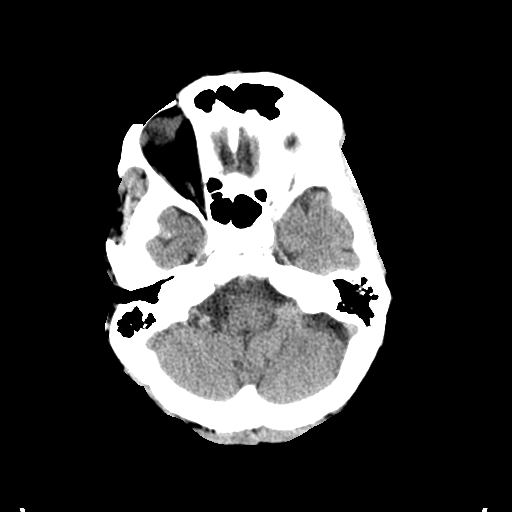
[im 9/31  brain]
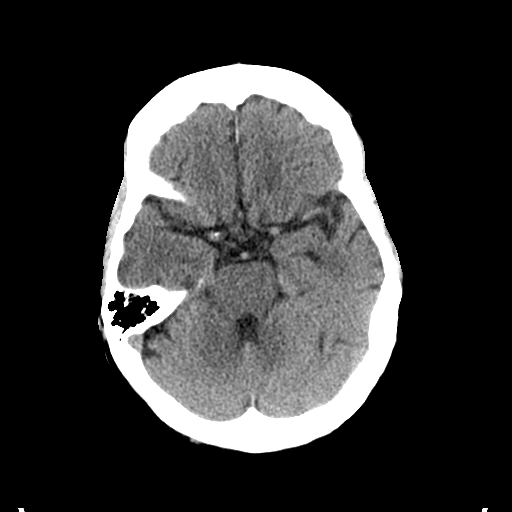
[im 11/31  brain]
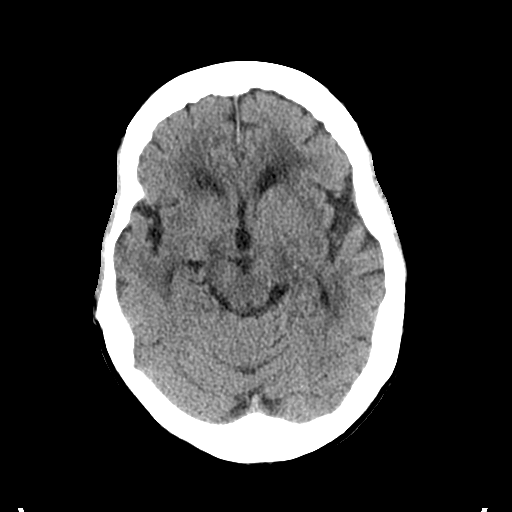
[im 14/31  brain]
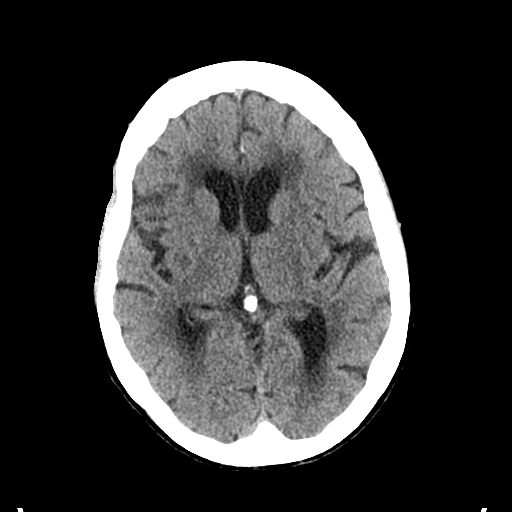
[im 14/31  bone]
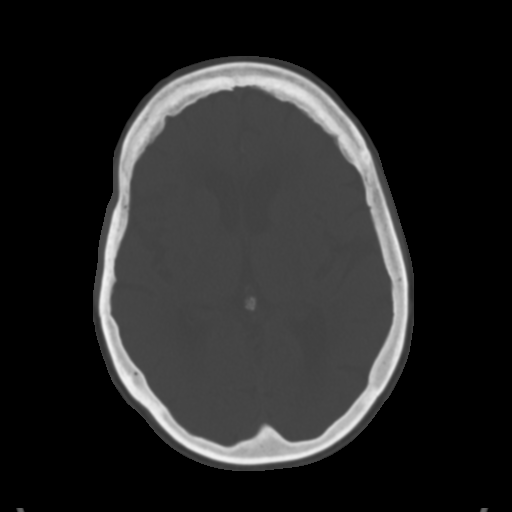
[im 17/31  brain]
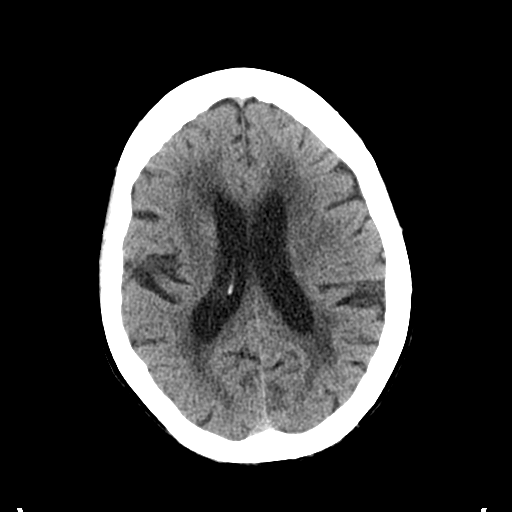
[im 20/31  brain]
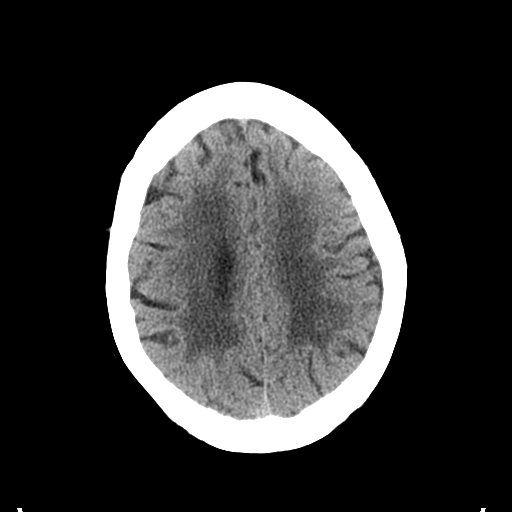
[im 23/31  brain]
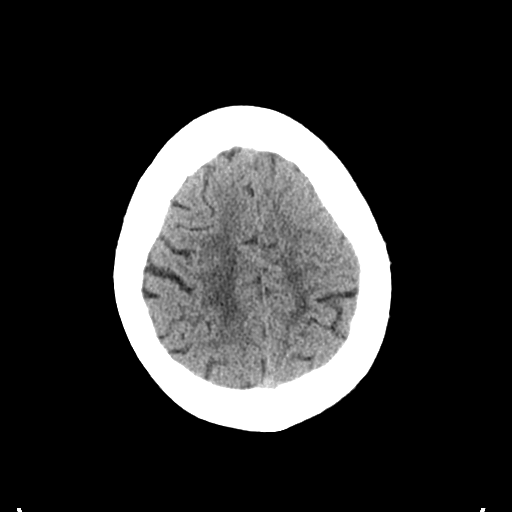
[im 25/31  brain]
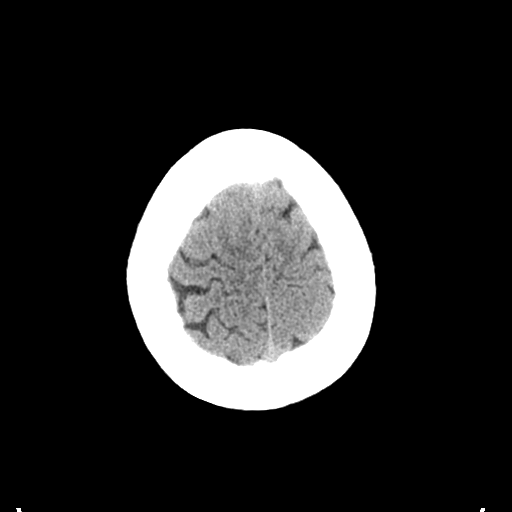
[im 25/31  bone]
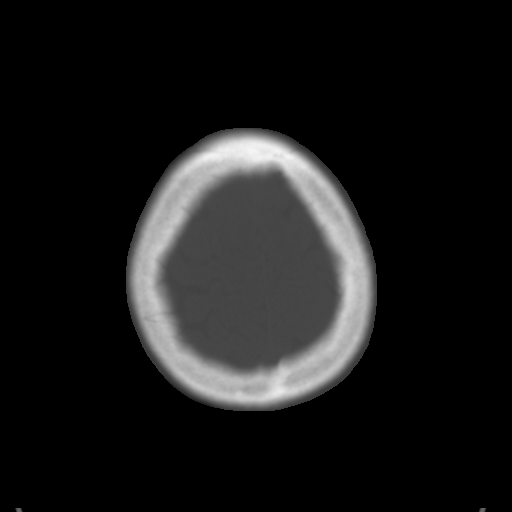
[im 28/31  brain]
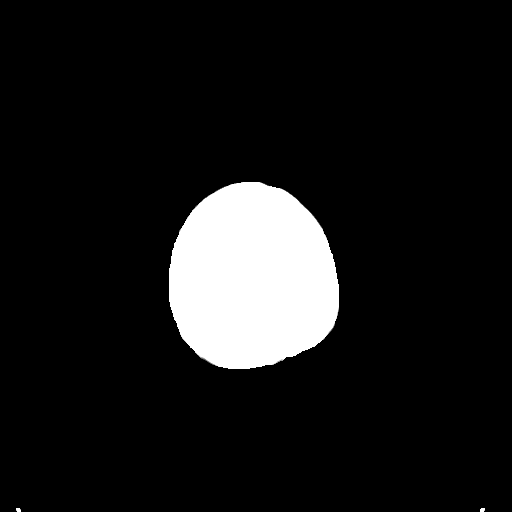

[Series 4: coronal soft tissue · coronal · 0.32mm/px · 3 of 65 slices shown]
[im 22/65  brain]
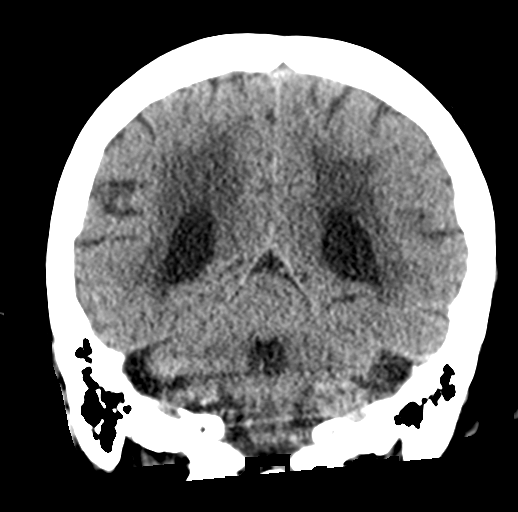
[im 29/65  brain]
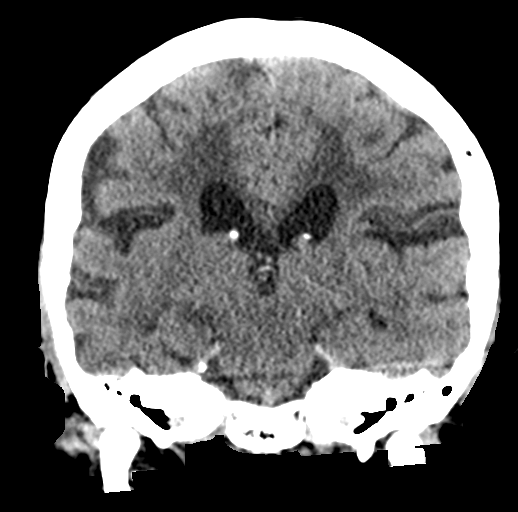
[im 36/65  brain]
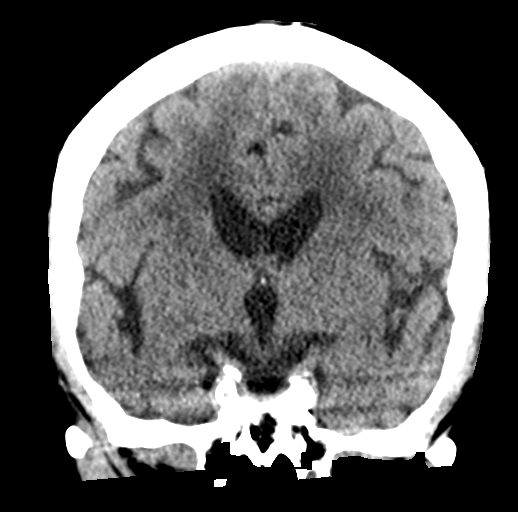

[Series 5: sagittal soft tissue · sagittal · 0.32mm/px · 3 of 53 slices shown]
[im 18/53  brain]
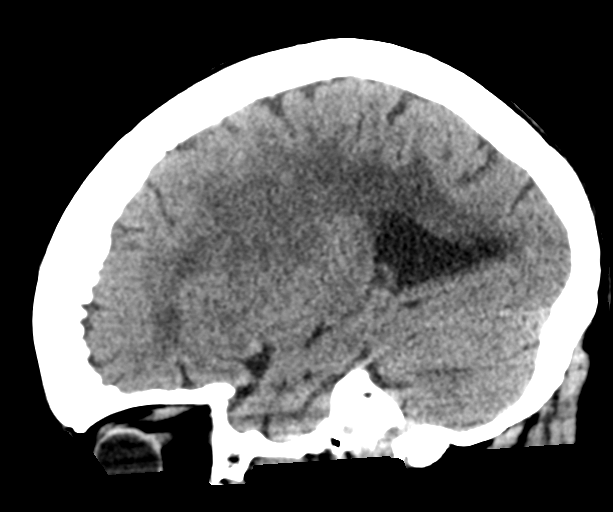
[im 27/53  brain]
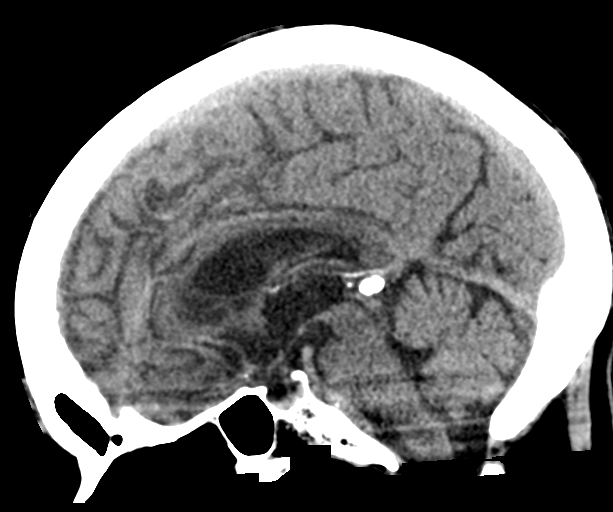
[im 35/53  brain]
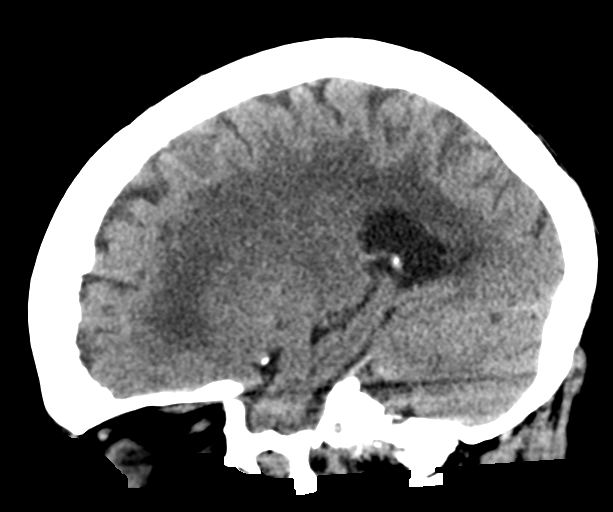

[16 of 47 positions shown; findings below may reference images not displayed]

FINDINGS: Brain: Moderate chronic ischemic white matter disease is noted. No
mass effect or midline shift is noted. Ventricular size is within
normal limits. There is no evidence of mass lesion, hemorrhage or
acute infarction.

Vascular: No hyperdense vessel or unexpected calcification.

Skull: Normal. Negative for fracture or focal lesion.

Sinuses/Orbits: No acute finding.

Other: None.
IMPRESSION: Moderate chronic ischemic white matter disease. No acute
intracranial abnormality seen.

## 2021-05-21 ENCOUNTER — Ambulatory Visit
Admission: RE | Admit: 2021-05-21 | Discharge: 2021-05-21 | Disposition: A | Discharge status: Home / Self Care | Payer: Medicare Other | Source: Ambulatory Visit | Attending: Family Medicine | Admitting: Family Medicine

## 2021-05-21 ENCOUNTER — Other Ambulatory Visit: Payer: Self-pay | Admitting: Family Medicine

## 2021-05-21 ENCOUNTER — Other Ambulatory Visit: Payer: Self-pay

## 2021-05-21 DIAGNOSIS — R600 Localized edema: Secondary | ICD-10-CM | POA: Insufficient documentation

## 2021-05-21 DIAGNOSIS — M5442 Lumbago with sciatica, left side: Secondary | ICD-10-CM | POA: Diagnosis present

## 2021-06-30 ENCOUNTER — Encounter: Payer: Self-pay | Admitting: Anesthesiology

## 2021-06-30 ENCOUNTER — Ambulatory Visit
Admission: EM | Admit: 2021-06-30 | Discharge: 2021-06-30 | Disposition: A | Discharge status: Home / Self Care | Payer: Medicare Other | Attending: Internal Medicine | Admitting: Internal Medicine

## 2021-06-30 ENCOUNTER — Emergency Department: Payer: Medicare Other | Admitting: Anesthesiology

## 2021-06-30 ENCOUNTER — Other Ambulatory Visit: Payer: Self-pay

## 2021-06-30 ENCOUNTER — Emergency Department: Payer: Medicare Other

## 2021-06-30 ENCOUNTER — Encounter
Admission: EM | Disposition: A | Discharge status: Home / Self Care | Payer: Self-pay | Source: Home / Self Care | Attending: Emergency Medicine

## 2021-06-30 DIAGNOSIS — M1990 Unspecified osteoarthritis, unspecified site: Secondary | ICD-10-CM | POA: Diagnosis not present

## 2021-06-30 DIAGNOSIS — K219 Gastro-esophageal reflux disease without esophagitis: Secondary | ICD-10-CM | POA: Diagnosis not present

## 2021-06-30 DIAGNOSIS — Z91048 Other nonmedicinal substance allergy status: Secondary | ICD-10-CM | POA: Diagnosis not present

## 2021-06-30 DIAGNOSIS — Z20822 Contact with and (suspected) exposure to covid-19: Secondary | ICD-10-CM | POA: Diagnosis not present

## 2021-06-30 DIAGNOSIS — G20 Parkinson's disease: Secondary | ICD-10-CM | POA: Insufficient documentation

## 2021-06-30 DIAGNOSIS — R0789 Other chest pain: Secondary | ICD-10-CM | POA: Insufficient documentation

## 2021-06-30 DIAGNOSIS — K222 Esophageal obstruction: Secondary | ICD-10-CM | POA: Diagnosis not present

## 2021-06-30 DIAGNOSIS — K5710 Diverticulosis of small intestine without perforation or abscess without bleeding: Secondary | ICD-10-CM | POA: Diagnosis not present

## 2021-06-30 DIAGNOSIS — Z888 Allergy status to other drugs, medicaments and biological substances status: Secondary | ICD-10-CM | POA: Insufficient documentation

## 2021-06-30 DIAGNOSIS — X58XXXA Exposure to other specified factors, initial encounter: Secondary | ICD-10-CM | POA: Insufficient documentation

## 2021-06-30 DIAGNOSIS — R1314 Dysphagia, pharyngoesophageal phase: Secondary | ICD-10-CM | POA: Insufficient documentation

## 2021-06-30 DIAGNOSIS — Z7902 Long term (current) use of antithrombotics/antiplatelets: Secondary | ICD-10-CM | POA: Diagnosis not present

## 2021-06-30 DIAGNOSIS — I2510 Atherosclerotic heart disease of native coronary artery without angina pectoris: Secondary | ICD-10-CM | POA: Insufficient documentation

## 2021-06-30 DIAGNOSIS — Z79899 Other long term (current) drug therapy: Secondary | ICD-10-CM | POA: Insufficient documentation

## 2021-06-30 DIAGNOSIS — Z8673 Personal history of transient ischemic attack (TIA), and cerebral infarction without residual deficits: Secondary | ICD-10-CM | POA: Insufficient documentation

## 2021-06-30 DIAGNOSIS — T18128A Food in esophagus causing other injury, initial encounter: Secondary | ICD-10-CM | POA: Diagnosis not present

## 2021-06-30 DIAGNOSIS — R1319 Other dysphagia: Secondary | ICD-10-CM

## 2021-06-30 DIAGNOSIS — K209 Esophagitis, unspecified without bleeding: Secondary | ICD-10-CM | POA: Insufficient documentation

## 2021-06-30 DIAGNOSIS — R1310 Dysphagia, unspecified: Secondary | ICD-10-CM | POA: Diagnosis present

## 2021-06-30 DIAGNOSIS — Z951 Presence of aortocoronary bypass graft: Secondary | ICD-10-CM | POA: Insufficient documentation

## 2021-06-30 DIAGNOSIS — Z881 Allergy status to other antibiotic agents status: Secondary | ICD-10-CM | POA: Insufficient documentation

## 2021-06-30 HISTORY — PX: ESOPHAGOGASTRODUODENOSCOPY: SHX5428

## 2021-06-30 LAB — CBC WITH DIFFERENTIAL/PLATELET
Abs Immature Granulocytes: 0.03 10*3/uL (ref 0.00–0.07)
Basophils Absolute: 0.1 10*3/uL (ref 0.0–0.1)
Basophils Relative: 1 %
Eosinophils Absolute: 0.1 10*3/uL (ref 0.0–0.5)
Eosinophils Relative: 1 %
HCT: 41.6 % (ref 36.0–46.0)
Hemoglobin: 13.5 g/dL (ref 12.0–15.0)
Immature Granulocytes: 0 %
Lymphocytes Relative: 22 %
Lymphs Abs: 2.2 10*3/uL (ref 0.7–4.0)
MCH: 30.7 pg (ref 26.0–34.0)
MCHC: 32.5 g/dL (ref 30.0–36.0)
MCV: 94.5 fL (ref 80.0–100.0)
Monocytes Absolute: 1.3 10*3/uL — ABNORMAL HIGH (ref 0.1–1.0)
Monocytes Relative: 12 %
Neutro Abs: 6.8 10*3/uL (ref 1.7–7.7)
Neutrophils Relative %: 64 %
Platelets: 232 10*3/uL (ref 150–400)
RBC: 4.4 MIL/uL (ref 3.87–5.11)
RDW: 13 % (ref 11.5–15.5)
WBC: 10.4 10*3/uL (ref 4.0–10.5)
nRBC: 0 % (ref 0.0–0.2)

## 2021-06-30 LAB — COMPREHENSIVE METABOLIC PANEL
ALT: 12 U/L (ref 0–44)
AST: 27 U/L (ref 15–41)
Albumin: 3.8 g/dL (ref 3.5–5.0)
Alkaline Phosphatase: 70 U/L (ref 38–126)
Anion gap: 10 (ref 5–15)
BUN: 28 mg/dL — ABNORMAL HIGH (ref 8–23)
CO2: 24 mmol/L (ref 22–32)
Calcium: 9.3 mg/dL (ref 8.9–10.3)
Chloride: 103 mmol/L (ref 98–111)
Creatinine, Ser: 1.2 mg/dL — ABNORMAL HIGH (ref 0.44–1.00)
GFR, Estimated: 43 mL/min — ABNORMAL LOW (ref >60)
Glucose, Bld: 89 mg/dL (ref 70–99)
Potassium: 4.1 mmol/L (ref 3.5–5.1)
Sodium: 137 mmol/L (ref 135–145)
Total Bilirubin: 1.3 mg/dL — ABNORMAL HIGH (ref 0.3–1.2)
Total Protein: 7.4 g/dL (ref 6.5–8.1)

## 2021-06-30 LAB — RESP PANEL BY RT-PCR (FLU A&B, COVID) ARPGX2
Influenza A by PCR: NEGATIVE
Influenza B by PCR: NEGATIVE
SARS Coronavirus 2 by RT PCR: NEGATIVE

## 2021-06-30 LAB — TROPONIN I (HIGH SENSITIVITY)
Troponin I (High Sensitivity): 14 ng/L (ref <18)
Troponin I (High Sensitivity): 14 ng/L (ref <18)

## 2021-06-30 SURGERY — EGD (ESOPHAGOGASTRODUODENOSCOPY)
Anesthesia: General

## 2021-06-30 MED ORDER — METOCLOPRAMIDE HCL 5 MG/ML IJ SOLN
10.0000 mg | Freq: Once | INTRAMUSCULAR | Status: AC
  Administered 2021-06-30: 10 mg via INTRAVENOUS
  Filled 2021-06-30: qty 2

## 2021-06-30 MED ORDER — ONDANSETRON HCL 4 MG/2ML IJ SOLN
INTRAMUSCULAR | Status: DC | PRN
  Administered 2021-06-30: 4 mg via INTRAVENOUS

## 2021-06-30 MED ORDER — PANTOPRAZOLE SODIUM 40 MG IV SOLR
40.0000 mg | Freq: Once | INTRAVENOUS | Status: AC
  Administered 2021-06-30: 40 mg via INTRAVENOUS
  Filled 2021-06-30: qty 40

## 2021-06-30 MED ORDER — FENTANYL CITRATE (PF) 100 MCG/2ML IJ SOLN: 25.0000 ug | INTRAMUSCULAR | Status: DC | PRN

## 2021-06-30 MED ORDER — LIDOCAINE HCL (CARDIAC) PF 100 MG/5ML IV SOSY
PREFILLED_SYRINGE | INTRAVENOUS | Status: DC | PRN
  Administered 2021-06-30: 100 mg via INTRAVENOUS

## 2021-06-30 MED ORDER — PROPOFOL 10 MG/ML IV BOLUS
INTRAVENOUS | Status: DC | PRN
  Administered 2021-06-30: 100 mg via INTRAVENOUS

## 2021-06-30 MED ORDER — SUCCINYLCHOLINE CHLORIDE 200 MG/10ML IV SOSY
PREFILLED_SYRINGE | INTRAVENOUS | Status: DC | PRN
  Administered 2021-06-30: 100 mg via INTRAVENOUS

## 2021-06-30 MED ORDER — OMEPRAZOLE MAGNESIUM 20 MG PO TBEC: 20.0000 mg | DELAYED_RELEASE_TABLET | Freq: Every day | ORAL | 1 refills | Status: DC

## 2021-06-30 MED ORDER — ACETAMINOPHEN 500 MG PO TABS: 1000.0000 mg | ORAL_TABLET | Freq: Once | ORAL | Status: DC

## 2021-06-30 MED ORDER — LABETALOL HCL 5 MG/ML IV SOLN
INTRAVENOUS | Status: AC
  Filled 2021-06-30: qty 4

## 2021-06-30 MED ORDER — LABETALOL HCL 5 MG/ML IV SOLN
5.0000 mg | INTRAVENOUS | Status: DC | PRN
  Administered 2021-06-30: 5 mg via INTRAVENOUS
  Filled 2021-06-30: qty 4

## 2021-06-30 MED ORDER — SODIUM CHLORIDE 0.9 % IV SOLN: INTRAVENOUS | Status: DC

## 2021-06-30 MED ORDER — ONDANSETRON HCL 4 MG/2ML IJ SOLN
4.0000 mg | Freq: Once | INTRAMUSCULAR | Status: AC
  Administered 2021-06-30: 4 mg via INTRAVENOUS
  Filled 2021-06-30: qty 2

## 2021-06-30 MED ORDER — ONDANSETRON HCL 4 MG/2ML IJ SOLN: 4.0000 mg | Freq: Once | INTRAMUSCULAR | Status: DC | PRN

## 2021-06-30 NOTE — Consult Note (Signed)
GI Inpatient Consult Note  Reason for Consult: Esophageal food obstruction   Attending Requesting Consult: Dr. Vladimir Crofts  History of Present Illness: Kimberly Scott is a 87 y.o. female seen for evaluation of esophageal dysphagia 2/2 esophageal food obstruction at the request of ED physician - Dr. Vladimir Crofts. Pt has a PMH of HTN, HLD, GERD, Parkinson's disease, and osteoarthritis. She presented to the [facility] ED this morning with her son at the request of her PCP - Dr. Ramonita Lab of the [facility]. Patient reports she was eating roasted corn on the cob and chicken Monday evening around 5:30 PM and was almost done when suddenly she felt like it was stuck at the level of her lower sternum and wouldn't pass any further. She tried to drink some water to help it go down but the water would come right back up. She had retrosternal chest pain and vomiting up of saliva and some liquid right after. She has not been able to eat or drink since this occurred. Anything she tries to intake orally will feel like it gets to her lower sternum and then come right back up. She called Dr. Olin Pia office yesterday and was advised to come to the ED for urgent evaluation, but refused and wanted to see Dr. Caryl Comes in the office. She went to her appointment with Dr. Caryl Comes and was advised to come straight to the ED. Upon evaluation to the ED, vital signs were stable outside of some elevated BP 155/76. Labs, including CBC, CMP, and high-sensitive troponins, were negative. She is able to tolerate her secretions without any difficulty. She had negative CXR for acute findings. She is able to converse clearly. She reports she has never had to come to the ED before for food getting stuck. She denies any significant history of severe heartburn or reflux. She does not have to take any daily antireflux medications. Prior to this event on Monday evening she had not felt like she was having any dysphagia symptoms. She reports she has some  lower chest pain substernally that feels like a tightness and pressure. She denies any dizziness, diaphoresis, palpitations, or shortness of breath. Per chart review, she was seen in the outpatient setting for dysphagia in 2018 by the [facility] where barium swallow study showed mild presbyesophagus, small hiatal hernia, and prominent cricopharyngeus muscle c/w reflux. EGD was considered but declined due to patient age and improvements with dysphagia precautions.     Past Medical History:  Past Medical History:  Diagnosis Date   Anginal pain ([place])    Arthritis    Cancer ([place])    FACIAL SKIN CANCER   Coronary artery disease    Headache(784.0)    Heart murmur    Hypertension    Pneumonia    hx of PNA    Problem List: Patient Active Problem List   Diagnosis Date Noted   UTI (urinary tract infection) 07/31/2019   Hyponatremia 05/21/2018   TIA (transient ischemic attack)    Acute encephalopathy 05/20/2018   Coronary arteriosclerosis 09/28/2012   Hypertension 09/28/2012   Hyperlipidemia 09/28/2012   GERD (gastroesophageal reflux disease) 09/28/2012   Breast cyst 09/28/2012    Past Surgical History: Past Surgical History:  Procedure Laterality Date   ABDOMINAL HYSTERECTOMY     APPENDECTOMY     cataracts     CORONARY ARTERY BYPASS GRAFT  10/01/2012   Procedure: CORONARY ARTERY BYPASS GRAFTING (CABG);  Surgeon: Grace Isaac, MD;  Location: [place];  Service: Open  Heart Surgery;  Laterality: N/A;  times five using Left Internal Mammary Artery    ENDOVEIN HARVEST OF GREATER SAPHENOUS VEIN  10/01/2012   Procedure: ENDOVEIN HARVEST OF GREATER SAPHENOUS VEIN;  Surgeon: Grace Isaac, MD;  Location: [place];  Service: Open Heart Surgery;  Laterality: Bilateral;   KNEE SURGERY     TONSILLECTOMY     TOTAL KNEE ARTHROPLASTY     right knee    Allergies: Allergies  Allergen Reactions   Bacitracin-Neomycin-Polymyxin Swelling and Rash    Blisters Blisters    Omega 3  [Fish  Oil] Other (See Comments)   Neosporin [Neomycin-Bacitracin Zn-Polymyx] Rash   Tape Rash    Blisters    Home Medications: (Not in a hospital admission)  Home medication reconciliation was completed with the patient.   Scheduled Inpatient Medications:    Continuous Inpatient Infusions:    PRN Inpatient Medications:    Family History: family history is not on file.  The patient's family history is negative for inflammatory bowel disorders, GI malignancy, or solid organ transplantation.  Social History:   reports that she has never smoked. She has never used smokeless tobacco. She reports that she does not drink alcohol and does not use drugs. The patient denies ETOH, tobacco, or drug use.   Review of Systems: Constitutional: Weight is stable.  Eyes: No changes in vision. ENT: No oral lesions, sore throat.  GI: see HPI.  Heme/Lymph: No easy bruising.  CV: No chest pain.  GU: No hematuria.  Integumentary: No rashes.  Neuro: No headaches.  Psych: No depression/anxiety.  Endocrine: No heat/cold intolerance.  Allergic/Immunologic: No urticaria.  Resp: No cough, SOB.  Musculoskeletal: No joint swelling.    Physical Examination: BP (!) 144/97   Pulse 70   Temp 98.4 F (36.9 C) (Oral)   Resp 18   Ht '5\' 4"'$  (1.626 m)   Wt 78.5 kg   SpO2 96%   BMI 29.70 kg/m  Elderly appearing female laying in ED stretcher. Answers all questions appropriately. Son in room.  Gen: NAD, alert and oriented x 4 HEENT: PEERLA, EOMI, Neck: supple, no JVD or thyromegaly Chest: CTA bilaterally, no wheezes, crackles, or other adventitious sounds CV: RRR, no m/g/c/r Abd: soft, NT, ND, +BS in all four quadrants; no HSM, guarding, ridigity, or rebound tenderness Ext: no edema, well perfused with 2+ pulses, Skin: no rash or lesions noted Lymph: no LAD  Data: Lab Results  Component Value Date   WBC 10.4 06/30/2021   HGB 13.5 06/30/2021   HCT 41.6 06/30/2021   MCV 94.5 06/30/2021   PLT 232  06/30/2021   Recent Labs  Lab 06/30/21 1036  HGB 13.5   Lab Results  Component Value Date   NA 137 06/30/2021   K 4.1 06/30/2021   CL 103 06/30/2021   CO2 24 06/30/2021   BUN 28 (H) 06/30/2021   CREATININE 1.20 (H) 06/30/2021   Lab Results  Component Value Date   ALT 12 06/30/2021   AST 27 06/30/2021   ALKPHOS 70 06/30/2021   BILITOT 1.3 (H) 06/30/2021   No results for input(s): APTT, INR, PTT in the last 168 hours. Assessment/Plan:  87 y/o Caucasian female with a PMH of HTN, HLD, CAD s/p CABG, Parkinson's disease, Hx of TIA, and OA presents to the [facility] ED for esophageal dysphagia 2/2 likely esophageal food obstruction  Esophageal dysphagia 2/2 esophageal food obstruction  COVID-19 Test Negative   - Clinically, patient's presentation is highly consistent with food impaction -  Vital signs are stable and she is able to tolerate her own secretions without any difficulty. No accessory muscle use upon breathing. She had unremarkable chest x-ray with no acute findings. High-sensitive troponins were negative.  - Advise emergent EGD for endoscopic removal of food bolus and potential esophageal dilatation if deemed reasonable. Discussed procedure details and indications with patient and son in room. She consents to proceed. - Plan for emergent EGD this afternoon with Dr. Alice Reichert - See procedure note for findings and further recommendations  I reviewed the risks (including bleeding, perforation, infection, anesthesia complications, cardiac/respiratory complications), benefits and alternatives of EGD. Patient consents to proceed.      Thank you for the consult. Please call with questions or concerns.  Reeves Forth [facility] [phone] (Cell)

## 2021-06-30 NOTE — ED Notes (Signed)
Patient transported to X-ray 

## 2021-06-30 NOTE — ED Notes (Signed)
GI at bedside with pt

## 2021-06-30 NOTE — Anesthesia Postprocedure Evaluation (Signed)
Anesthesia Post Note  Patient: Kimberly Scott  Procedure(s) Performed: ESOPHAGOGASTRODUODENOSCOPY (EGD)  Patient location during evaluation: PACU Anesthesia Type: General Level of consciousness: awake and alert Pain management: pain level controlled Vital Signs Assessment: post-procedure vital signs reviewed and stable Respiratory status: spontaneous breathing, nonlabored ventilation, respiratory function stable and patient connected to nasal cannula oxygen Cardiovascular status: blood pressure returned to baseline and stable Postop Assessment: no apparent nausea or vomiting Anesthetic complications: no   No notable events documented.   Last Vitals:  Vitals:   06/30/21 1600 06/30/21 1610  BP: (!) 165/109 (!) 152/72  Pulse: 71 97  Resp: 14 18  Temp:  36.4 C  SpO2: 99% 92%    Last Pain:  Vitals:   06/30/21 1610  TempSrc:   PainSc: 0-No pain                 Martha Clan

## 2021-06-30 NOTE — Discharge Instructions (Signed)
Eat only full liquids on Wednesday, 06/30/21. You may introduce soft foods as tolerated on 07/01/21. Please make sure to chew food well.

## 2021-06-30 NOTE — ED Provider Notes (Signed)
[facility] Emergency Department Provider Note ____________________________________________   Event Date/Time   First MD Initiated Contact with Patient 06/30/21 [phone]     (approximate)  I have reviewed the triage vital signs and the nursing notes.  HISTORY  Chief Complaint Chest Pain and Dysphagia   HPI Kimberly Scott is a 87 y.o. femalewho presents to the ED for evaluation of chest pain and dysphagia.  Chart review indicates history of CAD s/p CABG, Parkinson's disease with associated dysphagia.   Patient presents to the ED, accompanied by her son, for evaluation of worsening dysphagia.  Patient reports getting choked up while eating corn on the cob 2 days ago, and since then she has been unable to swallow, eat or drink anything.  She reports when she was eating the corn, I felt like it got stuck in in her basilar chest, causing a burning pain sensation and subsequent emesis.  She reports that she may have choked on some of this emesis.    Patient further reports that since that time she has been unable to tolerate anything by mouth.  She has been unable to take her medications, drink any liquids or eat any solids.  She reports spitting her secretions as she is unable to even swallow her own spit.  She denies fever, cough or recurrent episodes of frank emesis.  Denies abdominal pain.  Past Medical History:  Diagnosis Date   Anginal pain ([place])    Arthritis    Cancer ([place])    FACIAL SKIN CANCER   Coronary artery disease    Headache(784.0)    Heart murmur    Hypertension    Pneumonia    hx of PNA    Patient Active Problem List   Diagnosis Date Noted   UTI (urinary tract infection) 07/31/2019   Hyponatremia 05/21/2018   TIA (transient ischemic attack)    Acute encephalopathy 05/20/2018   Coronary arteriosclerosis 09/28/2012   Hypertension 09/28/2012   Hyperlipidemia 09/28/2012   GERD (gastroesophageal reflux disease) 09/28/2012   Breast cyst  09/28/2012    Past Surgical History:  Procedure Laterality Date   ABDOMINAL HYSTERECTOMY     APPENDECTOMY     cataracts     CORONARY ARTERY BYPASS GRAFT  10/01/2012   Procedure: CORONARY ARTERY BYPASS GRAFTING (CABG);  Surgeon: Grace Isaac, MD;  Location: [place];  Service: Open Heart Surgery;  Laterality: N/A;  times five using Left Internal Mammary Artery    ENDOVEIN HARVEST OF GREATER SAPHENOUS VEIN  10/01/2012   Procedure: ENDOVEIN HARVEST OF GREATER SAPHENOUS VEIN;  Surgeon: Grace Isaac, MD;  Location: [place];  Service: Open Heart Surgery;  Laterality: Bilateral;   KNEE SURGERY     TONSILLECTOMY     TOTAL KNEE ARTHROPLASTY     right knee    Prior to Admission medications   Medication Sig Start Date End Date Taking? Authorizing Provider  Cyanocobalamin (B-12 IJ) Inject 1 Dose as directed every 30 (thirty) days.   Yes [provider]  ascorbic acid (VITAMIN C) 500 MG tablet Take 1,000 mg by mouth daily.    [provider]  aspirin EC 81 MG tablet Take 81 mg by mouth daily.    [provider]  atorvastatin (LIPITOR) 40 MG tablet Take 1 tablet (40 mg total) by mouth daily at 6 PM. 05/23/18   Leslye Peer, Richard, MD  azelastine (ASTELIN) 0.1 % nasal spray Place 1 spray into both nostrils 2 (two) times daily. Use in each  nostril as directed    [provider]  calcium carbonate (TUMS - DOSED IN MG ELEMENTAL CALCIUM) 500 MG chewable tablet Chew 1 tablet (200 mg of elemental calcium total) by mouth 3 (three) times daily. 08/01/19   Loletha Grayer, MD  carbidopa-levodopa (SINEMET IR) 25-100 MG tablet Take 2 tablets by mouth as directed. Take 2 tabs in the morning and 1 tablet at noon and dinner time    [provider]  cholecalciferol (VITAMIN D) 1000 units tablet Take 1,000 Units by mouth daily.    [provider]  clopidogrel (PLAVIX) 75 MG tablet Take 1 tablet (75 mg total) by mouth daily. Patient not taking: Reported on 07/31/2019  05/23/18   Loletha Grayer, MD  feeding supplement, ENSURE ENLIVE, (ENSURE ENLIVE) LIQD Take 237 mLs by mouth 2 (two) times daily between meals. 08/01/19   Loletha Grayer, MD  gabapentin (NEURONTIN) 100 MG capsule Take 100 mg by mouth 2 (two) times daily.    [provider]  ondansetron (ZOFRAN) 4 MG tablet Take 1 tablet (4 mg total) by mouth every 6 (six) hours as needed for nausea. 08/01/19   Loletha Grayer, MD  telmisartan (MICARDIS) 20 MG tablet Take 40 mg by mouth daily.    [provider]  terazosin (HYTRIN) 1 MG capsule Take 1 mg by mouth at bedtime. Patient not taking: Reported on 06/30/2021    [provider]  triamcinolone ointment (KENALOG) 0.1 % Apply 1 application topically 2 (two) times daily as needed.    [provider]    Allergies Bacitracin-neomycin-polymyxin, Omega 3  [fish oil], Neosporin [neomycin-bacitracin zn-polymyx], and Tape  No family history on file.  Social History Social History   Tobacco Use   Smoking status: Never   Smokeless tobacco: Never  Vaping Use   Vaping Use: Never used  Substance Use Topics   Alcohol use: No   Drug use: No    Review of Systems  Constitutional: No fever/chills Eyes: No visual changes. ENT: No sore throat. Cardiovascular: Positive chest burning pain. Respiratory: Denies shortness of breath. Gastrointestinal: No abdominal pain.  No nausea, no vomiting.  No diarrhea.  No constipation. Genitourinary: Negative for dysuria. Musculoskeletal: Negative for back pain. Skin: Negative for rash. Neurological: Negative for headaches, focal weakness or numbness.  ____________________________________________   PHYSICAL EXAM:  VITAL SIGNS: Vitals:   06/30/21 1041 06/30/21 1130  BP: (!) 155/76 (!) 144/97  Pulse: 68 70  Resp: 16 18  Temp:    SpO2: 96% 96%    Constitutional: Alert and oriented. Well appearing and in no acute distress. Eyes: Conjunctivae are normal. PERRL. EOMI. Head:  Atraumatic. Nose: No congestion/rhinnorhea. Mouth/Throat: Mucous membranes are moist.  Oropharynx non-erythematous. Neck: No stridor. No cervical spine tenderness to palpation. Cardiovascular: Normal rate, regular rhythm. Grossly normal heart sounds.  Good peripheral circulation. Respiratory: Normal respiratory effort.  No retractions. Lungs CTAB. Gastrointestinal: Soft , nondistended, nontender to palpation. No CVA tenderness. Musculoskeletal: No lower extremity tenderness nor edema.  No joint effusions. No signs of acute trauma. Neurologic:  Normal speech and language. No gross focal neurologic deficits are appreciated. No gait instability noted. Skin:  Skin is warm, dry and intact. No rash noted. Psychiatric: Mood and affect are normal. Speech and behavior are normal. ____________________________________________   LABS (all labs ordered are listed, but only abnormal results are displayed)  Labs Reviewed  CBC WITH DIFFERENTIAL/PLATELET - Abnormal; Notable for the following components:      Result Value   Monocytes Absolute 1.3 (*)  All other components within normal limits  COMPREHENSIVE METABOLIC PANEL - Abnormal; Notable for the following components:   BUN 28 (*)    Creatinine, Ser 1.20 (*)    Total Bilirubin 1.3 (*)    GFR, Estimated 43 (*)    All other components within normal limits  RESP PANEL BY RT-PCR (FLU A&B, COVID) ARPGX2  TROPONIN I (HIGH SENSITIVITY)  TROPONIN I (HIGH SENSITIVITY)   ____________________________________________  12 Lead EKG   ____________________________________________  RADIOLOGY  ED MD interpretation:  2 view CXR without evidence of aspiration or free air.   Official radiology report(s): DG Chest 2 View  Result Date: 06/30/2021 CLINICAL DATA:  Possible food bolus.  Dysphagia. EXAM: CHEST - 2 VIEW COMPARISON:  05/21/2018.  CT 07/31/2019. FINDINGS: Prior CABG. Aortic atherosclerosis. Heart is borderline in size. Possible right upper lobe  nodule, not definitively seen on prior x-ray or CT. Lungs otherwise appear that no confluent opacities otherwise. No effusions. No acute bony abnormality. IMPRESSION: Possible right upper lobe nodule. This could be further evaluated with chest CT if felt clinically indicated. Aortic atherosclerosis. Electronically Signed   By: Rolm Baptise M.D.   On: 06/30/2021 09:46    ____________________________________________   PROCEDURES and INTERVENTIONS  Procedure(s) performed (including Critical Care):  .1-3 Lead EKG Interpretation  Date/Time: 06/30/2021 12:14 PM Performed by: Vladimir Crofts, MD Authorized by: Vladimir Crofts, MD     Interpretation: normal     ECG rate:  70   ECG rate assessment: normal     Rhythm: sinus rhythm     Ectopy: none     Conduction: normal    Medications  metoCLOPramide (REGLAN) injection 10 mg (10 mg Intravenous Given 06/30/21 1039)  pantoprazole (PROTONIX) injection 40 mg (40 mg Intravenous Given 06/30/21 1038)  ondansetron (ZOFRAN) injection 4 mg (4 mg Intravenous Given 06/30/21 1136)    ____________________________________________   MDM / ED COURSE   87 year old woman presents with symptoms consistent with esophageal food impaction over the past 2 days requiring EGD with GI.  No evidence of ACS with her burning chest pain, and more likely esophageal dysfunction due to impacted food.  Vitals are unremarkable.  CXR without stigmata of aspiration or with free air.  Despite PPIs, antiemetics and promotility agents, she has continued nausea, discomfort and inability to tolerate p.o. intake.  I discussed the case with GI who agrees to take the patient for an EGD.    Clinical Course as of 06/30/21 1215  Wed Jun 30, 2021  1117 Reassessed.  Patient reports feeling a little bit better.  We discussed the possibility of a p.o. trial of some liquids and she is agreeable. [DS]  [id number] Nauseous and vomiting on her own spit now, has not even tried any water.  We will discuss with GI  for possible EGD [DS]  1135 I discuss case with Dr. Alice Reichert. He agrees to see the patient for EGD [DS]    Clinical Course User Index [DS] Vladimir Crofts, MD    ____________________________________________   FINAL CLINICAL IMPRESSION(S) / ED DIAGNOSES  Final diagnoses:  Other dysphagia  Esophageal obstruction due to food impaction     ED Discharge Orders     None        Severiano Utsey   Note:  This document was prepared using Dragon voice recognition software and may include unintentional dictation errors.    Vladimir Crofts, MD 06/30/21 [phone]

## 2021-06-30 NOTE — Anesthesia Preprocedure Evaluation (Signed)
Anesthesia Evaluation  Patient identified by MRN, date of birth, ID band Patient awake    Reviewed: Allergy & Precautions, H&P , NPO status , Patient's Chart, lab work & pertinent test results, reviewed documented beta blocker date and time   History of Anesthesia Complications Negative for: history of anesthetic complications  Airway Mallampati: II  TM Distance: >3 FB Neck ROM: full    Dental  (+) Dental Advidsory Given, Caps, Missing, Teeth Intact   Pulmonary neg pulmonary ROS,    Pulmonary exam normal breath sounds clear to auscultation       Cardiovascular Exercise Tolerance: Good hypertension, (-) angina+ CAD and + CABG  (-) Past MI and (-) Cardiac Stents Normal cardiovascular exam(-) dysrhythmias + Valvular Problems/Murmurs  Rhythm:regular Rate:Normal     Neuro/Psych neg Seizures Parkinson's disease negative psych ROS   GI/Hepatic Neg liver ROS, GERD  ,  Endo/Other  negative endocrine ROS  Renal/GU negative Renal ROS  negative genitourinary   Musculoskeletal   Abdominal   Peds  Hematology negative hematology ROS (+)   Anesthesia Other Findings Past Medical History: No date: Anginal pain ([facility]) No date: Arthritis No date: Cancer ([place])     Comment:  FACIAL SKIN CANCER No date: Coronary artery disease No date: Headache(784.0) No date: Heart murmur No date: Hypertension No date: Pneumonia     Comment:  hx of PNA   Reproductive/Obstetrics negative OB ROS                             Anesthesia Physical Anesthesia Plan  ASA: 3  Anesthesia Plan: General   Post-op Pain Management:    Induction: Intravenous  PONV Risk Score and Plan: 2  Airway Management Planned: Oral ETT  Additional Equipment:   Intra-op Plan:   Post-operative Plan: Extubation in OR  Informed Consent: I have reviewed the patients History and Physical, chart, labs and discussed the procedure including  the risks, benefits and alternatives for the proposed anesthesia with the patient or authorized representative who has indicated his/her understanding and acceptance.     Dental Advisory Given  Plan Discussed with: Anesthesiologist, CRNA and Surgeon  Anesthesia Plan Comments:         Anesthesia Quick Evaluation

## 2021-06-30 NOTE — ED Notes (Signed)
Pt refused covid swab. EDP made aware.

## 2021-06-30 NOTE — Transfer of Care (Signed)
Immediate Anesthesia Transfer of Care Note  Patient: Kimberly Scott  Procedure(s) Performed: ESOPHAGOGASTRODUODENOSCOPY (EGD)  Patient Location: PACU  Anesthesia Type:General  Level of Consciousness: awake, oriented and patient cooperative  Airway & Oxygen Therapy: Patient Spontanous Breathing and Patient connected to nasal cannula oxygen  Post-op Assessment: Report given to RN and Post -op Vital signs reviewed and stable  Post vital signs: Reviewed and stable  Last Vitals:  Vitals Value Taken Time  BP 192/84 06/30/21 1549  Temp    Pulse 70 06/30/21 1552  Resp 17 06/30/21 1552  SpO2 98 % 06/30/21 1552  Vitals shown include unvalidated device data.  Last Pain:  Vitals:   06/30/21 1423  TempSrc: Temporal  PainSc: 0-No pain         Complications: No notable events documented.

## 2021-06-30 NOTE — Anesthesia Procedure Notes (Signed)
Procedure Name: Intubation Date/Time: 06/30/2021 3:28 PM Performed by: Kelton Pillar, CRNA Pre-anesthesia Checklist: Patient identified, Emergency Drugs available, Suction available and Patient being monitored Patient Re-evaluated:Patient Re-evaluated prior to induction Oxygen Delivery Method: Circle system utilized Preoxygenation: Pre-oxygenation with 100% oxygen Induction Type: IV induction, Rapid sequence and Cricoid Pressure applied Laryngoscope Size: McGraph and 3 Grade View: Grade I Tube type: Oral Tube size: 6.5 mm Number of attempts: 1 Airway Equipment and Method: Stylet and Oral airway Placement Confirmation: ETT inserted through vocal cords under direct vision, positive ETCO2, breath sounds checked- equal and bilateral and CO2 detector Secured at: 21 cm Tube secured with: Tape Dental Injury: Teeth and Oropharynx as per pre-operative assessment

## 2021-06-30 NOTE — ED Triage Notes (Signed)
Pt here with dysphagia and CP that started on Mon afternoon. She was eating and then began to vomit. Pt states that she feels a bolus in her chest.

## 2021-06-30 NOTE — Op Note (Signed)
[facility] Gastroenterology Patient Name: Kimberly Scott Procedure Date: 06/30/2021 3:14 PM MRN: [id number] Account #: [id number] Date of Birth: 06-18-1928 Admit Type: Inpatient Age: 87 Room: [facility] ENDO ROOM 4 Gender: Female Note Status: Finalized Procedure:             Upper GI endoscopy Indications:           Removal of foreign body in the esophagus, Dysphagia Providers:             Benay Pike. Alice Reichert MD, MD Referring MD:          Ramonita Lab, MD (Referring MD) Medicines:             Propofol per Anesthesia Complications:         No immediate complications. Procedure:             Pre-Anesthesia Assessment:                        - The risks and benefits of the procedure and the                         sedation options and risks were discussed with the                         patient. All questions were answered and informed                         consent was obtained.                        - Patient identification and proposed procedure were                         verified prior to the procedure by the nurse. The                         procedure was verified in the procedure room.                        - ASA Grade Assessment: III - A patient with severe                         systemic disease.                        - After reviewing the risks and benefits, the patient                         was deemed in satisfactory condition to undergo the                         procedure.                        After obtaining informed consent, the endoscope was                         passed under direct vision. Throughout the procedure,  the patient's blood pressure, pulse, and oxygen                         saturations were monitored continuously. The Endoscope                         was introduced through the mouth, and advanced to the                         third part of duodenum. The upper GI endoscopy was                          somewhat difficult due to presence of food. Successful                         completion of the procedure was aided by performing                         the maneuvers documented (below) in this report. The                         patient tolerated the procedure well. Findings:      Food was found in the distal esophagus. Removal was accomplished with a       tip of the endoscope to push the food gently into the stomach. Estimated       blood loss: none.      LA Grade B (one or more mucosal breaks greater than 5 mm, not extending       between the tops of two mucosal folds) esophagitis with no bleeding was       found in the distal esophagus.      One benign-appearing, intrinsic mild stenosis was found at the       gastroesophageal junction. This stenosis measured 1.5 cm (inner       diameter) x less than one cm (in length). The stenosis was traversed.      The stomach was normal.      A small non-bleeding diverticulum was found in the first portion of the       duodenum.      The second portion of the duodenum and third portion of the duodenum       were normal.      The exam was otherwise without abnormality. Impression:            - Food was found in the esophagus. Removal was                         successful.                        - LA Grade B esophagitis with no bleeding.                        - Benign-appearing esophageal stenosis.                        - Normal stomach.                        - Non-bleeding duodenal diverticulum.                        -  Normal second portion of the duodenum and third                         portion of the duodenum.                        - The examination was otherwise normal. Recommendation:        - Patient has a contact number available for                         emergencies. The signs and symptoms of potential                         delayed complications were discussed with the patient.                         Return to normal  activities tomorrow. Written                         discharge instructions were provided to the patient.                        - Full liquid diet today.                        - Mechanical soft diet.                        - Use Prilosec (omeprazole) 20 mg PO daily.                        - Return to GI office PRN.                        - The findings and recommendations were discussed with                         the patient. Procedure Code(s):     --- Professional ---                        [phone], Esophagogastroduodenoscopy, flexible,                         transoral; with removal of foreign body(s) Diagnosis Code(s):     --- Professional ---                        K57.10, Diverticulosis of small intestine without                         perforation or abscess without bleeding                        R13.10, Dysphagia, unspecified                        T18.108A, Unspecified foreign body in esophagus                         causing other injury, initial encounter  K22.2, Esophageal obstruction                        K20.90, Esophagitis, unspecified without bleeding                        T18.128A, Food in esophagus causing other injury,                         initial encounter CPT copyright 2019 [employer] Medical [employer]. All rights reserved. The codes documented in this report are preliminary and upon coder review may  be revised to meet current compliance requirements. Efrain Sella MD, MD 06/30/2021 3:46:54 PM This report has been signed electronically. Number of Addenda: 0 Note Initiated On: 06/30/2021 3:14 PM Estimated Blood Loss:  Estimated blood loss: none.      [facility]

## 2021-07-01 ENCOUNTER — Encounter: Payer: Self-pay | Admitting: Internal Medicine

## 2021-08-20 ENCOUNTER — Other Ambulatory Visit: Payer: Self-pay | Admitting: Internal Medicine

## 2021-08-20 DIAGNOSIS — N1832 Chronic kidney disease, stage 3b: Secondary | ICD-10-CM | POA: Diagnosis present

## 2021-08-20 DIAGNOSIS — R911 Solitary pulmonary nodule: Secondary | ICD-10-CM

## 2021-08-31 ENCOUNTER — Other Ambulatory Visit: Payer: Self-pay

## 2021-08-31 ENCOUNTER — Ambulatory Visit
Admission: RE | Admit: 2021-08-31 | Discharge: 2021-08-31 | Disposition: A | Discharge status: Home / Self Care | Payer: Medicare Other | Source: Ambulatory Visit | Attending: Internal Medicine | Admitting: Internal Medicine

## 2021-08-31 DIAGNOSIS — R911 Solitary pulmonary nodule: Secondary | ICD-10-CM | POA: Insufficient documentation

## 2021-12-06 ENCOUNTER — Other Ambulatory Visit: Payer: Self-pay

## 2021-12-06 DIAGNOSIS — G629 Polyneuropathy, unspecified: Secondary | ICD-10-CM | POA: Diagnosis present

## 2021-12-06 DIAGNOSIS — Z5320 Procedure and treatment not carried out because of patient's decision for unspecified reasons: Secondary | ICD-10-CM | POA: Diagnosis not present

## 2021-12-06 DIAGNOSIS — E871 Hypo-osmolality and hyponatremia: Secondary | ICD-10-CM | POA: Diagnosis present

## 2021-12-06 DIAGNOSIS — Z91048 Other nonmedicinal substance allergy status: Secondary | ICD-10-CM

## 2021-12-06 DIAGNOSIS — N3001 Acute cystitis with hematuria: Secondary | ICD-10-CM | POA: Diagnosis not present

## 2021-12-06 DIAGNOSIS — Z6827 Body mass index (BMI) 27.0-27.9, adult: Secondary | ICD-10-CM

## 2021-12-06 DIAGNOSIS — Z951 Presence of aortocoronary bypass graft: Secondary | ICD-10-CM

## 2021-12-06 DIAGNOSIS — Z85828 Personal history of other malignant neoplasm of skin: Secondary | ICD-10-CM

## 2021-12-06 DIAGNOSIS — Z8249 Family history of ischemic heart disease and other diseases of the circulatory system: Secondary | ICD-10-CM

## 2021-12-06 DIAGNOSIS — E785 Hyperlipidemia, unspecified: Secondary | ICD-10-CM | POA: Diagnosis present

## 2021-12-06 DIAGNOSIS — B9620 Unspecified Escherichia coli [E. coli] as the cause of diseases classified elsewhere: Secondary | ICD-10-CM | POA: Diagnosis present

## 2021-12-06 DIAGNOSIS — R439 Unspecified disturbances of smell and taste: Secondary | ICD-10-CM | POA: Diagnosis present

## 2021-12-06 DIAGNOSIS — Z79899 Other long term (current) drug therapy: Secondary | ICD-10-CM

## 2021-12-06 DIAGNOSIS — G20 Parkinson's disease: Secondary | ICD-10-CM | POA: Diagnosis present

## 2021-12-06 DIAGNOSIS — R54 Age-related physical debility: Secondary | ICD-10-CM | POA: Diagnosis present

## 2021-12-06 DIAGNOSIS — M25511 Pain in right shoulder: Secondary | ICD-10-CM | POA: Diagnosis present

## 2021-12-06 DIAGNOSIS — I119 Hypertensive heart disease without heart failure: Secondary | ICD-10-CM | POA: Diagnosis present

## 2021-12-06 DIAGNOSIS — N390 Urinary tract infection, site not specified: Secondary | ICD-10-CM | POA: Diagnosis not present

## 2021-12-06 DIAGNOSIS — Z7982 Long term (current) use of aspirin: Secondary | ICD-10-CM

## 2021-12-06 DIAGNOSIS — M1990 Unspecified osteoarthritis, unspecified site: Secondary | ICD-10-CM | POA: Diagnosis present

## 2021-12-06 DIAGNOSIS — Z888 Allergy status to other drugs, medicaments and biological substances status: Secondary | ICD-10-CM

## 2021-12-06 DIAGNOSIS — Z96651 Presence of right artificial knee joint: Secondary | ICD-10-CM | POA: Diagnosis present

## 2021-12-06 DIAGNOSIS — I2510 Atherosclerotic heart disease of native coronary artery without angina pectoris: Secondary | ICD-10-CM | POA: Diagnosis present

## 2021-12-06 DIAGNOSIS — G9341 Metabolic encephalopathy: Secondary | ICD-10-CM | POA: Diagnosis present

## 2021-12-06 DIAGNOSIS — R059 Cough, unspecified: Secondary | ICD-10-CM | POA: Diagnosis present

## 2021-12-06 DIAGNOSIS — Z9109 Other allergy status, other than to drugs and biological substances: Secondary | ICD-10-CM

## 2021-12-06 DIAGNOSIS — K219 Gastro-esophageal reflux disease without esophagitis: Secondary | ICD-10-CM | POA: Diagnosis present

## 2021-12-06 DIAGNOSIS — Z8701 Personal history of pneumonia (recurrent): Secondary | ICD-10-CM

## 2021-12-06 DIAGNOSIS — Z28310 Unvaccinated for covid-19: Secondary | ICD-10-CM

## 2021-12-06 DIAGNOSIS — G459 Transient cerebral ischemic attack, unspecified: Secondary | ICD-10-CM | POA: Diagnosis present

## 2021-12-06 DIAGNOSIS — U071 COVID-19: Secondary | ICD-10-CM | POA: Diagnosis present

## 2021-12-06 LAB — CBC
HCT: 39.5 % (ref 36.0–46.0)
Hemoglobin: 12.9 g/dL (ref 12.0–15.0)
MCH: 31.2 pg (ref 26.0–34.0)
MCHC: 32.7 g/dL (ref 30.0–36.0)
MCV: 95.4 fL (ref 80.0–100.0)
Platelets: 181 10*3/uL (ref 150–400)
RBC: 4.14 MIL/uL (ref 3.87–5.11)
RDW: 13.8 % (ref 11.5–15.5)
WBC: 6.1 10*3/uL (ref 4.0–10.5)
nRBC: 0 % (ref 0.0–0.2)

## 2021-12-06 LAB — BASIC METABOLIC PANEL
Anion gap: 8 (ref 5–15)
BUN: 19 mg/dL (ref 8–23)
CO2: 24 mmol/L (ref 22–32)
Calcium: 8.7 mg/dL — ABNORMAL LOW (ref 8.9–10.3)
Chloride: 102 mmol/L (ref 98–111)
Creatinine, Ser: 1.13 mg/dL — ABNORMAL HIGH (ref 0.44–1.00)
GFR, Estimated: 46 mL/min — ABNORMAL LOW (ref >60)
Glucose, Bld: 111 mg/dL — ABNORMAL HIGH (ref 70–99)
Potassium: 4.7 mmol/L (ref 3.5–5.1)
Sodium: 134 mmol/L — ABNORMAL LOW (ref 135–145)

## 2021-12-06 LAB — LACTIC ACID, PLASMA: Lactic Acid, Venous: 1.2 mmol/L (ref 0.5–1.9)

## 2021-12-06 LAB — TROPONIN I (HIGH SENSITIVITY): Troponin I (High Sensitivity): 22 ng/L — ABNORMAL HIGH (ref <18)

## 2021-12-06 NOTE — ED Provider Triage Note (Signed)
Emergency Medicine Provider Triage Evaluation Note  Kimberly Scott , a 88 y.o. female  was evaluated in triage.  Pt complains of weakness and dizziness. Daughter states it has gotten worse over the last month and is not able to get around as well as usual. History of Parkinsons.  Review of Systems  Positive: Weakness, dizziness Negative: Fever  Physical Exam  BP (!) 151/58 (BP Location: Left Arm)    Pulse 69    Temp 99.7 F (37.6 C) (Oral)    Resp 20    SpO2 94%  Gen:   Awake, no distress   Resp:  Normal effort  MSK:   Moves extremities without difficulty  Other:    Medical Decision Making  Medically screening exam initiated at 2:25 PM.  Appropriate orders placed.  Jillene Wehrenberg Jester was informed that the remainder of the evaluation will be completed by another provider, this initial triage assessment does not replace that evaluation, and the importance of remaining in the ED until their evaluation is complete.   Victorino Dike, FNP 12/06/21 1556

## 2021-12-06 NOTE — ED Triage Notes (Signed)
Pt comes via EMS with c/o weakness. Pt states this has gotten worse over the last month. Pt not as mobile as she normally is. Pt does have hx of parkinsons.  162/77 A*OX4 with EMS 98.8 temp HR-75 93% RA

## 2021-12-07 ENCOUNTER — Emergency Department: Payer: Medicare Other

## 2021-12-07 ENCOUNTER — Inpatient Hospital Stay
Admission: EM | Admit: 2021-12-07 | Discharge: 2021-12-11 | DRG: 689 | Disposition: A | Discharge status: Home Health | Payer: Medicare Other | Attending: Internal Medicine | Admitting: Internal Medicine

## 2021-12-07 DIAGNOSIS — G20 Parkinson's disease: Secondary | ICD-10-CM | POA: Diagnosis present

## 2021-12-07 DIAGNOSIS — E785 Hyperlipidemia, unspecified: Secondary | ICD-10-CM | POA: Diagnosis present

## 2021-12-07 DIAGNOSIS — I2510 Atherosclerotic heart disease of native coronary artery without angina pectoris: Secondary | ICD-10-CM | POA: Diagnosis present

## 2021-12-07 DIAGNOSIS — B9620 Unspecified Escherichia coli [E. coli] as the cause of diseases classified elsewhere: Secondary | ICD-10-CM | POA: Diagnosis present

## 2021-12-07 DIAGNOSIS — Z8701 Personal history of pneumonia (recurrent): Secondary | ICD-10-CM | POA: Diagnosis not present

## 2021-12-07 DIAGNOSIS — N3000 Acute cystitis without hematuria: Secondary | ICD-10-CM | POA: Diagnosis not present

## 2021-12-07 DIAGNOSIS — Z5320 Procedure and treatment not carried out because of patient's decision for unspecified reasons: Secondary | ICD-10-CM | POA: Diagnosis not present

## 2021-12-07 DIAGNOSIS — I10 Essential (primary) hypertension: Secondary | ICD-10-CM | POA: Diagnosis not present

## 2021-12-07 DIAGNOSIS — G629 Polyneuropathy, unspecified: Secondary | ICD-10-CM | POA: Diagnosis present

## 2021-12-07 DIAGNOSIS — U071 COVID-19: Secondary | ICD-10-CM | POA: Diagnosis present

## 2021-12-07 DIAGNOSIS — N390 Urinary tract infection, site not specified: Secondary | ICD-10-CM | POA: Diagnosis present

## 2021-12-07 DIAGNOSIS — R531 Weakness: Secondary | ICD-10-CM | POA: Diagnosis not present

## 2021-12-07 DIAGNOSIS — G459 Transient cerebral ischemic attack, unspecified: Secondary | ICD-10-CM | POA: Diagnosis present

## 2021-12-07 DIAGNOSIS — K219 Gastro-esophageal reflux disease without esophagitis: Secondary | ICD-10-CM | POA: Diagnosis present

## 2021-12-07 DIAGNOSIS — Z9109 Other allergy status, other than to drugs and biological substances: Secondary | ICD-10-CM | POA: Diagnosis not present

## 2021-12-07 DIAGNOSIS — Z7982 Long term (current) use of aspirin: Secondary | ICD-10-CM | POA: Diagnosis not present

## 2021-12-07 DIAGNOSIS — M25511 Pain in right shoulder: Secondary | ICD-10-CM | POA: Diagnosis present

## 2021-12-07 DIAGNOSIS — G9341 Metabolic encephalopathy: Secondary | ICD-10-CM | POA: Diagnosis present

## 2021-12-07 DIAGNOSIS — Z951 Presence of aortocoronary bypass graft: Secondary | ICD-10-CM | POA: Diagnosis not present

## 2021-12-07 DIAGNOSIS — Z888 Allergy status to other drugs, medicaments and biological substances status: Secondary | ICD-10-CM | POA: Diagnosis not present

## 2021-12-07 DIAGNOSIS — Z91048 Other nonmedicinal substance allergy status: Secondary | ICD-10-CM | POA: Diagnosis not present

## 2021-12-07 DIAGNOSIS — Z8249 Family history of ischemic heart disease and other diseases of the circulatory system: Secondary | ICD-10-CM | POA: Diagnosis not present

## 2021-12-07 DIAGNOSIS — N3001 Acute cystitis with hematuria: Secondary | ICD-10-CM

## 2021-12-07 DIAGNOSIS — I119 Hypertensive heart disease without heart failure: Secondary | ICD-10-CM | POA: Diagnosis present

## 2021-12-07 DIAGNOSIS — Z79899 Other long term (current) drug therapy: Secondary | ICD-10-CM | POA: Diagnosis not present

## 2021-12-07 DIAGNOSIS — E871 Hypo-osmolality and hyponatremia: Secondary | ICD-10-CM | POA: Diagnosis present

## 2021-12-07 DIAGNOSIS — R439 Unspecified disturbances of smell and taste: Secondary | ICD-10-CM | POA: Diagnosis present

## 2021-12-07 DIAGNOSIS — Z85828 Personal history of other malignant neoplasm of skin: Secondary | ICD-10-CM | POA: Diagnosis not present

## 2021-12-07 LAB — URINALYSIS, ROUTINE W REFLEX MICROSCOPIC
Bilirubin Urine: NEGATIVE
Glucose, UA: NEGATIVE mg/dL
Ketones, ur: NEGATIVE mg/dL
Nitrite: POSITIVE — AB
Protein, ur: NEGATIVE mg/dL
Specific Gravity, Urine: 1.018 (ref 1.005–1.030)
pH: 5 (ref 5.0–8.0)

## 2021-12-07 LAB — BRAIN NATRIURETIC PEPTIDE: B Natriuretic Peptide: 198 pg/mL — ABNORMAL HIGH (ref 0.0–100.0)

## 2021-12-07 LAB — RESP PANEL BY RT-PCR (FLU A&B, COVID) ARPGX2
Influenza A by PCR: NEGATIVE
Influenza B by PCR: NEGATIVE
SARS Coronavirus 2 by RT PCR: POSITIVE — AB

## 2021-12-07 LAB — TROPONIN I (HIGH SENSITIVITY)
Troponin I (High Sensitivity): 24 ng/L — ABNORMAL HIGH (ref <18)
Troponin I (High Sensitivity): 36 ng/L — ABNORMAL HIGH (ref <18)

## 2021-12-07 LAB — PROCALCITONIN
Procalcitonin: <0.1 ng/mL
Procalcitonin: <0.1 ng/mL

## 2021-12-07 LAB — LACTATE DEHYDROGENASE: LDH: 197 U/L — ABNORMAL HIGH (ref 98–192)

## 2021-12-07 LAB — LACTIC ACID, PLASMA
Lactic Acid, Venous: 1.2 mmol/L (ref 0.5–1.9)
Lactic Acid, Venous: 1.5 mmol/L (ref 0.5–1.9)

## 2021-12-07 LAB — FERRITIN: Ferritin: 322 ng/mL — ABNORMAL HIGH (ref 11–307)

## 2021-12-07 LAB — D-DIMER, QUANTITATIVE: D-Dimer, Quant: 3.77 ug/mL-FEU — ABNORMAL HIGH (ref 0.00–0.50)

## 2021-12-07 LAB — C-REACTIVE PROTEIN: CRP: 0.8 mg/dL (ref <1.0)

## 2021-12-07 LAB — FIBRINOGEN: Fibrinogen: 242 mg/dL (ref 210–475)

## 2021-12-07 MED ORDER — ONDANSETRON HCL 4 MG/2ML IJ SOLN: 4.0000 mg | Freq: Four times a day (QID) | INTRAMUSCULAR | Status: DC | PRN

## 2021-12-07 MED ORDER — ASPIRIN EC 81 MG PO TBEC
81.0000 mg | DELAYED_RELEASE_TABLET | Freq: Every day | ORAL | Status: DC
  Administered 2021-12-07 – 2021-12-11 (×5): 81 mg via ORAL
  Filled 2021-12-07 (×5): qty 1

## 2021-12-07 MED ORDER — VITAMIN D 25 MCG (1000 UNIT) PO TABS
1000.0000 [IU] | ORAL_TABLET | Freq: Every day | ORAL | Status: DC
  Administered 2021-12-07 – 2021-12-11 (×5): 1000 [IU] via ORAL
  Filled 2021-12-07 (×5): qty 1

## 2021-12-07 MED ORDER — SODIUM CHLORIDE 0.9 % IV SOLN
100.0000 mg | Freq: Every day | INTRAVENOUS | Status: DC
  Filled 2021-12-07 (×2): qty 20

## 2021-12-07 MED ORDER — SODIUM CHLORIDE 0.9 % IV SOLN
200.0000 mg | Freq: Once | INTRAVENOUS | Status: DC
  Filled 2021-12-07: qty 40

## 2021-12-07 MED ORDER — CALCIUM CARBONATE ANTACID 500 MG PO CHEW: 1.0000 | CHEWABLE_TABLET | Freq: Three times a day (TID) | ORAL | Status: DC

## 2021-12-07 MED ORDER — ASCORBIC ACID 500 MG PO TABS: 500.0000 mg | ORAL_TABLET | Freq: Every day | ORAL | Status: DC

## 2021-12-07 MED ORDER — OMEPRAZOLE MAGNESIUM 20 MG PO TBEC: 20.0000 mg | DELAYED_RELEASE_TABLET | Freq: Every day | ORAL | Status: DC

## 2021-12-07 MED ORDER — ONDANSETRON HCL 4 MG PO TABS: 4.0000 mg | ORAL_TABLET | Freq: Four times a day (QID) | ORAL | Status: DC | PRN

## 2021-12-07 MED ORDER — ATORVASTATIN CALCIUM 20 MG PO TABS
40.0000 mg | ORAL_TABLET | Freq: Every day | ORAL | Status: DC
  Administered 2021-12-07 – 2021-12-11 (×5): 40 mg via ORAL
  Filled 2021-12-07 (×5): qty 2

## 2021-12-07 MED ORDER — ASCORBIC ACID 500 MG PO TABS
1000.0000 mg | ORAL_TABLET | Freq: Every day | ORAL | Status: DC
  Administered 2021-12-07 – 2021-12-11 (×5): 1000 mg via ORAL
  Filled 2021-12-07 (×5): qty 2

## 2021-12-07 MED ORDER — SODIUM CHLORIDE 0.9 % IV SOLN
1.0000 g | INTRAVENOUS | Status: AC
  Administered 2021-12-08 – 2021-12-09 (×2): 1 g via INTRAVENOUS
  Filled 2021-12-07 (×2): qty 1

## 2021-12-07 MED ORDER — SODIUM CHLORIDE 0.9 % IV SOLN: INTRAVENOUS | Status: DC

## 2021-12-07 MED ORDER — ENSURE ENLIVE PO LIQD
237.0000 mL | Freq: Two times a day (BID) | ORAL | Status: DC
  Administered 2021-12-07 – 2021-12-08 (×3): 237 mL via ORAL

## 2021-12-07 MED ORDER — MAGNESIUM HYDROXIDE 400 MG/5ML PO SUSP: 30.0000 mL | Freq: Every day | ORAL | Status: DC | PRN

## 2021-12-07 MED ORDER — LACTATED RINGERS IV BOLUS
1000.0000 mL | Freq: Once | INTRAVENOUS | Status: AC
  Administered 2021-12-07: 1000 mL via INTRAVENOUS

## 2021-12-07 MED ORDER — TRAZODONE HCL 50 MG PO TABS
25.0000 mg | ORAL_TABLET | Freq: Every evening | ORAL | Status: DC | PRN
  Administered 2021-12-10: 25 mg via ORAL
  Filled 2021-12-07: qty 1

## 2021-12-07 MED ORDER — GABAPENTIN 100 MG PO CAPS
100.0000 mg | ORAL_CAPSULE | Freq: Two times a day (BID) | ORAL | Status: DC
  Administered 2021-12-07 (×2): 100 mg via ORAL
  Filled 2021-12-07 (×2): qty 1

## 2021-12-07 MED ORDER — HYDRALAZINE HCL 20 MG/ML IJ SOLN
10.0000 mg | Freq: Four times a day (QID) | INTRAMUSCULAR | Status: DC | PRN
  Administered 2021-12-11: 10 mg via INTRAVENOUS
  Filled 2021-12-07 (×2): qty 1

## 2021-12-07 MED ORDER — SODIUM CHLORIDE 0.9 % IV SOLN
1.0000 g | Freq: Once | INTRAVENOUS | Status: AC
  Administered 2021-12-07: 1 g via INTRAVENOUS
  Filled 2021-12-07: qty 10

## 2021-12-07 MED ORDER — ASPIRIN EC 81 MG PO TBEC: 81.0000 mg | DELAYED_RELEASE_TABLET | Freq: Every day | ORAL | Status: DC

## 2021-12-07 MED ORDER — CARBIDOPA-LEVODOPA 25-100 MG PO TABS
1.0000 | ORAL_TABLET | Freq: Two times a day (BID) | ORAL | Status: DC
  Administered 2021-12-07 – 2021-12-11 (×10): 1 via ORAL
  Filled 2021-12-07 (×11): qty 1

## 2021-12-07 MED ORDER — ENOXAPARIN SODIUM 40 MG/0.4ML IJ SOSY
40.0000 mg | PREFILLED_SYRINGE | INTRAMUSCULAR | Status: DC
  Administered 2021-12-07 – 2021-12-11 (×5): 40 mg via SUBCUTANEOUS
  Filled 2021-12-07 (×5): qty 0.4

## 2021-12-07 MED ORDER — MENTHOL 3 MG MT LOZG
1.0000 | LOZENGE | OROMUCOSAL | Status: DC | PRN
  Filled 2021-12-07: qty 9

## 2021-12-07 MED ORDER — ZINC SULFATE 220 (50 ZN) MG PO CAPS
220.0000 mg | ORAL_CAPSULE | Freq: Every day | ORAL | Status: DC
  Administered 2021-12-07 – 2021-12-11 (×5): 220 mg via ORAL
  Filled 2021-12-07 (×5): qty 1

## 2021-12-07 MED ORDER — IRBESARTAN 150 MG PO TABS
75.0000 mg | ORAL_TABLET | Freq: Every day | ORAL | Status: DC
  Administered 2021-12-07 – 2021-12-11 (×5): 75 mg via ORAL
  Filled 2021-12-07 (×5): qty 1

## 2021-12-07 MED ORDER — CARBIDOPA-LEVODOPA 25-100 MG PO TABS
2.0000 | ORAL_TABLET | Freq: Every morning | ORAL | Status: DC
  Administered 2021-12-08 – 2021-12-11 (×4): 2 via ORAL
  Filled 2021-12-07 (×5): qty 2

## 2021-12-07 MED ORDER — AZELASTINE HCL 0.1 % NA SOLN: 1.0000 | Freq: Two times a day (BID) | NASAL | Status: DC

## 2021-12-07 MED ORDER — ACETAMINOPHEN 325 MG PO TABS
650.0000 mg | ORAL_TABLET | Freq: Four times a day (QID) | ORAL | Status: DC | PRN
  Filled 2021-12-07: qty 2

## 2021-12-07 NOTE — ED Notes (Signed)
Pt given blankets and cups.

## 2021-12-07 NOTE — ED Notes (Signed)
Pt placed on the bedpan

## 2021-12-07 NOTE — ED Notes (Signed)
Pt assisted to lay on her left side and blankets placed behind her back for support

## 2021-12-07 NOTE — H&P (Addendum)
[place]   PATIENT NAME: Kimberly Scott    MR#:  [id number]  DATE OF BIRTH:  [birth year]  DATE OF ADMISSION:  12/07/2021  PRIMARY CARE PHYSICIAN: Adin Hector, MD   Patient is coming from: Home  REQUESTING/REFERRING PHYSICIAN: Rudene Re, MD  CHIEF COMPLAINT:   Chief Complaint  Patient presents with   Weakness    HISTORY OF PRESENT ILLNESS:  Kimberly Scott is a 88 y.o. female with medical history significant for osteoarthritis, hypertension and angina, who presented to the ER with acute onset of cough productive of whitish sputum for the last couple of days with associated mild dyspnea and wheezing as well as sore throat with postnasal drip, fever and chills.  She denies any loss of taste.  She has chronic loss of smell.  She admits to mild lower abdominal pain with cough.  She has mild lightheadedness without headache or paresthesias or focal muscle weakness.  No chest pain or palpitations.  No dysuria, oliguria or hematuria or flank pain.  ED Course: When she came to the ER, blood pressure was 151/58 with otherwise normal vital signs.  Labs revealed mild hyponatremia 134 with a creatinine 1.13 and a BN of 19.  High-sensitivity troponin I was 22 and later 24 and lactic acid was 1.2.  Procalcitonin was less than 0.1 and CBC was within normal.  UA was positive for UTI.  Urine culture was sent. EKG as reviewed by me : EKG showed likely sinus rhythm with a rate of 72 with left axis deviation and low voltage QRS Imaging: Portable chest ray showed no acute cardiopulmonary disease.  Noncontrasted CT scan showed confluent chronic small vessel ischemia with no acute findings.  The patient was given 1 g of IV Rocephin and 1 L bolus of IV lactated Ringer.  She will be admitted to a medical telemetry bed for further evaluation and management PAST MEDICAL HISTORY:   Past Medical History:  Diagnosis Date   Anginal pain ([place])    Arthritis    Cancer ([place])    FACIAL  SKIN CANCER   Coronary artery disease    Headache(784.0)    Heart murmur    Hypertension    Pneumonia    hx of PNA    PAST SURGICAL HISTORY:   Past Surgical History:  Procedure Laterality Date   ABDOMINAL HYSTERECTOMY     APPENDECTOMY     cataracts     CORONARY ARTERY BYPASS GRAFT  10/01/2012   Procedure: CORONARY ARTERY BYPASS GRAFTING (CABG);  Surgeon: Grace Isaac, MD;  Location: [facility];  Service: Open Heart Surgery;  Laterality: N/A;  times five using Left Internal Mammary Artery    ENDOVEIN HARVEST OF GREATER SAPHENOUS VEIN  10/01/2012   Procedure: ENDOVEIN HARVEST OF GREATER SAPHENOUS VEIN;  Surgeon: Grace Isaac, MD;  Location: [facility];  Service: Open Heart Surgery;  Laterality: Bilateral;   ESOPHAGOGASTRODUODENOSCOPY N/A 06/30/2021   Procedure: ESOPHAGOGASTRODUODENOSCOPY (EGD);  Surgeon: Toledo, Benay Pike, MD;  Location: [facility] ENDOSCOPY;  Service: Gastroenterology;  Laterality: N/A;   KNEE SURGERY     TONSILLECTOMY     TOTAL KNEE ARTHROPLASTY     right knee    SOCIAL HISTORY:   Social History   Tobacco Use   Smoking status: Never   Smokeless tobacco: Never  Substance Use Topics   Alcohol use: No    FAMILY HISTORY:   Positive for brain tumor and hypertension.  DRUG ALLERGIES:   Allergies  Allergen Reactions   Bacitracin-Neomycin-Polymyxin Swelling and Rash    Blisters Blisters    Ace Inhibitors     Other reaction(s): Cough   Omega 3  [Fish Oil] Other (See Comments)   Neosporin [Neomycin-Bacitracin Zn-Polymyx] Rash   Tape Rash    Blisters    REVIEW OF SYSTEMS:   ROS As per history of present illness. All pertinent systems were reviewed above. Constitutional, HEENT, cardiovascular, respiratory, GI, GU, musculoskeletal, neuro, psychiatric, endocrine, integumentary and hematologic systems were reviewed and are otherwise negative/unremarkable except for positive findings mentioned above in the HPI.   MEDICATIONS AT HOME:   Prior to Admission  medications   Medication Sig Start Date End Date Taking? Authorizing Provider  ascorbic acid (VITAMIN C) 500 MG tablet Take 1,000 mg by mouth daily.    [provider]  aspirin EC 81 MG tablet Take 81 mg by mouth daily.    [provider]  atorvastatin (LIPITOR) 40 MG tablet Take 1 tablet (40 mg total) by mouth daily at 6 PM. 05/23/18   Leslye Peer, Richard, MD  azelastine (ASTELIN) 0.1 % nasal spray Place 1 spray into both nostrils 2 (two) times daily. Use in each nostril as directed    [provider]  calcium carbonate (TUMS - DOSED IN MG ELEMENTAL CALCIUM) 500 MG chewable tablet Chew 1 tablet (200 mg of elemental calcium total) by mouth 3 (three) times daily. 08/01/19   Loletha Grayer, MD  carbidopa-levodopa (SINEMET IR) 25-100 MG tablet Take 2 tablets by mouth as directed. Take 2 tabs in the morning and 1 tablet at noon and dinner time    [provider]  cholecalciferol (VITAMIN D) 1000 units tablet Take 1,000 Units by mouth daily.    [provider]  cyanocobalamin (,VITAMIN B-12,) 1000 MCG/ML injection Inject 1,000 mcg into the muscle every 30 (thirty) days. 11/21/21   [provider]  feeding supplement, ENSURE ENLIVE, (ENSURE ENLIVE) LIQD Take 237 mLs by mouth 2 (two) times daily between meals. 08/01/19   Loletha Grayer, MD  gabapentin (NEURONTIN) 100 MG capsule Take 100 mg by mouth 2 (two) times daily.    [provider]  omeprazole (PRILOSEC OTC) 20 MG tablet Take 1 tablet (20 mg total) by mouth daily. 06/30/21 06/30/22  Efrain Sella, MD  omeprazole (PRILOSEC) 20 MG capsule Take 20 mg by mouth daily. 07/06/21   [provider]  ondansetron (ZOFRAN) 4 MG tablet Take 1 tablet (4 mg total) by mouth every 6 (six) hours as needed for nausea. 08/01/19   Loletha Grayer, MD  telmisartan (MICARDIS) 20 MG tablet Take 40 mg by mouth daily.    [provider]  triamcinolone ointment (KENALOG) 0.1 % Apply 1 application  topically 2 (two) times daily as needed.    [provider]      VITAL SIGNS:  Blood pressure (!) 175/64, pulse (!) 59, temperature 99.8 F (37.7 C), temperature source Oral, resp. rate 18, SpO2 93 %.  PHYSICAL EXAMINATION:  Physical Exam  GENERAL:  88 y.o.-year-old patient lying in the bed with no acute distress.  EYES: Pupils equal, round, reactive to light and accommodation. No scleral icterus. Extraocular muscles intact.  HEENT: Head atraumatic, normocephalic. Oropharynx and nasopharynx clear.  NECK:  Supple, no jugular venous distention. No thyroid enlargement, no tenderness.  LUNGS: Normal breath sounds bilaterally, no wheezing, rales,rhonchi or crepitation. No use of accessory muscles of respiration.  CARDIOVASCULAR: Regular rate and rhythm, S1, S2 normal. No murmurs, rubs, or gallops.  ABDOMEN: Soft, nondistended, nontender. Bowel sounds present. No organomegaly or mass.  EXTREMITIES: No pedal edema, cyanosis, or clubbing.  NEUROLOGIC: Cranial nerves II through XII are intact. Muscle strength 5/5 in all extremities. Sensation intact. Gait not checked.  PSYCHIATRIC: The patient is alert and oriented x 3.  Normal affect and good eye contact. SKIN: No obvious rash, lesion, or ulcer.   LABORATORY PANEL:   CBC Recent Labs  Lab 12/06/21 1416  WBC 6.1  HGB 12.9  HCT 39.5  PLT 181   ------------------------------------------------------------------------------------------------------------------  Chemistries  Recent Labs  Lab 12/06/21 2151  NA 134*  K 4.7  CL 102  CO2 24  GLUCOSE 111*  BUN 19  CREATININE 1.13*  CALCIUM 8.7*   ------------------------------------------------------------------------------------------------------------------  Cardiac Enzymes No results for input(s): TROPONINI in the last 168 hours. ------------------------------------------------------------------------------------------------------------------  RADIOLOGY:  CT Head Wo  Contrast  Result Date: 12/07/2021 CLINICAL DATA:  Mental status change with unknown cause EXAM: CT HEAD WITHOUT CONTRAST TECHNIQUE: Contiguous axial images were obtained from the base of the skull through the vertex without intravenous contrast. COMPARISON:  07/19/2019 FINDINGS: Brain: No evidence of acute infarction, hemorrhage, hydrocephalus, extra-axial collection or mass lesion/mass effect. Low-density in the hemispheric white matter that is confluent. Mild for age cerebral volume loss. Vascular: No hyperdense vessel or unexpected calcification. Skull: Normal. Negative for fracture or focal lesion. Sinuses/Orbits: No acute finding. IMPRESSION: No acute or reversible finding. Confluent chronic small vessel ischemia. Electronically Signed   By: Jorje Guild M.D.   On: 12/07/2021 06:33   DG Chest Portable 1 View  Result Date: 12/07/2021 CLINICAL DATA:  Hypoxia EXAM: PORTABLE CHEST 1 VIEW COMPARISON:  06/30/2021 FINDINGS: Cardiomegaly and aortic tortuosity. CABG. There is no edema, consolidation, effusion, or pneumothorax. IMPRESSION: No acute finding when compared to prior. Electronically Signed   By: Jorje Guild M.D.   On: 12/07/2021 05:32      IMPRESSION AND PLAN:  Principal Problem:   UTI (urinary tract infection)  1.  COVID-19 infection with generalized weakness. - The patient will be admitted to a medical telemetry bed. - We will continue her on IV remdesivir. - We will follow inflammatory markers. - Vitamin D3, vitamin C and zinc sulfate as well as aspirin will be given. - Mucolytic therapy will be provided. - She will be hydrated with IV normal saline.  2.  UTI. - The patient will be continued on IV Rocephin. - We will follow urine culture and sensitivity.  3.  Uncontrolled hypertension. - We will continue her antihypertensives and place her on as needed IV hydralazine.  4.  Peripheral neuropathy. - We will continue Neurontin.  5.  Dyslipidemia. - We will continue  statin therapy.  DVT prophylaxis: Lovenox. Code Status: full code. Family Communication:  The plan of care was discussed in details with the patient (and family). I answered all questions. The patient agreed to proceed with the above mentioned plan. Further management will depend upon hospital course. Disposition Plan: Back to previous home environment Consults called: none. All the records are reviewed and case discussed with ED provider.  Status is: Inpatient   At the time of the admission, it appears that the appropriate admission status for this patient is inpatient.  This is judged to be reasonable and necessary in order to provide the required intensity of service to ensure the patient's safety given the presenting symptoms, physical exam findings and initial radiographic and laboratory data in the context of comorbid conditions.  The patient requires inpatient status  due to high intensity of service, high risk of further deterioration and high frequency of surveillance required.  I certify that at the time of admission, it is my clinical judgment that the patient will require inpatient hospital care extending more than 2 midnights.                            Dispo: The patient is from: Home              Anticipated d/c is to: Home              Patient currently is not medically stable to d/c.              Difficult to place patient: No      Christel Mormon M.D on 12/07/2021 at 8:40 AM  [facility]   From 7 PM-7 AM, contact night-coverage [email or web address]  CC: Primary care physician; Adin Hector, MD

## 2021-12-07 NOTE — ED Provider Notes (Signed)
[facility] Provider Note    Event Date/Time   First MD Initiated Contact with Patient 12/07/21 [phone]     (approximate)   History   Weakness   HPI  Kimberly Scott is a 88 y.o. female with a history of Parkinson's disease, CAD status post CABG, hypertension who presents for generalized weakness.  History is gathered from patient and her daughter who is at bedside.  Patient lives at home alone, drives and is very independent.  She was doing well until 2 days ago.  Over the last 2 days she has had progressively worsening generalized weakness to the point that she is unable to ambulate without significant assistance at this time.  She has had a cough and a fever of 101F at home.  Patient denies headache, chest pain, shortness of breath, abdominal pain.  She has had nausea and decreased oral intake but no vomiting.  Last BM was 2 days ago.  She has had some dysuria.  No unilateral weakness or numbness, no slurred speech, no facial droop     Past Medical History:  Diagnosis Date   Anginal pain ([place])    Arthritis    Cancer ([place])    FACIAL SKIN CANCER   Coronary artery disease    Headache(784.0)    Heart murmur    Hypertension    Pneumonia    hx of PNA    Past Surgical History:  Procedure Laterality Date   ABDOMINAL HYSTERECTOMY     APPENDECTOMY     cataracts     CORONARY ARTERY BYPASS GRAFT  10/01/2012   Procedure: CORONARY ARTERY BYPASS GRAFTING (CABG);  Surgeon: Grace Isaac, MD;  Location: [facility];  Service: Open Heart Surgery;  Laterality: N/A;  times five using Left Internal Mammary Artery    ENDOVEIN HARVEST OF GREATER SAPHENOUS VEIN  10/01/2012   Procedure: ENDOVEIN HARVEST OF GREATER SAPHENOUS VEIN;  Surgeon: Grace Isaac, MD;  Location: [facility];  Service: Open Heart Surgery;  Laterality: Bilateral;   ESOPHAGOGASTRODUODENOSCOPY N/A 06/30/2021   Procedure: ESOPHAGOGASTRODUODENOSCOPY (EGD);  Surgeon: Toledo, Benay Pike, MD;  Location: [facility]  ENDOSCOPY;  Service: Gastroenterology;  Laterality: N/A;   KNEE SURGERY     TONSILLECTOMY     TOTAL KNEE ARTHROPLASTY     right knee     Physical Exam   Triage Vital Signs: ED Triage Vitals  Enc Vitals Group     BP 12/06/21 1416 (!) 151/58     Pulse Rate 12/06/21 1416 69     Resp 12/06/21 1416 20     Temp 12/06/21 1416 99.7 F (37.6 C)     Temp Source 12/06/21 1416 Oral     SpO2 12/06/21 1416 94 %     Weight --      Height --      Head Circumference --      Peak Flow --      Pain Score 12/06/21 1348 0     Pain Loc --      Pain Edu? --      Excl. in [place]? --     Most recent vital signs: Vitals:   12/06/21 1416 12/07/21 0138  BP: (!) 151/58 (!) 181/76  Pulse: 69 67  Resp: 20 20  Temp: 99.7 F (37.6 C) 99.8 F (37.7 C)  SpO2: 94% 98%     Constitutional: Alert and oriented, looks frail and weak but in no distress HEENT:      Head: Normocephalic and  atraumatic.         Eyes: Conjunctivae are normal. Sclera is non-icteric.       Mouth/Throat: Mucous membranes are moist.       Neck: Supple with no signs of meningismus. Cardiovascular: Regular rate and rhythm. No murmurs, gallops, or rubs. 2+ symmetrical distal pulses are present in all extremities.  Respiratory: Normal respiratory effort. Lungs are clear to auscultation bilaterally.  Gastrointestinal: Soft, non tender, and non distended with positive bowel sounds. No rebound or guarding. Genitourinary: No CVA tenderness. Musculoskeletal:  No edema, cyanosis, or erythema of extremities. Neurologic: Normal speech and language. Face is symmetric. Moving all extremities. No gross focal neurologic deficits are appreciated. Skin: Skin is warm, dry and intact. No rash noted. Psychiatric: Mood and affect are normal. Speech and behavior are normal.  ED Results / Procedures / Treatments   Labs (all labs ordered are listed, but only abnormal results are displayed) Labs Reviewed  BASIC METABOLIC PANEL - Abnormal; Notable for  the following components:      Result Value   Sodium 134 (*)    Glucose, Bld 111 (*)    Creatinine, Ser 1.13 (*)    Calcium 8.7 (*)    GFR, Estimated 46 (*)    All other components within normal limits  URINALYSIS, ROUTINE W REFLEX MICROSCOPIC - Abnormal; Notable for the following components:   Color, Urine YELLOW (*)    APPearance HAZY (*)    Hgb urine dipstick SMALL (*)    Nitrite POSITIVE (*)    Leukocytes,Ua SMALL (*)    Bacteria, UA MANY (*)    All other components within normal limits  TROPONIN I (HIGH SENSITIVITY) - Abnormal; Notable for the following components:   Troponin I (High Sensitivity) 22 (*)    All other components within normal limits  TROPONIN I (HIGH SENSITIVITY) - Abnormal; Notable for the following components:   Troponin I (High Sensitivity) 24 (*)    All other components within normal limits  RESP PANEL BY RT-PCR (FLU A&B, COVID) ARPGX2  URINE CULTURE  CULTURE, BLOOD (SINGLE)  CBC  LACTIC ACID, PLASMA  LACTIC ACID, PLASMA  LACTIC ACID, PLASMA  PROCALCITONIN  CBG MONITORING, ED     EKG  ED ECG REPORT I, Rudene Re, the attending physician, personally viewed and interpreted this ECG.  Sinus rhythm with a rate of 72, normal intervals, left axis deviation, no ST elevations or depressions   RADIOLOGY I have personally reviewed the images performed during this visit and interpret them as below:  CT of the head with no signs of acute bleed or stroke  Chest x-ray with no signs of edema or pneumonia   ___________________________________________________ Interpretation by Radiologist:  CT Head Wo Contrast  Result Date: 12/07/2021 CLINICAL DATA:  Mental status change with unknown cause EXAM: CT HEAD WITHOUT CONTRAST TECHNIQUE: Contiguous axial images were obtained from the base of the skull through the vertex without intravenous contrast. COMPARISON:  07/19/2019 FINDINGS: Brain: No evidence of acute infarction, hemorrhage, hydrocephalus,  extra-axial collection or mass lesion/mass effect. Low-density in the hemispheric white matter that is confluent. Mild for age cerebral volume loss. Vascular: No hyperdense vessel or unexpected calcification. Skull: Normal. Negative for fracture or focal lesion. Sinuses/Orbits: No acute finding. IMPRESSION: No acute or reversible finding. Confluent chronic small vessel ischemia. Electronically Signed   By: Jorje Guild M.D.   On: 12/07/2021 06:33   DG Chest Portable 1 View  Result Date: 12/07/2021 CLINICAL DATA:  Hypoxia EXAM: PORTABLE CHEST 1  VIEW COMPARISON:  06/30/2021 FINDINGS: Cardiomegaly and aortic tortuosity. CABG. There is no edema, consolidation, effusion, or pneumothorax. IMPRESSION: No acute finding when compared to prior. Electronically Signed   By: Jorje Guild M.D.   On: 12/07/2021 05:32      PROCEDURES:  Critical Care performed: No  Procedures    IMPRESSION / MDM / ASSESSMENT AND PLAN / ED COURSE  I reviewed the triage vital signs and the nursing notes.  88 y.o. female with a history of Parkinson's disease, CAD status post CABG, hypertension who presents for generalized weakness.  Patient lives alone, drives and is very independent.  Over the last 2 days has had progressively worsening generalized weakness and now unable to even get up from the bed without assistance.  Has had a fever, cough, and some dysuria at home.  Also nausea.  Patient looks frail but in no significant distress, dry mucous membranes, low-grade temp of 99.73F hypertensive in the setting of missed blood pressure medications this morning and this evening because she was in the waiting room.  Abdomen soft and nontender.  No neurodeficits.  Normal work of breathing normal sats with clear lungs.  Ddx: UTI versus pneumonia versus COVID versus flu versus dehydration versus AKI versus stroke   Plan: CBC, chemistry panel, urinalysis, COVID/flu swab, chest x-ray, urinalysis, EKG.  Patient placed on telemetry  for monitoring of cardiorespiratory status.   MEDICATIONS GIVEN IN ED: Medications  cefTRIAXone (ROCEPHIN) 1 g in sodium chloride 0.9 % 100 mL IVPB (has no administration in time range)  lactated ringers bolus 1,000 mL (has no administration in time range)     ED COURSE: UA positive for UTI.  Review of epic shows the patient has grown E. coli pansensitive in the past.  Urine has been sent for culture and Rocephin has been ordered.  Mildly elevated troponin at 22 and 24 with EKG showing no signs of ischemia.  Patient denies chest pain or shortness of breath.  Possibly from mild AKI.  No significant electrolyte derangements.  Normal white count and lactic acidosis with no signs of sepsis.  Chest x-ray with no signs of pneumonia or edema.  Head CT with no signs of intracranial bleed or stroke.  Covid and flu pending.  I consulted the hospitalist service and after discussion they accepted patient to their service   Consults: Hospitalist   EMR reviewed including patient's last visit with her neurologist for Parkinson's disease in October 2022       FINAL CLINICAL IMPRESSION(S) / ED DIAGNOSES   Final diagnoses:  Generalized weakness  Acute cystitis with hematuria     Rx / DC Orders   ED Discharge Orders     None        Note:  This document was prepared using Dragon voice recognition software and may include unintentional dictation errors.    Alfred Levins, [place], MD 12/07/21 [phone]

## 2021-12-08 ENCOUNTER — Encounter: Payer: Self-pay | Admitting: Family Medicine

## 2021-12-08 DIAGNOSIS — N3000 Acute cystitis without hematuria: Secondary | ICD-10-CM

## 2021-12-08 DIAGNOSIS — G9341 Metabolic encephalopathy: Secondary | ICD-10-CM | POA: Diagnosis not present

## 2021-12-08 DIAGNOSIS — U071 COVID-19: Secondary | ICD-10-CM | POA: Diagnosis present

## 2021-12-08 DIAGNOSIS — G629 Polyneuropathy, unspecified: Secondary | ICD-10-CM

## 2021-12-08 DIAGNOSIS — I10 Essential (primary) hypertension: Secondary | ICD-10-CM | POA: Diagnosis not present

## 2021-12-08 DIAGNOSIS — G20 Parkinson's disease: Secondary | ICD-10-CM | POA: Diagnosis present

## 2021-12-08 LAB — CBC WITH DIFFERENTIAL/PLATELET
Abs Immature Granulocytes: 0.03 10*3/uL (ref 0.00–0.07)
Basophils Absolute: 0 10*3/uL (ref 0.0–0.1)
Basophils Relative: 0 %
Eosinophils Absolute: 0 10*3/uL (ref 0.0–0.5)
Eosinophils Relative: 0 %
HCT: 34.4 % — ABNORMAL LOW (ref 36.0–46.0)
Hemoglobin: 11.5 g/dL — ABNORMAL LOW (ref 12.0–15.0)
Immature Granulocytes: 1 %
Lymphocytes Relative: 26 %
Lymphs Abs: 1.4 10*3/uL (ref 0.7–4.0)
MCH: 30.1 pg (ref 26.0–34.0)
MCHC: 33.4 g/dL (ref 30.0–36.0)
MCV: 90.1 fL (ref 80.0–100.0)
Monocytes Absolute: 1.1 10*3/uL — ABNORMAL HIGH (ref 0.1–1.0)
Monocytes Relative: 21 %
Neutro Abs: 2.9 10*3/uL (ref 1.7–7.7)
Neutrophils Relative %: 52 %
Platelets: 159 10*3/uL (ref 150–400)
RBC: 3.82 MIL/uL — ABNORMAL LOW (ref 3.87–5.11)
RDW: 13.8 % (ref 11.5–15.5)
WBC: 5.5 10*3/uL (ref 4.0–10.5)
nRBC: 0 % (ref 0.0–0.2)

## 2021-12-08 LAB — COMPREHENSIVE METABOLIC PANEL
ALT: 10 U/L (ref 0–44)
AST: 45 U/L — ABNORMAL HIGH (ref 15–41)
Albumin: 3 g/dL — ABNORMAL LOW (ref 3.5–5.0)
Alkaline Phosphatase: 58 U/L (ref 38–126)
Anion gap: 10 (ref 5–15)
BUN: 17 mg/dL (ref 8–23)
CO2: 23 mmol/L (ref 22–32)
Calcium: 7.6 mg/dL — ABNORMAL LOW (ref 8.9–10.3)
Chloride: 99 mmol/L (ref 98–111)
Creatinine, Ser: 0.91 mg/dL (ref 0.44–1.00)
GFR, Estimated: 60 mL/min — ABNORMAL LOW (ref >60)
Glucose, Bld: 103 mg/dL — ABNORMAL HIGH (ref 70–99)
Potassium: 3.8 mmol/L (ref 3.5–5.1)
Sodium: 132 mmol/L — ABNORMAL LOW (ref 135–145)
Total Bilirubin: 0.5 mg/dL (ref 0.3–1.2)
Total Protein: 6 g/dL — ABNORMAL LOW (ref 6.5–8.1)

## 2021-12-08 LAB — FERRITIN: Ferritin: 297 ng/mL (ref 11–307)

## 2021-12-08 LAB — C-REACTIVE PROTEIN: CRP: 0.7 mg/dL (ref <1.0)

## 2021-12-08 LAB — PROCALCITONIN: Procalcitonin: <0.1 ng/mL

## 2021-12-08 LAB — D-DIMER, QUANTITATIVE: D-Dimer, Quant: 0.92 ug/mL-FEU — ABNORMAL HIGH (ref 0.00–0.50)

## 2021-12-08 MED ORDER — GABAPENTIN 100 MG PO CAPS
100.0000 mg | ORAL_CAPSULE | Freq: Every day | ORAL | Status: DC
  Administered 2021-12-09 – 2021-12-10 (×2): 100 mg via ORAL
  Filled 2021-12-08 (×2): qty 1

## 2021-12-08 MED ORDER — ENSURE ENLIVE PO LIQD
237.0000 mL | Freq: Three times a day (TID) | ORAL | Status: DC
  Administered 2021-12-09 – 2021-12-10 (×4): 237 mL via ORAL

## 2021-12-08 MED ORDER — PHENOL 1.4 % MT LIQD
1.0000 | OROMUCOSAL | Status: DC | PRN
  Administered 2021-12-08: 1 via OROMUCOSAL
  Filled 2021-12-08 (×2): qty 177

## 2021-12-08 MED ORDER — ADULT MULTIVITAMIN W/MINERALS CH
1.0000 | ORAL_TABLET | Freq: Every day | ORAL | Status: DC
  Administered 2021-12-09 – 2021-12-11 (×3): 1 via ORAL
  Filled 2021-12-08 (×3): qty 1

## 2021-12-08 NOTE — Assessment & Plan Note (Addendum)
Present on admission, UA was consistent with infection and patient had suprapubic abdominal pain that has improved with antibiotics. Urine culture grew pansensitive E. coli. -- Transitioned from IV Rocephin to oral Keflex to complete 5-day course

## 2021-12-08 NOTE — Assessment & Plan Note (Signed)
Continue statin. 

## 2021-12-08 NOTE — Assessment & Plan Note (Signed)
Continue gabapentin.

## 2021-12-08 NOTE — Assessment & Plan Note (Signed)
Continue Sinemet ?

## 2021-12-08 NOTE — Assessment & Plan Note (Signed)
On aspirin and statin.  No acute neurologic symptoms.

## 2021-12-08 NOTE — Assessment & Plan Note (Signed)
Present on admission, likely due to COVID infection and UTI.  Mental status has improved.   -- Delirium precautions -- Treat UTI

## 2021-12-08 NOTE — Hospital Course (Addendum)
88 y.o. female with medical history significant for osteoarthritis, hypertension and angina, who presented to the ER with acute onset of cough productive of whitish sputum for the last couple of days with associated mild dyspnea and wheezing as well as sore throat with postnasal drip, fever and chills.  She denies any loss of taste.  She has chronic loss of smell.  She admits to mild lower abdominal pain with cough.   Workup in the ED revealed UA consistent with UTI, sent for culture.  Covid-19 PCR was positive.  Remdesivir was ordered, but declined by patient and family (patient also unvaccinated, family honoring her wishes to avoid vaccines or covid treatments).  Chest xray without PNA.  Started on empiric Rocephin and admitted to [facility] service.

## 2021-12-08 NOTE — Progress Notes (Signed)
Progress Note   Patient: Kimberly Scott WUX:[id number] DOB: Nov 21, 1928 DOA: 12/07/2021     1 DOS: the patient was seen and examined on 12/08/2021   Brief hospital course: 88 y.o. female with medical history significant for osteoarthritis, hypertension and angina, who presented to the ER with acute onset of cough productive of whitish sputum for the last couple of days with associated mild dyspnea and wheezing as well as sore throat with postnasal drip, fever and chills.  She denies any loss of taste.  She has chronic loss of smell.  She admits to mild lower abdominal pain with cough.   Workup in the ED revealed UA consistent with UTI, sent for culture.  Covid-19 PCR was positive.  Remdesivir was ordered, but declined by patient and family (patient also unvaccinated, family honoring her wishes to avoid vaccines or covid treatments).  Chest xray without PNA.  Started on empiric Rocephin and admitted to [facility] service.  Assessment and Plan * UTI (urinary tract infection)- (present on admission) Present on admission, UA was consistent with infection and patient had suprapubic abdominal pain that has improved with antibiotics. --Continue empiric Rocephin. --Follow urine culture   Acute metabolic encephalopathy- (present on admission) Present on admission, likely due to COVID infection and UTI.  Mental status has improved.   -- Delirium precautions -- Treat UTI  COVID-19 virus infection- (present on admission) Present on admission.  Chest x-ray negative for pneumonia patient and family declined remdesivir which was ordered on admission.  Per daughter, patient has declined all vaccines and COVID-specific treatment and they want to honor that wish. Patient is having headaches and a dry cough, mildly symptomatic -- Supportive care -- Monitor respiratory status and repeat chest x-ray if worsening   Hypertension- (present on admission) BP uncontrolled on admission. Continue  antihypertensives As needed hydralazine  Peripheral neuropathy Continue gabapentin  Hyperlipidemia- (present on admission) Continue statin  GERD (gastroesophageal reflux disease)- (present on admission) Appears not on a PPI.  Monitor  TIA (transient ischemic attack)- (present on admission) On aspirin and statin.  No acute neurologic symptoms.  Parkinson's disease ([place])- (present on admission) Continue Sinemet  Coronary arteriosclerosis- (present on admission) Chronic, stable. Continue ASA, statin     Subjective: Patient awake resting in bed with daughter at bedside when seen today.  She reports having recurrent headaches recently.  Having a mostly dry cough.  She was having significant suprapubic abdominal that has improved since admission.  No fevers chills or other acute complaints at this time.  Objective Vitals reviewed and notable for temperature at 3:30 AM 100 F, placed on 1 L/min nasal cannula O2 around 8 AM, significant hypertension on admission has improved with systolic BP in the 253G this morning.  General exam: awake, alert, no acute distress HEENT: atraumatic, clear conjunctiva, anicteric sclera, moist mucus membranes, hearing grossly normal  Respiratory system: CTAB, no wheezes, rales or rhonchi, normal respiratory effort. Cardiovascular system: normal S1/S2,  RRR, no JVD, murmurs, rubs, gallops, nonpitting lower extremity edema.   Gastrointestinal system: soft, NT, ND, no HSM felt, +bowel sounds. Central nervous system: A&O x3. no gross focal neurologic deficits, normal speech Extremities: Right upper extremity pill-rolling tremor at rest, no edema, normal tone Skin: dry, intact, normal temperature Psychiatry: normal mood, flat affect  Data Reviewed:  Labs reviewed and notable for sodium 132, glucose 103, calcium 7.6, albumin 3.0, AST 45, BNP 198, LDH 197, troponin 36, negative procalcitonin x2, hemoglobin 11.5, D-dimer improved from 3.77 down to  0.92  Family  Communication: Daughter at bedside on rounds  Disposition: Status is: Inpatient  Remains inpatient appropriate because: Severity of illness on IV antibiotics for UTI pending culture results         Time spent: 35 minutes  Author: Ezekiel Slocumb, DO 12/08/2021 7:32 PM  For on call review [email or web address].

## 2021-12-08 NOTE — Plan of Care (Signed)

## 2021-12-08 NOTE — Assessment & Plan Note (Addendum)
BP uncontrolled on admission. Continue antihypertensives As needed hydralazine. 1/13: BP fairly stable with soft diastolics in the 65H, systolic in the 846-962X.

## 2021-12-08 NOTE — Progress Notes (Signed)
Patient ambulated to bathroom with one assist and front wheel walker. Patient able to maintain O2 sats above 94% throughout ambulation; no shortness of breath noted. Daughter states patient has multiple walkers and a cane she uses at home.

## 2021-12-08 NOTE — Assessment & Plan Note (Signed)
Appears not on a PPI.  Monitor

## 2021-12-08 NOTE — Progress Notes (Signed)
Initial Nutrition Assessment  DOCUMENTATION CODES:   Not applicable  INTERVENTION:   Ensure Enlive po TID, each supplement provides 350 kcal and 20 grams of protein  MVI po daily   Liberalize diet   Pt at high refeed risk; recommend monitor potassium, magnesium and phosphorus labs daily until stable  NUTRITION DIAGNOSIS:   Inadequate oral intake related to acute illness as evidenced by per patient/family report.  GOAL:   Patient will meet greater than or equal to 90% of their needs  MONITOR:   PO intake, Supplement acceptance, Labs, Weight trends, Skin, I & O's  REASON FOR ASSESSMENT:   Malnutrition Screening Tool    ASSESSMENT:   88 y/o female with h/o Parkinson's disease, CAD s/p CABG and HTN who is admitted with COVID 19 and UTI.  Met with pt and pt's daughter in room today. History provided by both patient and pt's daughter. Pt reports good appetite and oral intake at baseline but reports that her oral intake as been poor for the past month. Pt eating sips and bites of meals in hospital. Per chart, pt is down 10lbs(6%) since August; RD unsure how recently weight loss occurred. RD discussed with pt the importance of adequate nutrition needed to preserve lean muscle. Pt drinking Ensure in hospital and reports that she likes the strawberry the most. Recommend continue supplements and MVI, RD will liberalize pt's diet as pt is not eating enough to exceed nutrient limits. Pt is at high refeed risk.   Medications reviewed and include: vitamin C, aspirin, D3, lovenox, MVI, zinc, ceftriaxone   Labs reviewed: Na 132(L)  NUTRITION - FOCUSED PHYSICAL EXAM:  Flowsheet Row Most Recent Value  Orbital Region No depletion  Upper Arm Region No depletion  Thoracic and Lumbar Region No depletion  Buccal Region No depletion  Temple Region Mild depletion  Clavicle Bone Region Mild depletion  Clavicle and Acromion Bone Region Mild depletion  Scapular Bone Region No depletion  Dorsal  Hand Moderate depletion  Patellar Region No depletion  Anterior Thigh Region No depletion  Posterior Calf Region No depletion  Edema (RD Assessment) Mild  Hair Reviewed  Eyes Reviewed  Mouth Reviewed  Skin Reviewed  Nails Reviewed   Diet Order:   Diet Order             Diet regular Room service appropriate? Yes; Fluid consistency: Thin  Diet effective now                  EDUCATION NEEDS:   Education needs have been addressed  Skin:  Skin Assessment: Reviewed RN Assessment  Last BM:  1/11- TYPE 5  Height:   Ht Readings from Last 1 Encounters:  12/08/21 _0  (1.626 m)    Weight:   Wt Readings from Last 1 Encounters:  12/08/21 73.8 kg    Ideal Body Weight:  54.5 kg  BMI:  Body mass index is 27.91 kg/m.  Estimated Nutritional Needs:   Kcal:  1600-1800kcal/day  Protein:  80-90g/day  Fluid:  1.4-1.6L/day  Koleen Distance MS, RD, LDN Please refer to [facility] for RD and/or RD on-call/weekend/after hours pager

## 2021-12-08 NOTE — Assessment & Plan Note (Addendum)
Present on admission.  Chest x-ray negative for pneumonia patient and family declined remdesivir which was ordered on admission.  Per daughter, patient has declined all vaccines and COVID-specific treatment and they want to honor that wish. Patient is having headaches, sore throat and a dry cough, mildly symptomatic.  No hypoxia. -- Supportive care -- Monitor respiratory status and repeat chest x-ray if worsening

## 2021-12-08 NOTE — Assessment & Plan Note (Signed)
Chronic, stable. Continue ASA, statin

## 2021-12-09 ENCOUNTER — Inpatient Hospital Stay: Payer: Medicare Other

## 2021-12-09 DIAGNOSIS — M25511 Pain in right shoulder: Secondary | ICD-10-CM | POA: Diagnosis not present

## 2021-12-09 DIAGNOSIS — N3000 Acute cystitis without hematuria: Secondary | ICD-10-CM | POA: Diagnosis not present

## 2021-12-09 DIAGNOSIS — R531 Weakness: Secondary | ICD-10-CM

## 2021-12-09 LAB — CBC WITH DIFFERENTIAL/PLATELET
Abs Immature Granulocytes: 0.01 10*3/uL (ref 0.00–0.07)
Basophils Absolute: 0 10*3/uL (ref 0.0–0.1)
Basophils Relative: 0 %
Eosinophils Absolute: 0 10*3/uL (ref 0.0–0.5)
Eosinophils Relative: 0 %
HCT: 36.7 % (ref 36.0–46.0)
Hemoglobin: 12.3 g/dL (ref 12.0–15.0)
Immature Granulocytes: 0 %
Lymphocytes Relative: 41 %
Lymphs Abs: 1.9 10*3/uL (ref 0.7–4.0)
MCH: 30.4 pg (ref 26.0–34.0)
MCHC: 33.5 g/dL (ref 30.0–36.0)
MCV: 90.6 fL (ref 80.0–100.0)
Monocytes Absolute: 0.8 10*3/uL (ref 0.1–1.0)
Monocytes Relative: 18 %
Neutro Abs: 1.8 10*3/uL (ref 1.7–7.7)
Neutrophils Relative %: 41 %
Platelets: 165 10*3/uL (ref 150–400)
RBC: 4.05 MIL/uL (ref 3.87–5.11)
RDW: 13.7 % (ref 11.5–15.5)
WBC: 4.6 10*3/uL (ref 4.0–10.5)
nRBC: 0 % (ref 0.0–0.2)

## 2021-12-09 LAB — C-REACTIVE PROTEIN: CRP: 1.1 mg/dL — ABNORMAL HIGH (ref <1.0)

## 2021-12-09 LAB — URINE CULTURE: Culture: >=100000 — AB

## 2021-12-09 LAB — COMPREHENSIVE METABOLIC PANEL
ALT: 12 U/L (ref 0–44)
AST: 45 U/L — ABNORMAL HIGH (ref 15–41)
Albumin: 3.1 g/dL — ABNORMAL LOW (ref 3.5–5.0)
Alkaline Phosphatase: 61 U/L (ref 38–126)
Anion gap: 7 (ref 5–15)
BUN: 18 mg/dL (ref 8–23)
CO2: 24 mmol/L (ref 22–32)
Calcium: 8.1 mg/dL — ABNORMAL LOW (ref 8.9–10.3)
Chloride: 103 mmol/L (ref 98–111)
Creatinine, Ser: 0.99 mg/dL (ref 0.44–1.00)
GFR, Estimated: 54 mL/min — ABNORMAL LOW (ref >60)
Glucose, Bld: 106 mg/dL — ABNORMAL HIGH (ref 70–99)
Potassium: 4.3 mmol/L (ref 3.5–5.1)
Sodium: 134 mmol/L — ABNORMAL LOW (ref 135–145)
Total Bilirubin: 0.3 mg/dL (ref 0.3–1.2)
Total Protein: 6.3 g/dL — ABNORMAL LOW (ref 6.5–8.1)

## 2021-12-09 LAB — D-DIMER, QUANTITATIVE: D-Dimer, Quant: 0.75 ug/mL-FEU — ABNORMAL HIGH (ref 0.00–0.50)

## 2021-12-09 LAB — MAGNESIUM: Magnesium: 1.8 mg/dL (ref 1.7–2.4)

## 2021-12-09 LAB — PROCALCITONIN: Procalcitonin: <0.1 ng/mL

## 2021-12-09 LAB — FERRITIN: Ferritin: 344 ng/mL — ABNORMAL HIGH (ref 11–307)

## 2021-12-09 MED ORDER — CEPHALEXIN 500 MG PO CAPS
500.0000 mg | ORAL_CAPSULE | Freq: Two times a day (BID) | ORAL | Status: DC
Start: 2021-12-10 — End: 2021-12-11
  Administered 2021-12-10 – 2021-12-11 (×3): 500 mg via ORAL
  Filled 2021-12-09 (×3): qty 1

## 2021-12-09 MED ORDER — DICLOFENAC SODIUM 1 % EX GEL
2.0000 g | Freq: Four times a day (QID) | CUTANEOUS | Status: DC
  Administered 2021-12-09 – 2021-12-11 (×10): 2 g via TOPICAL
  Filled 2021-12-09: qty 100

## 2021-12-09 NOTE — Evaluation (Signed)
Occupational Therapy Evaluation Patient Details Name: Kimberly Scott MRN: [id number] DOB: [birth year] Today's Date: 12/09/2021   History of Present Illness Kimberly Scott is a 88 y.o. female with a history of Parkinson's disease, CAD status post CABG, hypertension who presents for generalized weakness.   Clinical Impression   Patient presenting with decreased Ind in self care, balance, functional mobility/transfers, endurance, and safety awareness. Patient reports being mod I with use of cane at baseline. She lives alone but goes to daughters home for walk in shower and daughter also assists with meal prep. Patient currently functioning at min A for functional transfers and mobility with use of RW and needing min cuing for hand placement and technique. Pt with urgent need for toileting and having to transfer to [facility] for time management. Pt needing assistance with balance in standing and also hygiene for thoroughness. Patient will benefit from acute OT to increase overall independence in the areas of ADLs, functional mobility, and safety awareness in order to safely discharge to next venue of care.      Recommendations for follow up therapy are one component of a multi-disciplinary discharge planning process, led by the attending physician.  Recommendations may be updated based on patient status, additional functional criteria and insurance authorization.   Follow Up Recommendations  Skilled nursing-short term rehab (<3 hours/day)    Assistance Recommended at Discharge Frequent or constant Supervision/Assistance  Patient can return home with the following A little help with walking and/or transfers;A little help with bathing/dressing/bathroom;Assistance with cooking/housework;Direct supervision/assist for medications management;Direct supervision/assist for financial management;Help with stairs or ramp for entrance    Functional Status Assessment  Patient has had a recent decline in their  functional status and demonstrates the ability to make significant improvements in function in a reasonable and predictable amount of time.  Equipment Recommendations  Other (comment) (defer to next venue of care)       Precautions / Restrictions Precautions Precautions: Fall Restrictions Weight Bearing Restrictions: No      Mobility Bed Mobility Overal bed mobility: Needs Assistance Bed Mobility: Supine to Sit     Supine to sit: Min assist;HOB elevated          Transfers Overall transfer level: Needs assistance Equipment used: Rolling walker (2 wheels) Transfers: Sit to/from Stand Sit to Stand: Min assist           General transfer comment: initial posterior lean with standing requiring assist to prevent post LOB.      Balance Overall balance assessment: Needs assistance Sitting-balance support: Feet supported Sitting balance-Leahy Scale: Fair     Standing balance support: Reliant on assistive device for balance;Bilateral upper extremity supported Standing balance-Leahy Scale: Fair Standing balance comment: use of RW                           ADL either performed or assessed with clinical judgement   ADL Overall ADL's : Needs assistance/impaired                         Toilet Transfer: Minimal assistance;BSC/3in1;Rolling walker (2 wheels)   Toileting- Clothing Manipulation and Hygiene: Moderate assistance;Sit to/from stand       Functional mobility during ADLs: Minimal assistance;Rolling walker (2 wheels)       Vision Patient Visual Report: No change from baseline              Pertinent Vitals/Pain Pain Assessment: No/denies pain  Hand Dominance Right   Extremity/Trunk Assessment Upper Extremity Assessment Upper Extremity Assessment: Generalized weakness   Lower Extremity Assessment Lower Extremity Assessment: Generalized weakness   Cervical / Trunk Assessment Cervical / Trunk Assessment: Kyphotic    Communication Communication Communication: HOH   Cognition Arousal/Alertness: Awake/alert Behavior During Therapy: WFL for tasks assessed/performed;Flat affect Overall Cognitive Status: Within Functional Limits for tasks assessed                                 General Comments: increased time to process        Exercises General Exercises - Lower Extremity Long Arc Quad: AROM;Seated;Strengthening;Both;10 reps Hip Flexion/Marching: AROM;Seated;Strengthening;Both;10 reps Toe Raises: AROM;Seated;Strengthening;Both;10 reps Heel Raises: AROM;Seated;Strengthening;Both;10 reps Other Exercises Other Exercises: Role of PT in acute setting, safe hand placement/sequencing with transfers, motor planning for STS transfers to prvent posterior LOB, therex for LE's, D/c recs.   Shoulder Instructions      Home Living Family/patient expects to be discharged to:: Private residence Living Arrangements: Alone Available Help at Discharge: Family;Available PRN/intermittently Type of Home: House Home Access: Stairs to enter [employer] of Steps: 4 Entrance Stairs-Rails: Right Home Layout: One level     Bathroom Shower/Tub: Teacher, early years/pre: Standard Bathroom Accessibility: Yes   Home Equipment: Conservation officer, nature (2 wheels);Cane - single point;Shower seat;Grab bars - tub/shower;Rollator (4 wheels)          Prior Functioning/Environment Prior Level of Function : Needs assist       Physical Assist : ADLs (physical);Mobility (physical) Mobility (physical): Gait ADLs (physical): Bathing;IADLs Mobility Comments: Use of SPC for ambulation at baseline ADLs Comments: Daughter assists in bathing and meal prep        OT Problem List: Decreased strength;Decreased activity tolerance;Impaired balance (sitting and/or standing);Decreased knowledge of use of DME or AE;Decreased safety awareness;Decreased cognition      OT Treatment/Interventions:  Self-care/ADL training;Therapeutic exercise;Therapeutic activities;Energy conservation;Cognitive remediation/compensation;DME and/or AE instruction;Patient/family education;Balance training;Manual therapy    OT Goals(Current goals can be found in the care plan section) Acute Rehab OT Goals Patient Stated Goal: to go home OT Goal Formulation: With patient Time For Goal Achievement: 12/23/21 Potential to Achieve Goals: Good ADL Goals Pt Will Perform Grooming: with modified independence;standing Pt Will Perform Lower Body Dressing: with supervision;sit to/from stand Pt Will Transfer to Toilet: with supervision;ambulating Pt Will Perform Toileting - Clothing Manipulation and hygiene: with supervision;sit to/from stand  OT Frequency: Min 2X/week       AM-PAC OT "6 Clicks" Daily Activity     Outcome Measure Help from another person eating meals?: None Help from another person taking care of personal grooming?: A Little Help from another person toileting, which includes using toliet, bedpan, or urinal?: A Little Help from another person bathing (including washing, rinsing, drying)?: A Little Help from another person to put on and taking off regular upper body clothing?: None Help from another person to put on and taking off regular lower body clothing?: A Little 6 Click Score: 20   End of Session Equipment Utilized During Treatment: Rolling walker (2 wheels) Nurse Communication: Mobility status  Activity Tolerance: Patient tolerated treatment well Patient left: in chair;with call bell/phone within reach;with chair alarm set;with family/visitor present  OT Visit Diagnosis: Unsteadiness on feet (R26.81);Repeated falls (R29.6);Muscle weakness (generalized) (M62.81)                Time: 8338-2505 OT Time Calculation (min): 29 min Charges:  OT General Charges $OT Visit: 1 Visit OT Evaluation $OT Eval Moderate Complexity: 1 Mod OT Treatments $Self Care/Home Management : 23-37 mins  Darleen Crocker, MS, OTR/L , CBIS ascom [phone]  12/09/21, 3:05 PM

## 2021-12-09 NOTE — Assessment & Plan Note (Addendum)
Patient lives alone and currently requiring significant assistance for any mobility. PT recommending SNF for rehab. TOC following. Patient and daughter hopeful her strength will improve so she is able to return home. Patient will require 24/7 assistance after discharge. --Fall precautions --Continue PT and OT --Out of bed, up in chair as much as possible

## 2021-12-09 NOTE — Progress Notes (Addendum)
Mobility Specialist - Progress Note   12/09/21 1700  Mobility  Activity Transferred:  Chair to bed  Range of Motion/Exercises Active  Level of Assistance Standby assist, set-up cues, supervision of patient - no hands on  Assistive Device Front wheel walker  Distance Ambulated (ft) 2 ft  Mobility Sit up in bed/chair position for meals  Mobility Response Tolerated well  Mobility performed by Mobility specialist  $Mobility charge 1 Mobility     Pt sitting in recliner upon arrival, utilizing RA. Pt participated in seated bathing ADLs. Does voice skin feeling tender, grimaces at gentle touch on LE. Pt stood to RW with minA and SPT to bed. Assist for LE support to return supine. Pt left in bed with alarm set, needs in reach. Daughter at bedside.    Kathee Delton Mobility Specialist 12/09/21, 5:24 PM

## 2021-12-09 NOTE — Progress Notes (Signed)
Progress Note   Patient: Kimberly Scott HYQ:[id number] DOB: [birth year] DOA: 12/07/2021     2 DOS: the patient was seen and examined on 12/09/2021   Brief hospital course: 88 y.o. female with medical history significant for osteoarthritis, hypertension and angina, who presented to the ER with acute onset of cough productive of whitish sputum for the last couple of days with associated mild dyspnea and wheezing as well as sore throat with postnasal drip, fever and chills.  She denies any loss of taste.  She has chronic loss of smell.  She admits to mild lower abdominal pain with cough.   Workup in the ED revealed UA consistent with UTI, sent for culture.  Covid-19 PCR was positive.  Remdesivir was ordered, but declined by patient and family (patient also unvaccinated, family honoring her wishes to avoid vaccines or covid treatments).  Chest xray without PNA.  Started on empiric Rocephin and admitted to [facility] service.  Assessment and Plan * UTI (urinary tract infection)- (present on admission) Present on admission, UA was consistent with infection and patient had suprapubic abdominal pain that has improved with antibiotics. Urine culture grew pansensitive E. coli. --Continue IV Rocephin. --Transition to Keflex tomorrow to complete 5-day course   Acute metabolic encephalopathy- (present on admission) Present on admission, likely due to COVID infection and UTI.  Mental status has improved.   -- Delirium precautions -- Treat UTI  COVID-19 virus infection- (present on admission) Present on admission.  Chest x-ray negative for pneumonia patient and family declined remdesivir which was ordered on admission.  Per daughter, patient has declined all vaccines and COVID-specific treatment and they want to honor that wish. Patient is having headaches, sore throat and a dry cough, mildly symptomatic.  No hypoxia. -- Supportive care -- Monitor respiratory status and repeat chest x-ray if  worsening   Hypertension- (present on admission) BP uncontrolled on admission. Continue antihypertensives As needed hydralazine. 1/12: BP fairly stable with soft diastolics in the 95M, systolic in the 841L.  Peripheral neuropathy Continue gabapentin  Hyperlipidemia- (present on admission) Continue statin  GERD (gastroesophageal reflux disease)- (present on admission) Appears not on a PPI.  Monitor  TIA (transient ischemic attack)- (present on admission) On aspirin and statin.  No acute neurologic symptoms.  Right shoulder pain Patient has new onset pain and weakness in her right shoulder since admission.  Suspect this is from requiring assistance with mobility in the right arm being pulled on.  Suspect she has a rotator cuff strain versus tear. -- Voltaren gel  --Acetaminophen as needed -- Right shoulder x-ray to further evaluate -- Caution with mobility assistance  Generalized weakness Patient lives alone and currently requiring significant assistance for any mobility. PT recommending SNF for rehab. TOC following. Patient and daughter hopeful her strength will improve so she is able to return home. --Fall precautions --Continue PT and OT --Out of bed, up in chair as much as possible   Parkinson's disease ([place])- (present on admission) Continue Sinemet  Coronary arteriosclerosis- (present on admission) Chronic, stable. Continue ASA, statin     Subjective: Patient up in recliner chair with daughter at bedside when seen today.  Physical therapy had just worked with patient and she is requiring much more assistance than is typical for her.  She required 2+ assistance to use the bedside commode overnight and was reportedly too weak to stand on her own.  Patient is reporting right shoulder pain and weakness that is new since admission.  Daughter thinks this may be  from requiring assistance for mobility.  Patient has to use her left arm to elevate her right arm.  She  continues to have dry scratchy throat, occasional dry cough and headaches.  Objective Vitals reviewed and notable for BP 141/55, 155/60, no hypoxia with O2 sat 93% on room air, no fevers.  General exam: awake, alert, no acute distress HEENT: atraumatic, clear conjunctiva, anicteric sclera, moist mucus membranes, hearing grossly normal  Respiratory system: CTAB, no wheezes, rales or rhonchi, normal respiratory effort at rest, on room air. Cardiovascular system: normal S1/S2, RRR, no pedal edema.   Central nervous system: A&O x3. no gross focal neurologic deficits, normal speech Extremities: Right shoulder flexion requires assistance with active range of motion, has pain with holding shoulder flexion against gravity, flexion is limited due to pain, right shoulder passive abduction less painful but has pain when returning to resting position, right upper extremity pill-rolling tremor at rest Skin: dry, intact, normal temperature Psychiatry: normal mood, flat affect   Data Reviewed:  Labs reviewed and notable for sodium 134, glucose 106, calcium 8.1, albumin 3.1, AST 45, ferritin 344, procalcitonin negative, CRP 1.1, CBC normal, D-dimer 0.75 trending down.  Right shoulder x-ray - No fracture or dislocation is seen in the right shoulder. There are no abnormal soft tissue calcifications.  Family Communication: Daughter at bedside on rounds today  Disposition: Status is: Inpatient  Remains inpatient appropriate because: Severity of illness as outlined above, ongoing evaluation of new onset right shoulder pain, may require SNF placement due to profound generalized weakness.         Time spent: 35 minutes  Author: Ezekiel Slocumb, DO 12/09/2021 2:34 PM  For on call review [email or web address].

## 2021-12-09 NOTE — Evaluation (Signed)
Physical Therapy Evaluation Patient Details Name: Kimberly Scott MRN: [id number] DOB: 10-22-1928 Today's Date: 12/09/2021  History of Present Illness  Kimberly Scott is a 88 y.o. female with a history of Parkinson's disease, CAD status post CABG, hypertension who presents for generalized weakness.   Clinical Impression  Pt admitted with above diagnosis. Pt received in bed with daughter present at bedside. Agreeable to PT. Obtaining home lay out, PLOF, DME, from pt and daughter. At baseline pt receives assist with bathing (performed at daughter's house), and receives meals from daughter. Otherwise pt is independent and lives at home alone ambulating with [facility]. Pt currently displaying deficits in bed mobility, transfers and safe utilization of RW and poor safety awareness. Pt relying on extra time and HOB elevated to achieve EOB with minA at chuck pad to reach sitting EoB. Initial STS to RW required minA and max VC'ing for hand placement and motor planning. Posterior lean noted despite cuing requiring minA to correct. Pt stands at [facility] with kyphotic posture ambulating within room with RW far outside BOS anteriorly requiring minA on RW for proximity and education on importance for reducing falls. Throughout ambulation pt moving hands off of handles of RW reaching for counter tops, recliner hand rail, etc requiring max cuing for maintaining hands on RW throughout ambulation. Pt able to return to recliner with poor sequencing requiring max cues for safe RW sequencing, hand placement and safe descent to recliner. With PT demo and education on STS technique, pt able to perform x3 STS from recliner to RW with minguard and improved anterior weight shift to attain standing without posterior LOB or lean. Pt left in recliner with all needs in reach. Pt unable to have 24/7 supervision at home. Due to limited supervision and poor safety awareness/use of AD, recommend STR at discharge to improve safety and  independence with all mobility prior to transitioning back to home environment. Pt currently with functional limitations due to the deficits listed below (see PT Problem List). Pt will benefit from skilled PT to increase their independence and safety with mobility to allow discharge to the venue listed below.      Recommendations for follow up therapy are one component of a multi-disciplinary discharge planning process, led by the attending physician.  Recommendations may be updated based on patient status, additional functional criteria and insurance authorization.  Follow Up Recommendations Skilled nursing-short term rehab (<3 hours/day)    Assistance Recommended at Discharge Frequent or constant Supervision/Assistance  Patient can return home with the following  A lot of help with walking and/or transfers;Assistance with cooking/housework;Assist for transportation;A little help with bathing/dressing/bathroom    Equipment Recommendations None recommended by PT  Recommendations for Other Services       Functional Status Assessment Patient has had a recent decline in their functional status and demonstrates the ability to make significant improvements in function in a reasonable and predictable amount of time.     Precautions / Restrictions Precautions Precautions: Fall Restrictions Weight Bearing Restrictions: No      Mobility  Bed Mobility Overal bed mobility: Needs Assistance Bed Mobility: Supine to Sit     Supine to sit: Min assist;HOB elevated       Patient Response: Cooperative;Flat affect  Transfers Overall transfer level: Needs assistance Equipment used: Rolling walker (2 wheels) Transfers: Sit to/from Stand Sit to Stand: Min assist           General transfer comment: initial posterior lean with standing requiring assist to prevent post LOB.  Ambulation/Gait Ambulation/Gait assistance: Min assist Gait Distance (Feet): 20 Feet Assistive device: Rolling  walker (2 wheels) Gait Pattern/deviations: Step-through pattern;Trunk flexed;Decreased step length - right;Decreased step length - left       General Gait Details: Maintains RW far outside BOS despite max multimodal cues  Stairs            Wheelchair Mobility    Modified Rankin (Stroke Patients Only)       Balance Overall balance assessment: Needs assistance Sitting-balance support: Feet supported Sitting balance-Leahy Scale: Fair     Standing balance support: Reliant on assistive device for balance;Bilateral upper extremity supported Standing balance-Leahy Scale: Fair Standing balance comment: use of RW                             Pertinent Vitals/Pain Pain Assessment: No/denies pain    Home Living Family/patient expects to be discharged to:: Private residence Living Arrangements: Alone Available Help at Discharge: Family;Available PRN/intermittently Type of Home: House Home Access: Stairs to enter Entrance Stairs-Rails: Right Entrance Stairs-Number of Steps: 4   Home Layout: One level Home Equipment: Conservation officer, nature (2 wheels);Cane - single point;Shower seat;Grab bars - tub/shower;Rollator (4 wheels)      Prior Function Prior Level of Function : Needs assist       Physical Assist : ADLs (physical);Mobility (physical) Mobility (physical): Gait ADLs (physical): Bathing;IADLs Mobility Comments: Use of SPC for ambulation at baseline ADLs Comments: Daughter assists in bathing and meal prep     Hand Dominance   Dominant Hand: Right    Extremity/Trunk Assessment   Upper Extremity Assessment Upper Extremity Assessment: Generalized weakness    Lower Extremity Assessment Lower Extremity Assessment: Generalized weakness    Cervical / Trunk Assessment Cervical / Trunk Assessment: Kyphotic  Communication   Communication: HOH  Cognition Arousal/Alertness: Awake/alert Behavior During Therapy: WFL for tasks assessed/performed;Flat  affect Overall Cognitive Status: Within Functional Limits for tasks assessed                                          General Comments      Exercises General Exercises - Lower Extremity Long Arc Quad: AROM;Seated;Strengthening;Both;10 reps Hip Flexion/Marching: AROM;Seated;Strengthening;Both;10 reps Toe Raises: AROM;Seated;Strengthening;Both;10 reps Heel Raises: AROM;Seated;Strengthening;Both;10 reps Other Exercises Other Exercises: Role of PT in acute setting, safe hand placement/sequencing with transfers, motor planning for STS transfers to prvent posterior LOB, therex for LE's, D/c recs.   Assessment/Plan    PT Assessment Patient needs continued PT services  PT Problem List Decreased strength;Decreased range of motion;Decreased knowledge of use of DME;Decreased activity tolerance;Decreased safety awareness;Decreased balance;Decreased mobility       PT Treatment Interventions DME instruction;Balance training;Gait training;Neuromuscular re-education;Stair training;Functional mobility training;Patient/family education;Therapeutic activities;Therapeutic exercise    PT Goals (Current goals can be found in the Care Plan section)  Acute Rehab PT Goals Patient Stated Goal: to go home PT Goal Formulation: With patient Time For Goal Achievement: 12/23/21 Potential to Achieve Goals: Fair    Frequency Min 2X/week     Co-evaluation               AM-PAC PT "6 Clicks" Mobility  Outcome Measure Help needed turning from your back to your side while in a flat bed without using bedrails?: A Lot Help needed moving from lying on your back to sitting on the side of a flat  bed without using bedrails?: A Lot Help needed moving to and from a bed to a chair (including a wheelchair)?: A Little Help needed standing up from a chair using your arms (e.g., wheelchair or bedside chair)?: A Little Help needed to walk in hospital room?: A Lot Help needed climbing 3-5 steps with  a railing? : A Lot 6 Click Score: 14    End of Session Equipment Utilized During Treatment: Gait belt Activity Tolerance: Patient tolerated treatment well Patient left: in chair;with chair alarm set;with family/visitor present Nurse Communication: Mobility status PT Visit Diagnosis: Unsteadiness on feet (R26.81);Other abnormalities of gait and mobility (R26.89);Muscle weakness (generalized) (M62.81);Difficulty in walking, not elsewhere classified (R26.2)    Time: 1856-3149 PT Time Calculation (min) (ACUTE ONLY): 54 min   Charges:   PT Evaluation $PT Eval Moderate Complexity: 1 Mod PT Treatments $Gait Training: 23-37 mins $Therapeutic Exercise: 8-22 mins       Cayman Brogden M. Fairly IV, PT, DPT Physical Therapist- [facility]  12/09/2021, 1:16 PM

## 2021-12-09 NOTE — Assessment & Plan Note (Addendum)
Patient has new onset pain and weakness in her right shoulder since admission.  Suspect this is from requiring assistance with mobility in the right arm being pulled on.  Suspect she has a rotator cuff strain versus tear. Right shoulder x-ray negative for fracture or dislocation. MRI of the shoulder confirms rotator cuff tears. Discussed with orthopedic surgeon on call, they reviewed the MRI and feel this is likely a chronic issue, and patient is unlikely a candidate for surgical repair. -- Ortho recommends -  Sling, tylenol and/or NSAIDs if tolerated as needed.   Outpatient [facility] or [facility] if pain persists.  -- Voltaren gel, Tylenol  -- Caution with mobility assistance -- Home health PT/OT arranged

## 2021-12-10 ENCOUNTER — Inpatient Hospital Stay: Payer: Medicare Other

## 2021-12-10 DIAGNOSIS — N3000 Acute cystitis without hematuria: Secondary | ICD-10-CM | POA: Diagnosis not present

## 2021-12-10 DIAGNOSIS — R531 Weakness: Secondary | ICD-10-CM | POA: Diagnosis not present

## 2021-12-10 DIAGNOSIS — U071 COVID-19: Secondary | ICD-10-CM | POA: Diagnosis not present

## 2021-12-10 LAB — CBC WITH DIFFERENTIAL/PLATELET
Abs Immature Granulocytes: 0.01 10*3/uL (ref 0.00–0.07)
Basophils Absolute: 0 10*3/uL (ref 0.0–0.1)
Basophils Relative: 0 %
Eosinophils Absolute: 0 10*3/uL (ref 0.0–0.5)
Eosinophils Relative: 0 %
HCT: 35.1 % — ABNORMAL LOW (ref 36.0–46.0)
Hemoglobin: 11.9 g/dL — ABNORMAL LOW (ref 12.0–15.0)
Immature Granulocytes: 0 %
Lymphocytes Relative: 41 %
Lymphs Abs: 1.8 10*3/uL (ref 0.7–4.0)
MCH: 30.8 pg (ref 26.0–34.0)
MCHC: 33.9 g/dL (ref 30.0–36.0)
MCV: 90.9 fL (ref 80.0–100.0)
Monocytes Absolute: 0.7 10*3/uL (ref 0.1–1.0)
Monocytes Relative: 16 %
Neutro Abs: 1.8 10*3/uL (ref 1.7–7.7)
Neutrophils Relative %: 43 %
Platelets: 151 10*3/uL (ref 150–400)
RBC: 3.86 MIL/uL — ABNORMAL LOW (ref 3.87–5.11)
RDW: 13.7 % (ref 11.5–15.5)
WBC: 4.3 10*3/uL (ref 4.0–10.5)
nRBC: 0 % (ref 0.0–0.2)

## 2021-12-10 LAB — COMPREHENSIVE METABOLIC PANEL
ALT: 13 U/L (ref 0–44)
AST: 42 U/L — ABNORMAL HIGH (ref 15–41)
Albumin: 2.9 g/dL — ABNORMAL LOW (ref 3.5–5.0)
Alkaline Phosphatase: 56 U/L (ref 38–126)
Anion gap: 8 (ref 5–15)
BUN: 16 mg/dL (ref 8–23)
CO2: 24 mmol/L (ref 22–32)
Calcium: 8.5 mg/dL — ABNORMAL LOW (ref 8.9–10.3)
Chloride: 104 mmol/L (ref 98–111)
Creatinine, Ser: 0.88 mg/dL (ref 0.44–1.00)
GFR, Estimated: >60 mL/min (ref >60)
Glucose, Bld: 112 mg/dL — ABNORMAL HIGH (ref 70–99)
Potassium: 4.2 mmol/L (ref 3.5–5.1)
Sodium: 136 mmol/L (ref 135–145)
Total Bilirubin: 0.3 mg/dL (ref 0.3–1.2)
Total Protein: 6.1 g/dL — ABNORMAL LOW (ref 6.5–8.1)

## 2021-12-10 LAB — C-REACTIVE PROTEIN: CRP: 0.8 mg/dL (ref <1.0)

## 2021-12-10 LAB — MAGNESIUM: Magnesium: 1.8 mg/dL (ref 1.7–2.4)

## 2021-12-10 LAB — D-DIMER, QUANTITATIVE: D-Dimer, Quant: 0.5 ug/mL-FEU (ref 0.00–0.50)

## 2021-12-10 LAB — FERRITIN: Ferritin: 330 ng/mL — ABNORMAL HIGH (ref 11–307)

## 2021-12-10 MED ORDER — LORAZEPAM 2 MG/ML IJ SOLN
0.5000 mg | Freq: Once | INTRAMUSCULAR | Status: AC
  Administered 2021-12-10: 0.5 mg via INTRAVENOUS
  Filled 2021-12-10: qty 1

## 2021-12-10 MED ORDER — BOOST / RESOURCE BREEZE PO LIQD CUSTOM
1.0000 | Freq: Three times a day (TID) | ORAL | Status: DC
  Administered 2021-12-10 – 2021-12-11 (×4): 1 via ORAL

## 2021-12-10 NOTE — Progress Notes (Addendum)
Physical Therapy Treatment Patient Details Name: Kimberly Scott MRN: [id number] DOB: 11-Dec-1927 Today's Date: 12/10/2021   History of Present Illness Kimberly Scott is a 88 y.o. female with a history of Parkinson's disease, CAD status post CABG, hypertension who presents for generalized weakness.    PT Comments    Pt was sitting in recliner with supportive daughter at bedside. She agrees to session and is cooperative throughout. Does have some cognition deficits come to light throughout session however it did not impact session progression. Pt is on COVID isolation. Author followed COVID protocols for leaving the room and pt was transported via [facility] to rehab gym to perform stair training. Pt demonstrated safe ability to ascend/descend stairs with and without rail. Daughter was present throughout stair training. Lengthy discussion about DC disposition and that author feels Home with HH is the best option for pt. DC recs were updated an MD/CM made aware. Acute PT will continue to follow and progress as able per current POC.    Recommendations for follow up therapy are one component of a multi-disciplinary discharge planning process, led by the attending physician.  Recommendations may be updated based on patient status, additional functional criteria and insurance authorization.  Follow Up Recommendations  Home health PT     Assistance Recommended at Discharge Frequent or constant Supervision/Assistance  Patient can return home with the following A little help with walking and/or transfers;Direct supervision/assist for medications management;Assistance with cooking/housework;Assist for transportation;A little help with bathing/dressing/bathroom;Help with stairs or ramp for entrance   Equipment Recommendations  Rolling walker (2 wheels) (pt's daughter has rollator however no RW)       Precautions / Restrictions Precautions Precautions: Fall Restrictions Weight Bearing Restrictions: No      Mobility  Bed Mobility  General bed mobility comments: pt was in recliner pre/post session Patient Response: Cooperative;Flat affect  Transfers Overall transfer level: Needs assistance Equipment used: Rolling walker (2 wheels) Transfers: Sit to/from Stand Sit to Stand: Min guard    General transfer comment: CGA for safety    Ambulation/Gait Ambulation/Gait assistance: Min guard;Supervision Gait Distance (Feet): 25 Feet Assistive device: Rolling walker (2 wheels) Gait Pattern/deviations: Step-through pattern;Trunk flexed;Decreased step length - right;Decreased step length - left Gait velocity: decreased     General Gait Details: Improved ability to keep RW clsoer to BOS. Does require intermittent, min VC's with good carryover after cuing. Remains kyphotic   Stairs Stairs: Yes Stairs assistance: Min guard Stair Management: No rails;One rail Right;Backwards;Step to pattern;Sideways Number of Stairs: 4 General stair comments: Pt performed ascending/descending 4 stair 2 xs. She performed first trial with BUE support on +1 R rail. 2nd trial, pt was able to ascend going backwards and descend fwds with use of RW   Balance Overall balance assessment: Needs assistance Sitting-balance support: Feet supported Sitting balance-Leahy Scale: Fair     Standing balance support: Reliant on assistive device for balance;Bilateral upper extremity supported Standing balance-Leahy Scale: Fair Standing balance comment: use of RW       Cognition Arousal/Alertness: Awake/alert Behavior During Therapy: WFL for tasks assessed/performed;Flat affect Overall Cognitive Status: Within Functional Limits for tasks assessed      General Comments: increased time to process        Exercises General Exercises - Lower Extremity Ankle Circles/Pumps: AROM;Strengthening;Both;10 reps;Seated Long Arc Quad: AROM;Seated;Strengthening;Both;10 reps Hip Flexion/Marching: AROM;Seated;Strengthening;Both;10  reps Toe Raises: AROM;Seated;Strengthening;Both;10 reps Heel Raises: AROM;Seated;Strengthening;Both;10 reps Other Exercises Other Exercises: Education to daughter on physical assist/need/supervision needed if pt to d/c to daughter's  house. Use of gait belt, installing rail for entering/exiting daughter's home.    General Comments General comments (skin integrity, edema, etc.): pt requested to use BR. successfully ambulated into BR and urinated prior to returning to sitting in recliner      Pertinent Vitals/Pain Pain Assessment: No/denies pain     PT Goals (current goals can now be found in the care plan section) Acute Rehab PT Goals Patient Stated Goal: to go home PT Goal Formulation: With patient Time For Goal Achievement: 12/23/21 Potential to Achieve Goals: Fair Progress towards PT goals: Progressing toward goals    Frequency    Min 2X/week      PT Plan Discharge plan needs to be updated       AM-PAC PT "6 Clicks" Mobility   Outcome Measure  Help needed turning from your back to your side while in a flat bed without using bedrails?: A Lot Help needed moving from lying on your back to sitting on the side of a flat bed without using bedrails?: A Lot Help needed moving to and from a bed to a chair (including a wheelchair)?: A Little Help needed standing up from a chair using your arms (e.g., wheelchair or bedside chair)?: A Little Help needed to walk in hospital room?: A Little Help needed climbing 3-5 steps with a railing? : A Little 6 Click Score: 16    End of Session Equipment Utilized During Treatment: Gait belt Activity Tolerance: Patient tolerated treatment well Patient left: in chair;with chair alarm set;with family/visitor present Nurse Communication: Mobility status PT Visit Diagnosis: Unsteadiness on feet (R26.81);Other abnormalities of gait and mobility (R26.89);Muscle weakness (generalized) (M62.81);Difficulty in walking, not elsewhere classified  (R26.2)     Time: 1610-9604 PT Time Calculation (min) (ACUTE ONLY): 27 min  Charges:  $Gait Training: 8-22 mins $Therapeutic Exercise: 8-22 mins $Therapeutic Activity: 8-22 mins                     Julaine Fusi PTA 12/10/21, 2:48 PM

## 2021-12-10 NOTE — NC FL2 (Signed)
[place] LEVEL OF CARE SCREENING TOOL     IDENTIFICATION  Patient Name: Kimberly Scott Birthdate: 12-24-1927 Sex: female Admission Date (Current Location): 12/07/2021  [facility] and [place] Number:  Engineering geologist and Address:  [facility], [address], [place], [place] [address]      Provider Number: [id number]  Attending Physician Name and Address:  Ezekiel Slocumb, DO  Relative Name and Phone Number:       Current Level of Care: Hospital Recommended Level of Care: [place] Prior Approval Number:    Date Approved/Denied:   PASRR Number:    Discharge Plan: SNF    Current Diagnoses: Patient Active Problem List   Diagnosis Date Noted   Generalized weakness 12/09/2021   Right shoulder pain 12/09/2021   COVID-19 virus infection 12/08/2021   Peripheral neuropathy 12/08/2021   Parkinson's disease ([place]) 12/08/2021   UTI (urinary tract infection) 07/31/2019   Hyponatremia 05/21/2018   TIA (transient ischemic attack)    Acute metabolic encephalopathy 09/21/8526   Coronary arteriosclerosis 09/28/2012   Hypertension 09/28/2012   Hyperlipidemia 09/28/2012   GERD (gastroesophageal reflux disease) 09/28/2012   Breast cyst 09/28/2012    Orientation RESPIRATION BLADDER Height & Weight     Self, Time, Situation, Place  Normal Continent Weight: 162 lb 9.6 oz (73.8 kg) Height:  5\' 4"  (162.6 cm)  BEHAVIORAL SYMPTOMS/MOOD NEUROLOGICAL BOWEL NUTRITION STATUS      Continent Diet (regular diet, thin liquids)  AMBULATORY STATUS COMMUNICATION OF NEEDS Skin   Limited Assist Verbally Normal                       Personal Care Assistance Level of Assistance  Bathing, Feeding, Dressing Bathing Assistance: Limited assistance Feeding assistance: Independent Dressing Assistance: Limited assistance     Functional Limitations Info  Sight, Hearing, Speech Sight Info: Adequate Hearing Info: Adequate Speech  Info: Adequate    SPECIAL CARE FACTORS FREQUENCY  PT (By licensed PT), OT (By licensed OT)     PT Frequency: 5x OT Frequency: 5x            Contractures Contractures Info: Not present    Additional Factors Info  Code Status, Allergies, Isolation Precautions Code Status Info: full code Allergies Info: Bacitracin-neomycin-polymyxin, Ace Inhibitors, Omega 3  (Fish Oil), Neosporin (Neomycin-bacitracin Zn-polymyx), Tape     Isolation Precautions Info: Air/Con COVID     Current Medications (12/10/2021):  This is the current hospital active medication list Current Facility-Administered Medications  Medication Dose Route Frequency Provider Last Rate Last Admin   acetaminophen (TYLENOL) tablet 650 mg  650 mg Oral Q6H PRN Mansy, Jan A, MD       ascorbic acid (VITAMIN C) tablet 1,000 mg  1,000 mg Oral Daily Mansy, Jan A, MD   1,000 mg at 12/09/21 7824   aspirin EC tablet 81 mg  81 mg Oral Daily Mansy, Jan A, MD   81 mg at 12/09/21 0918   atorvastatin (LIPITOR) tablet 40 mg  40 mg Oral q1800 Mansy, Jan A, MD   40 mg at 12/09/21 1739   carbidopa-levodopa (SINEMET IR) 25-100 MG per tablet immediate release 1 tablet  1 tablet Oral BID AC Mansy, Jan A, MD   1 tablet at 12/09/21 1739   carbidopa-levodopa (SINEMET IR) 25-100 MG per tablet immediate release 2 tablet  2 tablet Oral q AM Mansy, Arvella Merles, MD   2 tablet at 12/10/21 0617   cephALEXin (KEFLEX)  capsule 500 mg  500 mg Oral Q12H Nicole Kindred A, DO       cholecalciferol (VITAMIN D3) tablet 1,000 Units  1,000 Units Oral Daily Mansy, Jan A, MD   1,000 Units at 12/09/21 0076   diclofenac Sodium (VOLTAREN) 1 % topical gel 2 g  2 g Topical QID Nicole Kindred A, DO   2 g at 12/09/21 2159   enoxaparin (LOVENOX) injection 40 mg  40 mg Subcutaneous Q24H Mansy, Jan A, MD   40 mg at 12/09/21 0918   feeding supplement (ENSURE ENLIVE / ENSURE PLUS) liquid 237 mL  237 mL Oral TID BM Nicole Kindred A, DO   237 mL at 12/09/21 2158   gabapentin  (NEURONTIN) capsule 100 mg  100 mg Oral QHS Dallie Piles, RPH   100 mg at 12/09/21 2159   hydrALAZINE (APRESOLINE) injection 10 mg  10 mg Intravenous Q6H PRN Mansy, Jan A, MD       irbesartan (AVAPRO) tablet 75 mg  75 mg Oral Daily Mansy, Jan A, MD   75 mg at 12/09/21 2263   magnesium hydroxide (MILK OF MAGNESIA) suspension 30 mL  30 mL Oral Daily PRN Mansy, Jan A, MD       menthol-cetylpyridinium (CEPACOL) lozenge 3 mg  1 lozenge Oral PRN Mansy, Jan A, MD       multivitamin with minerals tablet 1 tablet  1 tablet Oral Daily Nicole Kindred A, DO   1 tablet at 12/09/21 0918   ondansetron (ZOFRAN) tablet 4 mg  4 mg Oral Q6H PRN Mansy, Jan A, MD       Or   ondansetron [facility]) injection 4 mg  4 mg Intravenous Q6H PRN Mansy, Jan A, MD       phenol (CHLORASEPTIC) mouth spray 1 spray  1 spray Mouth/Throat PRN Nicole Kindred A, DO   1 spray at 12/08/21 1431   traZODone (DESYREL) tablet 25 mg  25 mg Oral QHS PRN Mansy, Jan A, MD       zinc sulfate capsule 220 mg  220 mg Oral Daily Mansy, Jan A, MD   220 mg at 12/09/21 3354     Discharge Medications: Please see discharge summary for a list of discharge medications.  Relevant Imaging Results:  Relevant Lab Results:   Additional Information SSN:[id number]  Gerrianne Scale Godwin Tedesco, LCSW

## 2021-12-10 NOTE — Progress Notes (Signed)
Nutrition Follow-up  DOCUMENTATION CODES:   Not applicable  INTERVENTION:   -Continue with liberalized diet of regular to provide widest variety of meal selections in attempt to improve oral intake -Continue MVI with minerals daily -D/c Ensure Enlive -Boost Breeze po TID, each supplement provides 250 kcal and 9 grams of protein   NUTRITION DIAGNOSIS:   Inadequate oral intake related to acute illness as evidenced by per patient/family report.  Ongoing  GOAL:   Patient will meet greater than or equal to 90% of their needs  Progressing   MONITOR:   PO intake, Supplement acceptance, Labs, Weight trends, Skin, I & O's  REASON FOR ASSESSMENT:   Malnutrition Screening Tool    ASSESSMENT:   88 y/o female with h/o Parkinson's disease, CAD s/p CABG and HTN who is admitted with COVID 19 and UTI.  Reviewed I/O's: -1 L x 24 hours and -52 ml since admission  UOP: 1 L x 24 hours   Case discussed with MD; pt remains with poor oral intake and dislikes Ensure supplement. RD to re-eval for supplement preferences.   Spoke with pt daughter over the phone. She reports that pt still has very poor oral intake secondary to sore throat. Pt consumed some applesauce this morning and pt is tolerating softer, colder foods well. Daughter confirms pt's dislike for Ensure and is interested in trying Boost Breeze (peach flavor).   Medications reviewed and include vitamin C, sinemet, vitamin D3, and zinc sulfate.   Labs reviewed.   Diet Order:   Diet Order             Diet regular Room service appropriate? Yes; Fluid consistency: Thin  Diet effective now                   EDUCATION NEEDS:   Education needs have been addressed  Skin:  Skin Assessment: Reviewed RN Assessment  Last BM:  12/09/21  Height:   Ht Readings from Last 1 Encounters:  12/08/21 5\' 4"  (1.626 m)    Weight:   Wt Readings from Last 1 Encounters:  12/08/21 73.8 kg    Ideal Body Weight:  54.5 kg  BMI:   Body mass index is 27.91 kg/m.  Estimated Nutritional Needs:   Kcal:  1600-1800kcal/day  Protein:  80-90g/day  Fluid:  1.4-1.6L/day    Loistine Chance, RD, LDN, [place] Registered Dietitian II Certified Diabetes Care and Education Specialist Please refer to [facility] for RD and/or RD on-call/weekend/after hours pager

## 2021-12-10 NOTE — TOC Initial Note (Signed)
Transition of Care [facility]) - Initial/Assessment Note    Patient Details  Name: Kimberly Scott MRN: [id number] Date of Birth: May 05, 1928  Transition of Care [facility]) CM/SW Contact:    Eileen Stanford, LCSW Phone Number: 12/10/2021, 12:11 PM  Clinical Narrative:   CSW spoke with pt's daughter. Right now they are undetermined on dc plan. Per daughter pt lives alone and is far too weak to go back home alone, although the pt wants to dc back home.   CSW explained that if pt dc to SNF it would be 10 days after positive covid test. Pt's daughter understanding. As of right now with the strength pt has pts daughter states she would have to dc to SNF however, daughter would like to see how pt progresses in the case she could dc home with hh. Pt's daughter is going to talk to family as well.              Expected Discharge Plan:  (TBD) Barriers to Discharge: Continued Medical Work up   Patient Goals and CMS Choice Patient states their goals for this hospitalization and ongoing recovery are:: to get better and go home with home health or dc to snf   Choice offered to / list presented to : Adult Children  Expected Discharge Plan and Services Expected Discharge Plan:  (TBD) In-house Referral: Clinical Social Work   Post Acute Care Choice:  (TBD) Living arrangements for the past 2 months: Single Family Home                                      Prior Living Arrangements/Services Living arrangements for the past 2 months: Single Family Home Lives with:: Self Patient language and need for interpreter reviewed:: Yes Do you feel safe going back to the place where you live?: Yes      Need for Family Participation in Patient Care: Yes (Comment) Care giver support system in place?: Yes (comment)   Criminal Activity/Legal Involvement Pertinent to Current Situation/Hospitalization: No - Comment as needed  Activities of Daily Living Home Assistive Devices/Equipment: Cane (specify quad or  straight), Walker (specify type), Other (Comment) (Straight cane, rolling walker) ADL Screening (condition at time of admission) Patient's cognitive ability adequate to safely complete daily activities?: No Is the patient deaf or have difficulty hearing?: No Does the patient have difficulty seeing, even when wearing glasses/contacts?: No Does the patient have difficulty concentrating, remembering, or making decisions?: Yes Patient able to express need for assistance with ADLs?: No Does the patient have difficulty dressing or bathing?: Yes Independently performs ADLs?: Yes (appropriate for developmental age) Does the patient have difficulty walking or climbing stairs?: No Weakness of Legs: Both Weakness of Arms/Hands: Both  Permission Sought/Granted Permission sought to share information with : Family Supports Permission granted to share information with : Yes, Release of Information Signed  Share Information with NAME: Tye Maryland     Permission granted to share info w Relationship: daughter     Emotional Assessment Appearance:: Appears stated age Attitude/Demeanor/Rapport: Unable to Assess Affect (typically observed): Unable to Assess Orientation: : Oriented to Self, Oriented to Place Alcohol / Substance Use: Not Applicable Psych Involvement: No (comment)  Admission diagnosis:  UTI (urinary tract infection) [N39.0] Acute cystitis with hematuria [N30.01] Generalized weakness [R53.1] Patient Active Problem List   Diagnosis Date Noted   Generalized weakness 12/09/2021   Right shoulder pain 12/09/2021   COVID-19  virus infection 12/08/2021   Peripheral neuropathy 12/08/2021   Parkinson's disease ([place]) 12/08/2021   UTI (urinary tract infection) 07/31/2019   Hyponatremia 05/21/2018   TIA (transient ischemic attack)    Acute metabolic encephalopathy 02/72/5366   Coronary arteriosclerosis 09/28/2012   Hypertension 09/28/2012   Hyperlipidemia 09/28/2012   GERD (gastroesophageal reflux  disease) 09/28/2012   Breast cyst 09/28/2012   PCP:  Adin Hector, MD Pharmacy:   [facility] - [place], [address] [address] Phone: [phone] Fax: [phone]  [id number] - Cl[facility] - [place], [address] [place] [address] Phone: [phone] Fax: [phone]  [id number] - [place], [place] [address] Phone: [phone] Fax: [phone]     Social Determinants of Health (SDOH) Interventions    Readmission Risk Interventions No flowsheet data found.

## 2021-12-10 NOTE — Progress Notes (Signed)
Physical Therapy Treatment Patient Details Name: Kimberly Scott MRN: [id number] DOB: 03/27/1928 Today's Date: 12/10/2021   History of Present Illness Kimberly Scott is a 88 y.o. female with a history of Parkinson's disease, CAD status post CABG, hypertension who presents for generalized weakness.    PT Comments    Pt received in recliner agreeable to PT. Daughter present. Discussion throughout session on possible d/c to daughter's house with [facility] PT. Daughter reports having 3 STE but no railing with conversation on rail installation, use of gait belt, safe guarding. Education provided on assist level needed for home, mainly cuing for safe use of RW with transfers and gait. Pt is displaying improvement in safe transfers to standing with RW with no post lean or LoB consistently with 3 bouts with minguard. Progressed ambulation to 3 bouts of 20' in room with improved safety keeping RW to BOS. Does require intermittent cuing for RW proximity but overall improved safety and sequencing with RW today with good carryover after cues. Pt does require consistent multimodal cuing for correct ROM and performance of LE therex. Pt will benefit from stair training in following PT session to assess pt's safe ability to asc/desc stairs at daughter's house prior to d/c. Pt in recliner post session all needs in reach. D/c recs remain appropriate however pt is improving in safety. If pt d/c's to daughter's house, education was provided on pt's needs to daughter and assist/supervision required. Would benefit from [facility] PT if plan is to d/c to daughter's house.     Recommendations for follow up therapy are one component of a multi-disciplinary discharge planning process, led by the attending physician.  Recommendations may be updated based on patient status, additional functional criteria and insurance authorization.  Follow Up Recommendations  Skilled nursing-short term rehab (<3 hours/day)     Assistance  Recommended at Discharge Frequent or constant Supervision/Assistance  Patient can return home with the following A little help with walking and/or transfers;Direct supervision/assist for medications management;Assistance with cooking/housework;Assist for transportation;A little help with bathing/dressing/bathroom;Help with stairs or ramp for entrance   Equipment Recommendations  Other (comment) (railing for daughter's house for stairs if pt d/c to daughter's)    Recommendations for Other Services       Precautions / Restrictions Precautions Precautions: Fall Restrictions Weight Bearing Restrictions: No     Mobility  Bed Mobility               General bed mobility comments: NT. In recliner pre and post session. Patient Response: Cooperative;Flat affect  Transfers Overall transfer level: Needs assistance Equipment used: Rolling walker (2 wheels) Transfers: Sit to/from Stand Sit to Stand: Min guard           General transfer comment: Excellent carryover in ability to stand safely to RW. no post lean or LOB.    Ambulation/Gait Ambulation/Gait assistance: Min guard Gait Distance (Feet): 60 Feet (3x20' seated rest b/t bouts) Assistive device: Rolling walker (2 wheels) Gait Pattern/deviations: Step-through pattern;Trunk flexed;Decreased step length - right;Decreased step length - left       General Gait Details: Improved ability to keep RW clsoer to BOS. Does require intermittent, min VC's with good carryover after cuing. Remains kyphotic   Stairs             Wheelchair Mobility    Modified Rankin (Stroke Patients Only)       Balance Overall balance assessment: Needs assistance Sitting-balance support: Feet supported Sitting balance-Leahy Scale: Fair     Standing balance  support: Reliant on assistive device for balance;Bilateral upper extremity supported Standing balance-Leahy Scale: Fair Standing balance comment: use of RW                             Cognition Arousal/Alertness: Awake/alert Behavior During Therapy: WFL for tasks assessed/performed;Flat affect Overall Cognitive Status: Within Functional Limits for tasks assessed                                          Exercises General Exercises - Lower Extremity Ankle Circles/Pumps: AROM;Strengthening;Both;10 reps;Seated Long Arc Quad: AROM;Seated;Strengthening;Both;10 reps Hip Flexion/Marching: AROM;Seated;Strengthening;Both;10 reps Toe Raises: AROM;Seated;Strengthening;Both;10 reps Heel Raises: AROM;Seated;Strengthening;Both;10 reps Other Exercises Other Exercises: Education to daughter on physical assist/need/supervision needed if pt to d/c to daughter's house. Use of gait belt, installing rail for entering/exiting daughter's home.    General Comments        Pertinent Vitals/Pain Pain Assessment: No/denies pain    Home Living                          Prior Function            PT Goals (current goals can now be found in the care plan section) Acute Rehab PT Goals Patient Stated Goal: to go home PT Goal Formulation: With patient Time For Goal Achievement: 12/23/21 Potential to Achieve Goals: Fair Progress towards PT goals: Progressing toward goals    Frequency    Min 2X/week      PT Plan Current plan remains appropriate    Co-evaluation              AM-PAC PT "6 Clicks" Mobility   Outcome Measure  Help needed turning from your back to your side while in a flat bed without using bedrails?: A Lot Help needed moving from lying on your back to sitting on the side of a flat bed without using bedrails?: A Lot Help needed moving to and from a bed to a chair (including a wheelchair)?: A Little Help needed standing up from a chair using your arms (e.g., wheelchair or bedside chair)?: A Little Help needed to walk in hospital room?: A Little Help needed climbing 3-5 steps with a railing? : A Lot 6 Click Score:  15    End of Session Equipment Utilized During Treatment: Gait belt Activity Tolerance: Patient tolerated treatment well Patient left: in chair;with chair alarm set;with family/visitor present Nurse Communication: Mobility status PT Visit Diagnosis: Unsteadiness on feet (R26.81);Other abnormalities of gait and mobility (R26.89);Muscle weakness (generalized) (M62.81);Difficulty in walking, not elsewhere classified (R26.2)     Time: 9381-0175 PT Time Calculation (min) (ACUTE ONLY): 43 min  Charges:  $Gait Training: 8-22 mins $Therapeutic Exercise: 8-22 mins $Therapeutic Activity: 8-22 mins                    Aideen Fenster M. Fairly IV, PT, DPT Physical Therapist- [facility]  12/10/2021, 11:54 AM

## 2021-12-10 NOTE — Care Management Important Message (Signed)
Important Message  Patient Details  Name: Kimberly Scott MRN: [id number] Date of Birth: 25-Jul-1928   Medicare Important Message Given:  Yes  Reviewed Medi care IM with daughter, Tye Maryland, via room phone. Unable to deliver in person due to isolation status.  Copy of Medicare IM sent securely to email address provided by daughter: [email or web address]  Requested a copy be delivered to room as well.  Will leave copy at nursing station to be delivered by nursing staff.    Dannette Barbara 12/10/2021, 4:09 PM

## 2021-12-10 NOTE — Progress Notes (Signed)
Mobility Specialist - Progress Note   12/10/21 1700  Mobility  Activity Refused mobility  Mobility performed by Mobility specialist    Pt declined mobility, no reason specified. Pt requests to rest prior to MRI. Pt left in chair with needs in reach, daughter at bedside. Will attempt another date.   Kathee Delton Mobility Specialist 12/10/21, 5:20 PM

## 2021-12-10 NOTE — Progress Notes (Signed)
Progress Note   Patient: Kimberly Scott PNT:[id number] DOB: [birth year] DOA: 12/07/2021     3 DOS: the patient was seen and examined on 12/10/2021   Brief hospital course: 88 y.o. female with medical history significant for osteoarthritis, hypertension and angina, who presented to the ER with acute onset of cough productive of whitish sputum for the last couple of days with associated mild dyspnea and wheezing as well as sore throat with postnasal drip, fever and chills.  She denies any loss of taste.  She has chronic loss of smell.  She admits to mild lower abdominal pain with cough.   Workup in the ED revealed UA consistent with UTI, sent for culture.  Covid-19 PCR was positive.  Remdesivir was ordered, but declined by patient and family (patient also unvaccinated, family honoring her wishes to avoid vaccines or covid treatments).  Chest xray without PNA.  Started on empiric Rocephin and admitted to [facility] service.  Assessment and Plan * UTI (urinary tract infection)- (present on admission) Present on admission, UA was consistent with infection and patient had suprapubic abdominal pain that has improved with antibiotics. Urine culture grew pansensitive E. coli. -- Transitioned from IV Rocephin to oral Keflex to complete 5-day course   Acute metabolic encephalopathy- (present on admission) Present on admission, likely due to COVID infection and UTI.  Mental status has improved.   -- Delirium precautions -- Treat UTI  COVID-19 virus infection- (present on admission) Present on admission.  Chest x-ray negative for pneumonia patient and family declined remdesivir which was ordered on admission.  Per daughter, patient has declined all vaccines and COVID-specific treatment and they want to honor that wish. Patient is having headaches, sore throat and a dry cough, mildly symptomatic.  No hypoxia. -- Supportive care -- Monitor respiratory status and repeat chest x-ray if  worsening   Hypertension- (present on admission) BP uncontrolled on admission. Continue antihypertensives As needed hydralazine. 1/13: BP fairly stable with soft diastolics in the 08Q, systolic in the 761-950D.  Peripheral neuropathy Continue gabapentin  Hyperlipidemia- (present on admission) Continue statin  GERD (gastroesophageal reflux disease)- (present on admission) Appears not on a PPI.  Monitor  TIA (transient ischemic attack)- (present on admission) On aspirin and statin.  No acute neurologic symptoms.  Right shoulder pain Patient has new onset pain and weakness in her right shoulder since admission.  Suspect this is from requiring assistance with mobility in the right arm being pulled on.  Suspect she has a rotator cuff strain versus tear. Right shoulder x-ray negative for fracture or dislocation. -- Voltaren gel  --Acetaminophen as needed -- Right shoulder MRI pending to evaluate rotator cuff further --Low-dose IV Ativan for MRI Discussed with daughter patient's resting tremor in the right upper extremity, will impact quality of MRI imaging.  She reports tremor stops or significantly improved and patient sleeping and request medication to help with this. -- Caution with mobility assistance  Generalized weakness Patient lives alone and currently requiring significant assistance for any mobility. PT recommending SNF for rehab. TOC following. Patient and daughter hopeful her strength will improve so she is able to return home. Patient will require 24/7 assistance after discharge. --Fall precautions --Continue PT and OT --Out of bed, up in chair as much as possible   Parkinson's disease ([place])- (present on admission) Continue Sinemet  Coronary arteriosclerosis- (present on admission) Chronic, stable. Continue ASA, statin     Subjective: Patient awake sitting up in recliner when seen today.  Daughter at bedside.  She did better with therapy today than  yesterday.  She continues to have significant right shoulder pain which was exacerbated by using her right arm by feeding herself lunch today.  Patient continues have sore throat but otherwise denies COVID symptoms.  No other acute complaints.  Objective Vitals reviewed and notable for no fevers, stable blood pressure with soft diastolic in the 44R, no hypoxia with O2 sat 91 to 98% on room air.  General exam: awake, alert, no acute distress HEENT: atraumatic, clear conjunctiva, anicteric sclera, moist mucus membranes, hearing grossly normal  Respiratory system: CTAB, no wheezes, rales or rhonchi, normal respiratory effort. Cardiovascular system: normal S1/S2, RRR, nno pedal edema.   Gastrointestinal system: soft, NT, ND, no HSM felt, +bowel sounds. Central nervous system: no gross focal neurologic deficits, normal speech Extremities: Resting right upper extremity pill-rolling tremor, no edema, normal tone Skin: dry, intact, normal temperature Psychiatry: normal mood, flat affect, judgement and insight appear normal   Data Reviewed:    Family Communication: Daughter at bedside on rounds today  Disposition: Status is: Inpatient    Remains inpatient appropriate because: Patient requires SNF placement for short-term rehab after discharge unless able to return home with 24/7 assistance.  Evaluation of new onset shoulder pain and weakness is still underway.       Time spent: 35 minutes  Author: Ezekiel Slocumb, DO 12/10/2021 4:48 PM  For on call review [email or web address].

## 2021-12-11 MED ORDER — ASCORBIC ACID 500 MG PO TABS: 1000.0000 mg | ORAL_TABLET | Freq: Every day | ORAL | Status: AC

## 2021-12-11 MED ORDER — CEPHALEXIN 500 MG PO CAPS: 500.0000 mg | ORAL_CAPSULE | Freq: Two times a day (BID) | ORAL | 0 refills | Status: AC

## 2021-12-11 MED ORDER — ENSURE ENLIVE PO LIQD: 237.0000 mL | Freq: Three times a day (TID) | ORAL | 0 refills | Status: AC

## 2021-12-11 MED ORDER — DICLOFENAC SODIUM 1 % EX GEL: 2.0000 g | Freq: Four times a day (QID) | CUTANEOUS | 1 refills | Status: AC

## 2021-12-11 MED ORDER — ADULT MULTIVITAMIN W/MINERALS CH: 1.0000 | ORAL_TABLET | Freq: Every day | ORAL | Status: AC

## 2021-12-11 MED ORDER — ACETAMINOPHEN 325 MG PO TABS: 650.0000 mg | ORAL_TABLET | Freq: Four times a day (QID) | ORAL | Status: AC | PRN

## 2021-12-11 MED ORDER — PHENOL 1.4 % MT LIQD: 1.0000 | OROMUCOSAL | 0 refills | Status: DC | PRN

## 2021-12-11 NOTE — Discharge Summary (Signed)
Physician Discharge Summary   Patient: Kimberly Scott MRN: [id number] DOB: July 30, 1928  Admit date:     12/07/2021  Discharge date: 12/11/21  Discharge Physician: Ezekiel Slocumb   PCP: Adin Hector, MD   Recommendations at discharge:    Follow up with PCP in 1-2 weeks Follow up CBC/CMP in 1-2 weeks  Discharge Diagnoses Principal Problem:   UTI (urinary tract infection) Active Problems:   Acute metabolic encephalopathy   COVID-19 virus infection   Hypertension   Peripheral neuropathy   Hyperlipidemia   GERD (gastroesophageal reflux disease)   TIA (transient ischemic attack)   Coronary arteriosclerosis   Parkinson's disease ([place])   Generalized weakness   Right shoulder pain    Hospital Course   88 y.o. female with medical history significant for osteoarthritis, hypertension and angina, who presented to the ER with acute onset of cough productive of whitish sputum for the last couple of days with associated mild dyspnea and wheezing as well as sore throat with postnasal drip, fever and chills.  She denies any loss of taste.  She has chronic loss of smell.  She admits to mild lower abdominal pain with cough.   Workup in the ED revealed UA consistent with UTI, sent for culture.  Covid-19 PCR was positive.  Remdesivir was ordered, but declined by patient and family (patient also unvaccinated, family honoring her wishes to avoid vaccines or covid treatments).  Chest xray without PNA.  Started on empiric Rocephin and admitted to [facility] service.  * UTI (urinary tract infection)- (present on admission) Present on admission, UA was consistent with infection and patient had suprapubic abdominal pain that has improved with antibiotics. Urine culture grew pansensitive E. coli. -- Transitioned from IV Rocephin to oral Keflex to complete 5-day course   Acute metabolic encephalopathy- (present on admission) Present on admission, likely due to COVID infection and UTI.  Mental  status has improved.   -- Delirium precautions -- Treat UTI  COVID-19 virus infection- (present on admission) Present on admission.  Chest x-ray negative for pneumonia patient and family declined remdesivir which was ordered on admission.  Per daughter, patient has declined all vaccines and COVID-specific treatment and they want to honor that wish. Patient is having headaches, sore throat and a dry cough, mildly symptomatic.  No hypoxia. -- Supportive care -- Monitor respiratory status and repeat chest x-ray if worsening   Hypertension- (present on admission) BP uncontrolled on admission. Continue antihypertensives As needed hydralazine. 1/13: BP fairly stable with soft diastolics in the 23J, systolic in the 628-315V.  Peripheral neuropathy Continue gabapentin  Hyperlipidemia- (present on admission) Continue statin  GERD (gastroesophageal reflux disease)- (present on admission) Appears not on a PPI.  Monitor  TIA (transient ischemic attack)- (present on admission) On aspirin and statin.  No acute neurologic symptoms.  Right shoulder pain Patient has new onset pain and weakness in her right shoulder since admission.  Suspect this is from requiring assistance with mobility in the right arm being pulled on.  Suspect she has a rotator cuff strain versus tear. Right shoulder x-ray negative for fracture or dislocation. MRI of the shoulder confirms rotator cuff tears. Discussed with orthopedic surgeon on call, they reviewed the MRI and feel this is likely a chronic issue, and patient is unlikely a candidate for surgical repair. -- Ortho recommends -  Sling, tylenol and/or NSAIDs if tolerated as needed.   Outpatient [facility] or [facility] if pain persists.  -- Voltaren gel, Tylenol  -- Caution with  mobility assistance -- Home health PT/OT arranged    Generalized weakness Patient lives alone and currently requiring significant assistance for any mobility. PT recommending SNF for  rehab. TOC following. Patient and daughter hopeful her strength will improve so she is able to return home. Patient will require 24/7 assistance after discharge. --Fall precautions --Continue PT and OT --Out of bed, up in chair as much as possible   Parkinson's disease ([place])- (present on admission) Continue Sinemet  Coronary arteriosclerosis- (present on admission) Chronic, stable. Continue ASA, statin        Consultants: none Procedures performed: none  Disposition: Home Diet recommendation: Regular diet  DISCHARGE MEDICATION: Allergies as of 12/11/2021       Reactions   Bacitracin-neomycin-polymyxin Swelling, Rash   Blisters Blisters   Ace Inhibitors    Other reaction(s): Cough   Omega 3  [fish Oil] Other (See Comments)   Neosporin [neomycin-bacitracin Zn-polymyx] Rash   Tape Rash   Blisters        Medication List     STOP taking these medications    ondansetron 4 MG tablet Commonly known as: ZOFRAN       TAKE these medications    acetaminophen 325 MG tablet Commonly known as: TYLENOL Take 2 tablets (650 mg total) by mouth every 6 (six) hours as needed for mild pain, headache or fever.   ascorbic acid 500 MG tablet Commonly known as: VITAMIN C Take 2 tablets (1,000 mg total) by mouth daily.   aspirin EC 81 MG tablet Take 81 mg by mouth daily.   atorvastatin 40 MG tablet Commonly known as: LIPITOR Take 1 tablet (40 mg total) by mouth daily at 6 PM.   carbidopa-levodopa 25-100 MG tablet Commonly known as: SINEMET IR Take 2 tablets by mouth as directed. Take 2 tabs in the morning and 1 tablet at noon and dinner time   cephALEXin 500 MG capsule Commonly known as: KEFLEX Take 1 capsule (500 mg total) by mouth every 12 (twelve) hours for 3 doses.   cholecalciferol 1000 units tablet Commonly known as: VITAMIN D Take 1,000 Units by mouth daily.   cyanocobalamin 1000 MCG/ML injection Commonly known as: (VITAMIN B-12) Inject 1,000 mcg into  the muscle every 30 (thirty) days. Last dose 11/20/21   diclofenac Sodium 1 % Gel Commonly known as: VOLTAREN Apply 2 g topically 4 (four) times daily.   feeding supplement Liqd Take 237 mLs by mouth 3 (three) times daily between meals. What changed: when to take this   gabapentin 100 MG capsule Commonly known as: NEURONTIN Take 100 mg by mouth at bedtime.   multivitamin with minerals Tabs tablet Take 1 tablet by mouth daily. Start taking on: December 12, 2021   phenol 1.4 % Liqd Commonly known as: CHLORASEPTIC Use as directed 1 spray in the mouth or throat as needed for throat irritation / pain.   telmisartan 20 MG tablet Commonly known as: MICARDIS Take 20 mg by mouth daily.   triamcinolone ointment 0.1 % Commonly known as: KENALOG Apply 1 application topically 2 (two) times daily as needed.               Durable Medical Equipment  (From admission, onward)           Start     Ordered   12/10/21 1454  For home use only DME Walker rolling  Once       Question Answer Comment  Walker: With 5 Inch Wheels   Patient needs a walker  to treat with the following condition Unsteady gait      12/10/21 1454             Discharge Exam: Filed Weights   12/07/21 0857 12/08/21 1515  Weight: 77.1 kg 73.8 kg   General exam: awake, alert, no acute distress HEENT: atraumatic, clear conjunctiva, anicteric sclera, moist mucus membranes, hearing grossly normal  Respiratory system: CTAB, no wheezes, rales or rhonchi, normal respiratory effort. Cardiovascular system: normal S1/S2,  RRR, no JVD, murmurs, rubs, gallops,  no pedal edema.   Gastrointestinal system: soft, NT, ND, no HSM felt, +bowel sounds. Central nervous system: no gross focal neurologic deficits, normal speech Extremities: moves all , no edema, normal tone Skin: dry, intact, normal temperature Psychiatry: normal mood, congruent affec   Condition at discharge: stable  The results of significant  diagnostics from this hospitalization (including imaging, microbiology, ancillary and laboratory) are listed below for reference.   Imaging Studies: CT Head Wo Contrast  Result Date: 12/07/2021 CLINICAL DATA:  Mental status change with unknown cause EXAM: CT HEAD WITHOUT CONTRAST TECHNIQUE: Contiguous axial images were obtained from the base of the skull through the vertex without intravenous contrast. COMPARISON:  07/19/2019 FINDINGS: Brain: No evidence of acute infarction, hemorrhage, hydrocephalus, extra-axial collection or mass lesion/mass effect. Low-density in the hemispheric white matter that is confluent. Mild for age cerebral volume loss. Vascular: No hyperdense vessel or unexpected calcification. Skull: Normal. Negative for fracture or focal lesion. Sinuses/Orbits: No acute finding. IMPRESSION: No acute or reversible finding. Confluent chronic small vessel ischemia. Electronically Signed   By: Jorje Guild M.D.   On: 12/07/2021 06:33   MR SHOULDER RIGHT WO CONTRAST  Result Date: 12/11/2021 CLINICAL DATA:  Right shoulder pain. EXAM: MRI OF THE RIGHT SHOULDER WITHOUT CONTRAST TECHNIQUE: Multiplanar, multisequence MR imaging of the shoulder was performed. No intravenous contrast was administered. COMPARISON:  Radiographs 12/09/2021 FINDINGS: Examination is quite limited due to patient motion. Rotator cuff: Partial-thickness articular surface tears involving the infraspinatus and supraspinatus tendons with approximately 15 mm of laminar retraction of the articular fibers. Full-thickness retracted tear of the supraspinatus tendon more anteriorly. Maximum retraction is estimated at 19 mm. The tear is approximately 12 mm wide. The subscapularis tendon is grossly intact. Muscles:  Mild diffuse fatty atrophy of the shoulder musculature. Biceps long head: Difficult to identified the intra-articular portion of the tendon but study is quite limited. I suspect it is torn and retracted. Acromioclavicular  Joint: Mild degenerative changes. Type 2 acromion. No lateral downsloping or subacromial spurring. Glenohumeral Joint: Moderate degenerative changes. Labrum:  Limited evaluation but no obvious tear. Bones:  No acute bony findings. Other: Expected fluid in the subacromial/subdeltoid bursa. IMPRESSION: 1. Examination is quite limited due to patient motion. 2. Partial-thickness articular surface tears involving the infraspinatus and supraspinatus tendons. There is also a full-thickness retracted tear of the supraspinatus tendon more anteriorly. 3. Suspect torn and retracted long head biceps tendon. 4. Moderate glenohumeral joint degenerative changes. Electronically Signed   By: Marijo Sanes M.D.   On: 12/11/2021 11:13   DG Chest Portable 1 View  Result Date: 12/07/2021 CLINICAL DATA:  Hypoxia EXAM: PORTABLE CHEST 1 VIEW COMPARISON:  06/30/2021 FINDINGS: Cardiomegaly and aortic tortuosity. CABG. There is no edema, consolidation, effusion, or pneumothorax. IMPRESSION: No acute finding when compared to prior. Electronically Signed   By: Jorje Guild M.D.   On: 12/07/2021 05:32   DG Shoulder Right Port  Result Date: 12/09/2021 CLINICAL DATA:  Pain EXAM: RIGHT SHOULDER - 1  VIEW COMPARISON:  None. FINDINGS: No recent fracture or dislocation is seen. There are no abnormal soft tissue calcifications metallic IMPRESSION: No fracture or dislocation is seen in the right shoulder. There are no abnormal soft tissue calcifications. Electronically Signed   By: Elmer Picker M.D.   On: 12/09/2021 12:30    Microbiology: Results for orders placed or performed during the hospital encounter of 12/07/21  Urine Culture     Status: Abnormal   Collection Time: 12/07/21  2:16 AM   Specimen: Urine, Clean Catch  Result Value Ref Range Status   Specimen Description   Final    URINE, CLEAN CATCH Performed at [facility], [address], [place], [place] [address]    Special Requests   Final     NONE Performed at [facility], [address] [place], [place] [address]    Culture >=100,000 COLONIES/mL ESCHERICHIA COLI (A)  Final   Report Status 12/09/2021 FINAL  Final   Organism ID, Bacteria ESCHERICHIA COLI (A)  Final      Susceptibility   Escherichia coli - MIC*    AMPICILLIN 4 SENSITIVE Sensitive     CEFAZOLIN <=4 SENSITIVE Sensitive     CEFEPIME <=0.12 SENSITIVE Sensitive     CEFTRIAXONE <=0.25 SENSITIVE Sensitive     CIPROFLOXACIN <=0.25 SENSITIVE Sensitive     GENTAMICIN <=1 SENSITIVE Sensitive     IMIPENEM <=0.25 SENSITIVE Sensitive     NITROFURANTOIN <=16 SENSITIVE Sensitive     TRIMETH/SULFA <=20 SENSITIVE Sensitive     AMPICILLIN/SULBACTAM <=2 SENSITIVE Sensitive     PIP/TAZO <=4 SENSITIVE Sensitive     * >=100,000 COLONIES/mL ESCHERICHIA COLI  Resp Panel by RT-PCR (Flu A&B, Covid) Nasopharyngeal Swab     Status: Abnormal   Collection Time: 12/07/21  5:39 AM   Specimen: Nasopharyngeal Swab; Nasopharyngeal(NP) swabs in vial transport medium  Result Value Ref Range Status   SARS Coronavirus 2 by RT PCR POSITIVE (A) NEGATIVE Final    Comment: (NOTE) SARS-CoV-2 target nucleic acids are DETECTED.  The SARS-CoV-2 RNA is generally detectable in upper respiratory specimens during the acute phase of infection. Positive results are indicative of the presence of the identified virus, but do not rule out bacterial infection or co-infection with other pathogens not detected by the test. Clinical correlation with patient history and other diagnostic information is necessary to determine patient infection status. The expected result is Negative.  Fact Sheet for Patients: EntrepreneurPulse.com.au  Fact Sheet for Healthcare Providers: IncredibleEmployment.be  This test is not yet approved or cleared by the [place] FDA and  has been authorized for detection and/or diagnosis of SARS-CoV-2 by FDA under an Emergency Use  Authorization (EUA).  This EUA will remain in effect (meaning this test can be used) for the duration of  the COVID-19 declaration under Section 564(b)(1) of the A ct, 21 U.S.C. section 360bbb-3(b)(1), unless the authorization is terminated or revoked sooner.     Influenza A by PCR NEGATIVE NEGATIVE Final   Influenza B by PCR NEGATIVE NEGATIVE Final    Comment: (NOTE) The Xpert Xpress SARS-CoV-2/FLU/RSV plus assay is intended as an aid in the diagnosis of influenza from Nasopharyngeal swab specimens and should not be used as a sole basis for treatment. Nasal washings and aspirates are unacceptable for Xpert Xpress SARS-CoV-2/FLU/RSV testing.  Fact Sheet for Patients: EntrepreneurPulse.com.au  Fact Sheet for Healthcare Providers: IncredibleEmployment.be  This test is not yet approved or cleared by the [place] and has been authorized for  detection and/or diagnosis of SARS-CoV-2 by FDA under an Emergency Use Authorization (EUA). This EUA will remain in effect (meaning this test can be used) for the duration of the COVID-19 declaration under Section 564(b)(1) of the Act, 21 U.S.C. section 360bbb-3(b)(1), unless the authorization is terminated or revoked.  Performed at [facility], [address] [place], [place] [address]   Blood culture (single)     Status: None (Preliminary result)   Collection Time: 12/07/21  7:05 AM   Specimen: BLOOD  Result Value Ref Range Status   Specimen Description BLOOD [facility]  Final   Special Requests   Final    BOTTLES DRAWN AEROBIC AND ANAEROBIC Blood Culture results may not be optimal due to an inadequate volume of blood received in culture bottles   Culture   Final    NO GROWTH 4 DAYS Performed at [facility], [address] [place], [place] [address]    Report Status PENDING  Incomplete    Labs: CBC: Recent Labs  Lab 12/06/21 1416 12/08/21 0548 12/09/21 0529  12/10/21 0543  WBC 6.1 5.5 4.6 4.3  NEUTROABS  --  2.9 1.8 1.8  HGB 12.9 11.5* 12.3 11.9*  HCT 39.5 34.4* 36.7 35.1*  MCV 95.4 90.1 90.6 90.9  PLT 181 159 165 027   Basic Metabolic Panel: Recent Labs  Lab 12/06/21 2151 12/08/21 0548 12/09/21 0529 12/10/21 0543  NA 134* 132* 134* 136  K 4.7 3.8 4.3 4.2  CL 102 99 103 104  CO2 24 23 24 24   GLUCOSE 111* 103* 106* 112*  BUN 19 17 18 16   CREATININE 1.13* 0.91 0.99 0.88  CALCIUM 8.7* 7.6* 8.1* 8.5*  MG  --   --  1.8 1.8   Liver Function Tests: Recent Labs  Lab 12/08/21 0548 12/09/21 0529 12/10/21 0543  AST 45* 45* 42*  ALT 10 12 13   ALKPHOS 58 61 56  BILITOT 0.5 0.3 0.3  PROT 6.0* 6.3* 6.1*  ALBUMIN 3.0* 3.1* 2.9*   CBG: No results for input(s): GLUCAP in the last 168 hours.  Discharge time spent: less than 30 minutes.  Signed: Ezekiel Slocumb, DO [facility] 12/11/2021

## 2021-12-11 NOTE — TOC Progression Note (Addendum)
Transition of Care [facility]) - Progression Note    Patient Details  Name: Kimberly Scott MRN: [id number] Date of Birth: 31-Dec-1927  Transition of Care [facility]) CM/SW Contact  Izola Price, RN Phone Number: 12/11/2021, 2:39 PM  Clinical Narrative: Patient to be discharged to home with [facility] today. Daughter informed and is a bit upset as she thought they were going to take another day to see how she progressed. Her husband will be home later today and she needs his help with patient especially getting out of car into house. Rolling walker had not been delivered and [employer] was notified per [facility] to have it delivered to room before discharge. Updated Unit RN re daughter's concerns. Simmie Davies RN CM     445pm RW delivered to room, [facility] arranged for transport to daughter's home address at [address] [place], [place]. Simmie Davies RN CM   Expected Discharge Plan:  (TBD) Barriers to Discharge: Continued Medical Work up  Expected Discharge Plan and Services Expected Discharge Plan:  (TBD) In-house Referral: Clinical Social Work   Post Acute Care Choice:  (TBD) Living arrangements for the past 2 months: Single Family Home Expected Discharge Date: 12/11/21                                     Social Determinants of Health (SDOH) Interventions    Readmission Risk Interventions No flowsheet data found.

## 2021-12-11 NOTE — Evaluation (Signed)
Physical Therapy Evaluation Patient Details Name: Kimberly Scott MRN: [id number] DOB: 1927/12/22 Today's Date: 12/11/2021  History of Present Illness  Kimberly Scott is a 88 y.o. female with a history of Parkinson's disease, CAD status post CABG, hypertension who presents for generalized weakness.   Clinical Impression  Pt received in recliner agreeable to PT. Daughter present with 12" step stool stating pt needs to practice getting into bed due to bed is elevated at daughter's house. Pt able to stand with minguard to RW with cuing for hand placement and step transfers to EOB with bed elevated to approximate height. 12" stool placed in front of pt with UE's on bed side and stool stabilized with author's foot. Pt required minA for LE's onto stool due to stool being so tall (12"). But once LE's on Stool pt able to safely asc buttocks onto bed without difficulty and minguard from PT. PT educated daughter on 6-8" step due to pt's hip mobility. Pt able to stand to RW from elevated bed height with minguard to W/c for transport to PT gym for stair training. PT demo'ed technique prior to completion. Pt able to perform asc/desc with L hand rail use with step to pattern and minguard. Does take increased time to perform but no physical assist required. Pt returned to recliner and transported back to room ambulating from W/c to recliner with great RW sequencing with turning and sitting. All needs in reach, education completed. Pt safe to d/c home to daughter's house with [facility] PT. Continue to rec 24/7 supervision/assist with OOB mobility due to decreased safety awareness and intermittent, incorrect use of DME.      Recommendations for follow up therapy are one component of a multi-disciplinary discharge planning process, led by the attending physician.  Recommendations may be updated based on patient status, additional functional criteria and insurance authorization.  Follow Up Recommendations Home health  PT    Assistance Recommended at Discharge  24/7 supervision/assist  Patient can return home with the following  A little help with walking and/or transfers;Direct supervision/assist for medications management;Assistance with cooking/housework;Assist for transportation;A little help with bathing/dressing/bathroom;Help with stairs or ramp for entrance    Equipment Recommendations Rolling walker (2 wheels)  Recommendations for Other Services       Functional Status Assessment Patient has had a recent decline in their functional status and demonstrates the ability to make significant improvements in function in a reasonable and predictable amount of time.     Precautions / Restrictions Precautions Precautions: Fall Restrictions Weight Bearing Restrictions: No      Mobility  Bed Mobility               General bed mobility comments: NT in recliner pre/post session Patient Response: Cooperative;Flat affect  Transfers Overall transfer level: Needs assistance Equipment used: Rolling walker (2 wheels) Transfers: Sit to/from Stand Sit to Stand: Min guard           General transfer comment: CGA for safety    Ambulation/Gait Ambulation/Gait assistance: Min guard;Supervision Gait Distance (Feet): 30 Feet Assistive device: Rolling walker (2 wheels) Gait Pattern/deviations: Step-through pattern;Trunk flexed;Decreased step length - right;Decreased step length - left Gait velocity: decreased     General Gait Details: Improved ability to keep RW clsoer to BOS. Does require intermittent, min VC's with good carryover after cuing. Remains kyphotic  Stairs   Stairs assistance: Min guard Stair Management: One rail Left;Step to pattern;Sideways Number of Stairs: 4 General stair comments: Performed with railing on L as  railing was installed on L side  Wheelchair Mobility    Modified Rankin (Stroke Patients Only)       Balance Overall balance assessment: Needs  assistance Sitting-balance support: Feet supported Sitting balance-Leahy Scale: Fair     Standing balance support: Reliant on assistive device for balance;Bilateral upper extremity supported Standing balance-Leahy Scale: Fair Standing balance comment: use of RW                             Pertinent Vitals/Pain Pain Assessment: No/denies pain    Home Living Family/patient expects to be discharged to:: Private residence Living Arrangements: Alone Available Help at Discharge: Family;Available PRN/intermittently Type of Home: House Home Access: Stairs to enter Entrance Stairs-Rails: Right Entrance Stairs-Number of Steps: 4   Home Layout: One level Home Equipment: Conservation officer, nature (2 wheels);Cane - single point;Shower seat;Grab bars - tub/shower;Rollator (4 wheels)      Prior Function Prior Level of Function : Needs assist       Physical Assist : ADLs (physical);Mobility (physical) Mobility (physical): Gait ADLs (physical): Bathing;IADLs Mobility Comments: Use of SPC for ambulation at baseline ADLs Comments: Daughter assists in bathing and meal prep     Hand Dominance   Dominant Hand: Right    Extremity/Trunk Assessment   Upper Extremity Assessment Upper Extremity Assessment: Generalized weakness    Lower Extremity Assessment Lower Extremity Assessment: Generalized weakness    Cervical / Trunk Assessment Cervical / Trunk Assessment: Kyphotic  Communication   Communication: HOH  Cognition Arousal/Alertness: Awake/alert Behavior During Therapy: WFL for tasks assessed/performed;Flat affect Overall Cognitive Status: Within Functional Limits for tasks assessed                                          General Comments      Exercises Other Exercises Other Exercises: Use of step stool to enter bed as pt's daughter's bed is elevated high up. Pt and daughter educated on safe performance. ABle to complete with minguard.   Assessment/Plan     PT Assessment Patient needs continued PT services  PT Problem List Decreased strength;Decreased range of motion;Decreased knowledge of use of DME;Decreased activity tolerance;Decreased safety awareness;Decreased balance;Decreased mobility       PT Treatment Interventions DME instruction;Balance training;Gait training;Neuromuscular re-education;Stair training;Functional mobility training;Patient/family education;Therapeutic activities;Therapeutic exercise    PT Goals (Current goals can be found in the Care Plan section)  Acute Rehab PT Goals Patient Stated Goal: to go home PT Goal Formulation: With patient Time For Goal Achievement: 12/23/21 Potential to Achieve Goals: Fair    Frequency Min 2X/week     Co-evaluation               AM-PAC PT "6 Clicks" Mobility  Outcome Measure Help needed turning from your back to your side while in a flat bed without using bedrails?: A Lot Help needed moving from lying on your back to sitting on the side of a flat bed without using bedrails?: A Lot Help needed moving to and from a bed to a chair (including a wheelchair)?: A Little Help needed standing up from a chair using your arms (e.g., wheelchair or bedside chair)?: A Little Help needed to walk in hospital room?: A Little Help needed climbing 3-5 steps with a railing? : A Little 6 Click Score: 16    End of Session Equipment Utilized During Treatment:  Gait belt Activity Tolerance: Patient tolerated treatment well Patient left: in chair;with chair alarm set;with family/visitor present Nurse Communication: Mobility status PT Visit Diagnosis: Unsteadiness on feet (R26.81);Other abnormalities of gait and mobility (R26.89);Muscle weakness (generalized) (M62.81);Difficulty in walking, not elsewhere classified (R26.2)    Time: 1230-1320 PT Time Calculation (min) (ACUTE ONLY): 50 min   Charges:     PT Treatments $Gait Training: 38-52 mins        Salem Caster. Fairly IV, PT,  DPT Physical Therapist- [facility]  12/11/2021, 1:36 PM

## 2021-12-12 LAB — CULTURE, BLOOD (SINGLE): Culture: NO GROWTH

## 2022-04-13 ENCOUNTER — Other Ambulatory Visit: Payer: Self-pay | Admitting: Neurology

## 2022-04-13 DIAGNOSIS — M542 Cervicalgia: Secondary | ICD-10-CM

## 2022-04-28 ENCOUNTER — Emergency Department
Admission: EM | Admit: 2022-04-28 | Discharge: 2022-04-28 | Discharge status: Against Medical Advice | Payer: Medicare Other

## 2022-04-28 NOTE — ED Notes (Addendum)
Pt family to patient relations desk and state they will be going to a different hospital.

## 2022-04-29 ENCOUNTER — Ambulatory Visit
Admission: RE | Admit: 2022-04-29 | Discharge: 2022-04-29 | Disposition: A | Discharge status: Home / Self Care | Payer: Medicare Other | Source: Ambulatory Visit | Attending: Neurology | Admitting: Neurology

## 2022-04-29 DIAGNOSIS — M542 Cervicalgia: Secondary | ICD-10-CM | POA: Insufficient documentation

## 2022-05-01 DIAGNOSIS — R7303 Prediabetes: Secondary | ICD-10-CM | POA: Insufficient documentation

## 2023-02-15 ENCOUNTER — Other Ambulatory Visit: Payer: Self-pay | Admitting: Internal Medicine

## 2023-02-15 DIAGNOSIS — Y92009 Unspecified place in unspecified non-institutional (private) residence as the place of occurrence of the external cause: Secondary | ICD-10-CM

## 2023-02-15 DIAGNOSIS — R410 Disorientation, unspecified: Secondary | ICD-10-CM

## 2023-02-16 ENCOUNTER — Ambulatory Visit
Admission: RE | Admit: 2023-02-16 | Discharge: 2023-02-16 | Disposition: A | Discharge status: Home / Self Care | Payer: Medicare Other | Source: Ambulatory Visit | Attending: Internal Medicine | Admitting: Internal Medicine

## 2023-02-16 DIAGNOSIS — Y92009 Unspecified place in unspecified non-institutional (private) residence as the place of occurrence of the external cause: Secondary | ICD-10-CM | POA: Insufficient documentation

## 2023-02-16 DIAGNOSIS — I6782 Cerebral ischemia: Secondary | ICD-10-CM | POA: Insufficient documentation

## 2023-02-16 DIAGNOSIS — G319 Degenerative disease of nervous system, unspecified: Secondary | ICD-10-CM | POA: Insufficient documentation

## 2023-02-16 DIAGNOSIS — R410 Disorientation, unspecified: Secondary | ICD-10-CM | POA: Diagnosis present

## 2023-02-16 DIAGNOSIS — W19XXXA Unspecified fall, initial encounter: Secondary | ICD-10-CM | POA: Diagnosis not present

## 2023-03-24 ENCOUNTER — Inpatient Hospital Stay
Admission: EM | Admit: 2023-03-24 | Discharge: 2023-03-29 | DRG: 690 | Disposition: A | Discharge status: Skilled Nursing Facility | Payer: Medicare Other | Attending: Internal Medicine | Admitting: Internal Medicine

## 2023-03-24 ENCOUNTER — Emergency Department: Payer: Medicare Other

## 2023-03-24 ENCOUNTER — Other Ambulatory Visit: Payer: Self-pay

## 2023-03-24 ENCOUNTER — Inpatient Hospital Stay: Payer: Medicare Other

## 2023-03-24 DIAGNOSIS — Y92009 Unspecified place in unspecified non-institutional (private) residence as the place of occurrence of the external cause: Secondary | ICD-10-CM | POA: Diagnosis not present

## 2023-03-24 DIAGNOSIS — Z90710 Acquired absence of both cervix and uterus: Secondary | ICD-10-CM

## 2023-03-24 DIAGNOSIS — I2510 Atherosclerotic heart disease of native coronary artery without angina pectoris: Secondary | ICD-10-CM | POA: Diagnosis present

## 2023-03-24 DIAGNOSIS — Z888 Allergy status to other drugs, medicaments and biological substances status: Secondary | ICD-10-CM

## 2023-03-24 DIAGNOSIS — Z1612 Extended spectrum beta lactamase (ESBL) resistance: Secondary | ICD-10-CM | POA: Diagnosis not present

## 2023-03-24 DIAGNOSIS — R4181 Age-related cognitive decline: Secondary | ICD-10-CM | POA: Diagnosis present

## 2023-03-24 DIAGNOSIS — N3000 Acute cystitis without hematuria: Secondary | ICD-10-CM | POA: Diagnosis not present

## 2023-03-24 DIAGNOSIS — N390 Urinary tract infection, site not specified: Secondary | ICD-10-CM | POA: Diagnosis not present

## 2023-03-24 DIAGNOSIS — Z96659 Presence of unspecified artificial knee joint: Secondary | ICD-10-CM | POA: Diagnosis present

## 2023-03-24 DIAGNOSIS — Z951 Presence of aortocoronary bypass graft: Secondary | ICD-10-CM | POA: Diagnosis not present

## 2023-03-24 DIAGNOSIS — G20C Parkinsonism, unspecified: Secondary | ICD-10-CM | POA: Diagnosis not present

## 2023-03-24 DIAGNOSIS — Z22358 Carrier of other enterobacterales: Secondary | ICD-10-CM | POA: Diagnosis not present

## 2023-03-24 DIAGNOSIS — R1310 Dysphagia, unspecified: Secondary | ICD-10-CM | POA: Diagnosis present

## 2023-03-24 DIAGNOSIS — I10 Essential (primary) hypertension: Secondary | ICD-10-CM | POA: Diagnosis present

## 2023-03-24 DIAGNOSIS — Z881 Allergy status to other antibiotic agents status: Secondary | ICD-10-CM

## 2023-03-24 DIAGNOSIS — Z91048 Other nonmedicinal substance allergy status: Secondary | ICD-10-CM | POA: Diagnosis not present

## 2023-03-24 DIAGNOSIS — M533 Sacrococcygeal disorders, not elsewhere classified: Secondary | ICD-10-CM | POA: Diagnosis present

## 2023-03-24 DIAGNOSIS — R4182 Altered mental status, unspecified: Secondary | ICD-10-CM

## 2023-03-24 DIAGNOSIS — N952 Postmenopausal atrophic vaginitis: Secondary | ICD-10-CM | POA: Diagnosis present

## 2023-03-24 DIAGNOSIS — F0390 Unspecified dementia without behavioral disturbance: Secondary | ICD-10-CM | POA: Diagnosis not present

## 2023-03-24 DIAGNOSIS — G459 Transient cerebral ischemic attack, unspecified: Secondary | ICD-10-CM | POA: Diagnosis present

## 2023-03-24 DIAGNOSIS — Z87440 Personal history of urinary (tract) infections: Secondary | ICD-10-CM

## 2023-03-24 DIAGNOSIS — M1990 Unspecified osteoarthritis, unspecified site: Secondary | ICD-10-CM | POA: Diagnosis present

## 2023-03-24 DIAGNOSIS — W19XXXA Unspecified fall, initial encounter: Secondary | ICD-10-CM

## 2023-03-24 DIAGNOSIS — Z1619 Resistance to other specified beta lactam antibiotics: Secondary | ICD-10-CM | POA: Diagnosis present

## 2023-03-24 DIAGNOSIS — Z8616 Personal history of COVID-19: Secondary | ICD-10-CM

## 2023-03-24 DIAGNOSIS — R296 Repeated falls: Secondary | ICD-10-CM | POA: Diagnosis present

## 2023-03-24 DIAGNOSIS — E860 Dehydration: Secondary | ICD-10-CM | POA: Diagnosis present

## 2023-03-24 DIAGNOSIS — Z7982 Long term (current) use of aspirin: Secondary | ICD-10-CM

## 2023-03-24 DIAGNOSIS — Z8701 Personal history of pneumonia (recurrent): Secondary | ICD-10-CM

## 2023-03-24 DIAGNOSIS — G20A1 Parkinson's disease without dyskinesia, without mention of fluctuations: Secondary | ICD-10-CM | POA: Diagnosis present

## 2023-03-24 DIAGNOSIS — Z85828 Personal history of other malignant neoplasm of skin: Secondary | ICD-10-CM

## 2023-03-24 DIAGNOSIS — R531 Weakness: Secondary | ICD-10-CM | POA: Diagnosis not present

## 2023-03-24 DIAGNOSIS — Z79899 Other long term (current) drug therapy: Secondary | ICD-10-CM | POA: Diagnosis not present

## 2023-03-24 DIAGNOSIS — B9620 Unspecified Escherichia coli [E. coli] as the cause of diseases classified elsewhere: Secondary | ICD-10-CM | POA: Diagnosis present

## 2023-03-24 LAB — COMPREHENSIVE METABOLIC PANEL
ALT: 11 U/L (ref 0–44)
AST: 33 U/L (ref 15–41)
Albumin: 3.7 g/dL (ref 3.5–5.0)
Alkaline Phosphatase: 58 U/L (ref 38–126)
Anion gap: 8 (ref 5–15)
BUN: 26 mg/dL — ABNORMAL HIGH (ref 8–23)
CO2: 24 mmol/L (ref 22–32)
Calcium: 9.2 mg/dL (ref 8.9–10.3)
Chloride: 105 mmol/L (ref 98–111)
Creatinine, Ser: 1.05 mg/dL — ABNORMAL HIGH (ref 0.44–1.00)
GFR, Estimated: 50 mL/min — ABNORMAL LOW (ref >60)
Glucose, Bld: 100 mg/dL — ABNORMAL HIGH (ref 70–99)
Potassium: 4.2 mmol/L (ref 3.5–5.1)
Sodium: 137 mmol/L (ref 135–145)
Total Bilirubin: 1 mg/dL (ref 0.3–1.2)
Total Protein: 7.3 g/dL (ref 6.5–8.1)

## 2023-03-24 LAB — URINALYSIS, ROUTINE W REFLEX MICROSCOPIC
Bilirubin Urine: NEGATIVE
Glucose, UA: NEGATIVE mg/dL
Hgb urine dipstick: NEGATIVE
Ketones, ur: NEGATIVE mg/dL
Nitrite: POSITIVE — AB
Protein, ur: NEGATIVE mg/dL
Specific Gravity, Urine: 1.005 (ref 1.005–1.030)
pH: 6 (ref 5.0–8.0)

## 2023-03-24 LAB — CBC
HCT: 36.5 % (ref 36.0–46.0)
Hemoglobin: 11.9 g/dL — ABNORMAL LOW (ref 12.0–15.0)
MCH: 31.1 pg (ref 26.0–34.0)
MCHC: 32.6 g/dL (ref 30.0–36.0)
MCV: 95.3 fL (ref 80.0–100.0)
Platelets: 218 10*3/uL (ref 150–400)
RBC: 3.83 MIL/uL — ABNORMAL LOW (ref 3.87–5.11)
RDW: 13.1 % (ref 11.5–15.5)
WBC: 6.7 10*3/uL (ref 4.0–10.5)
nRBC: 0 % (ref 0.0–0.2)

## 2023-03-24 LAB — TROPONIN I (HIGH SENSITIVITY)
Troponin I (High Sensitivity): 18 ng/L — ABNORMAL HIGH (ref <18)
Troponin I (High Sensitivity): 17 ng/L (ref <18)

## 2023-03-24 LAB — CK: Total CK: 164 U/L (ref 38–234)

## 2023-03-24 MED ORDER — SODIUM CHLORIDE 0.9 % IV SOLN: INTRAVENOUS | Status: DC

## 2023-03-24 MED ORDER — SODIUM CHLORIDE 0.9 % IV BOLUS
1000.0000 mL | Freq: Once | INTRAVENOUS | Status: AC
  Administered 2023-03-24: 1000 mL via INTRAVENOUS

## 2023-03-24 MED ORDER — SODIUM CHLORIDE 0.9 % IV SOLN
1.0000 g | INTRAVENOUS | Status: DC
  Administered 2023-03-24 – 2023-03-25 (×2): 1 g via INTRAVENOUS
  Filled 2023-03-24: qty 1
  Filled 2023-03-24: qty 10
  Filled 2023-03-24: qty 1

## 2023-03-24 MED ORDER — ENOXAPARIN SODIUM 40 MG/0.4ML IJ SOSY
40.0000 mg | PREFILLED_SYRINGE | INTRAMUSCULAR | Status: DC
  Administered 2023-03-24 – 2023-03-28 (×5): 40 mg via SUBCUTANEOUS
  Filled 2023-03-24 (×5): qty 0.4

## 2023-03-24 NOTE — Assessment & Plan Note (Signed)
Acute decompensation of baseline weakness in the setting of UTI Continue home medications pending SLP evaluation Fall precautions Follow

## 2023-03-24 NOTE — Assessment & Plan Note (Signed)
On aspirin and statin Restart pending SLP evaluation

## 2023-03-24 NOTE — Assessment & Plan Note (Signed)
Per the daughter, patient with increasing coughing/choking episodes associated with food and liquids SLP evaluation N.p.o. in the interim Follow

## 2023-03-24 NOTE — Assessment & Plan Note (Signed)
Positive dysuria, increased urinary frequency with recurrent falls in the setting of baseline Parkinson's disease Urinalysis indicative of infection IV Rocephin Urine culture Follow

## 2023-03-24 NOTE — Evaluation (Signed)
Physical Therapy Evaluation Patient Details Name: Kimberly Scott MRN: [id number] DOB: February 03, 1928 Today's Date: 03/24/2023  History of Present Illness  89 y/o female presented to ED on 03/24/23 for fall, UTI, weakness, and dysphagia. Admitted for UTI. PMH: Parkinson's disease, TIA, CAD, HTN  Clinical Impression  Patient admitted with the above. PTA, patient lives alone and modI for mobility with RW but requires assistance for iADLs from daughter. Patient demonstrates weakness, impaired balance, decreased activity tolerance, and impaired cognition. Demos general confusion throughout session and very talkative. Requires min-modA+2 for transfers with RW and frequent cueing and assist for RW management and hand placement. Daughter reports multiple falls over past 6 months. Patient will benefit from skilled PT services during acute stay to address listed deficits.        Recommendations for follow up therapy are one component of a multi-disciplinary discharge planning process, led by the attending physician.  Recommendations may be updated based on patient status, additional functional criteria and insurance authorization.  Follow Up Recommendations Can patient physically be transported by private vehicle: Yes     Assistance Recommended at Discharge Frequent or constant Supervision/Assistance  Patient can return home with the following  A lot of help with walking and/or transfers;A lot of help with bathing/dressing/bathroom;Assistance with cooking/housework;Direct supervision/assist for medications management;Direct supervision/assist for financial management;Assist for transportation;Help with stairs or ramp for entrance    Equipment Recommendations None recommended by PT  Recommendations for Other Services       Functional Status Assessment Patient has had a recent decline in their functional status and demonstrates the ability to make significant improvements in function in a reasonable and  predictable amount of time.     Precautions / Restrictions Precautions Precautions: Fall Precaution Comments: hx of Parkinson's Restrictions Weight Bearing Restrictions: No      Mobility  Bed Mobility Overal bed mobility: Needs Assistance Bed Mobility: Supine to Sit, Sit to Supine     Supine to sit: Max assist Sit to supine: Max assist   General bed mobility comments: assistance for trunk and B LEs    Transfers Overall transfer level: Needs assistance Equipment used: Rolling [place] Upham (2 wheels), 2 person hand held assist Transfers: Sit to/from Stand, Bed to chair/wheelchair/BSC Sit to Stand: Mod assist, +2 physical assistance, +2 safety/equipment   Step pivot transfers: Min assist, +2 physical assistance, +2 safety/equipment            Ambulation/Gait                  Stairs            Wheelchair Mobility    Modified Rankin (Stroke Patients Only)       Balance Overall balance assessment: Needs assistance Sitting-balance support: Feet supported, Bilateral upper extremity supported Sitting balance-Leahy Scale: Fair   Postural control: Posterior lean Standing balance support: Reliant on assistive device for balance, During functional activity, Bilateral upper extremity supported Standing balance-Leahy Scale: Poor                               Pertinent Vitals/Pain Pain Assessment Pain Assessment: No/denies pain    Home Living Family/patient expects to be discharged to:: Skilled nursing facility Living Arrangements: Alone Available Help at Discharge: Family;Available PRN/intermittently Type of Home: House Home Access: Stairs to enter   [employer] of Steps: 2 STE with no rail and then 1 additional step into home with rail   Home Layout: One  level Home Equipment: Agricultural consultant (2 wheels);Rollator (4 wheels);Cane - quad;Cane - single point;BSC/3in1      Prior Function Prior Level of Function : Needs assist;History  of Falls (last six months)             Mobility Comments: Pt uses RW for mobility in the home with daughter reporting 5 falls in the last 6 months. ADLs Comments: Pt does ADLs at mod I level. Her daughter assist with all IADLs, takes her to her home 1x wk for shower.     Hand Dominance   Dominant Hand: Right    Extremity/Trunk Assessment   Upper Extremity Assessment Upper Extremity Assessment: Defer to OT evaluation    Lower Extremity Assessment Lower Extremity Assessment: Generalized weakness    Cervical / Trunk Assessment Cervical / Trunk Assessment: Kyphotic  Communication   Communication: HOH  Cognition Arousal/Alertness: Awake/alert Behavior During Therapy: WFL for tasks assessed/performed Overall Cognitive Status: Impaired/Different from baseline Area of Impairment: Orientation, Attention, Memory, Following commands, Safety/judgement, Awareness                 Orientation Level: Disoriented to, Time Current Attention Level: Focused Memory: Decreased short-term memory Following Commands: Follows one step commands consistently Safety/Judgement: Decreased awareness of safety, Decreased awareness of deficits Awareness: Intellectual   General Comments: Pt very talkative during session with  general confusion and daughter having to correct her throughout.        General Comments      Exercises     Assessment/Plan    PT Assessment Patient needs continued PT services  PT Problem List Decreased strength;Decreased activity tolerance;Decreased balance;Decreased mobility;Decreased safety awareness;Decreased knowledge of precautions;Decreased cognition;Decreased knowledge of use of DME       PT Treatment Interventions DME instruction;Gait training;Functional mobility training;Therapeutic activities;Therapeutic exercise;Balance training;Neuromuscular re-education;Patient/family education    PT Goals (Current goals can be found in the Care Plan section)   Acute Rehab PT Goals Patient Stated Goal: to get out of here PT Goal Formulation: With patient/family Time For Goal Achievement: 04/07/23 Potential to Achieve Goals: Fair    Frequency Min 3X/week     Co-evaluation PT/OT/SLP Co-Evaluation/Treatment: Yes Reason for Co-Treatment: Necessary to address cognition/behavior during functional activity;For patient/therapist safety;To address functional/ADL transfers PT goals addressed during session: Mobility/safety with mobility OT goals addressed during session: ADL's and self-care       AM-PAC PT "6 Clicks" Mobility  Outcome Measure Help needed turning from your back to your side while in a flat bed without using bedrails?: A Lot Help needed moving from lying on your back to sitting on the side of a flat bed without using bedrails?: A Lot Help needed moving to and from a bed to a chair (including a wheelchair)?: Total Help needed standing up from a chair using your arms (e.g., wheelchair or bedside chair)?: Total Help needed to walk in hospital room?: Total Help needed climbing 3-5 steps with a railing? : Total 6 Click Score: 8    End of Session   Activity Tolerance: Patient tolerated treatment well Patient left: in bed;with bed alarm set;with call bell/phone within reach;with family/visitor present Nurse Communication: Mobility status PT Visit Diagnosis: Muscle weakness (generalized) (M62.81);History of falling (Z91.81);Unsteadiness on feet (R26.81);Repeated falls (R29.6);Difficulty in walking, not elsewhere classified (R26.2)    Time: 1413-1440 PT Time Calculation (min) (ACUTE ONLY): 27 min   Charges:   PT Evaluation $PT Eval Moderate Complexity: 1 Mod          Maylon Peppers, PT, DPT Physical  Therapist - [place]  [facility]   Clance Baquero A Jeanmarie Mccowen 03/24/2023, 3:39 PM

## 2023-03-24 NOTE — H&P (Signed)
History and Physical    Patient: Kimberly Scott:[id number] DOB: 1928-02-24 DOA: 03/24/2023 DOS: the patient was seen and examined on 03/24/2023 PCP: Lynnea Ferrier, MD  Patient coming from: Home  Chief Complaint:  Chief Complaint  Patient presents with   Fall   HPI: Kimberly Scott is a 89 y.o. female with medical history significant of Parkinson disease, dementia, hypertension, coronary artery disease presenting with fall, UTI, weakness, dysphagia.  History primarily from patient's daughter in the setting of dementia.  Per report, patient with worsening falls over the past 2 to 3 days.  Also with worsening weakness.  No fevers or chills.  No nausea or vomiting though patient is having increasing episodes of choking and coughing associated with eating solid and liquids.  Per the daughter, patient has similar episode and symptom profile associated with UTI in the past.  No recent medication changes.  Noted worsening confusion. Presented to ER afebrile, hemodynamically stable.  Satting well on room air.  White count 6.7, hemoglobin 12, platelets 218, troponin 17, CK1 64, urinalysis positive for nitrites and large leukocytes, creatinine 1.05.  Pelvis plain films grossly stable. Review of Systems: As mentioned in the history of present illness. All other systems reviewed and are negative. Past Medical History:  Diagnosis Date   Anginal pain (HCC)    Arthritis    Cancer (HCC)    FACIAL SKIN CANCER   Coronary artery disease    Headache(784.0)    Heart murmur    Hypertension    Pneumonia    hx of PNA   Past Surgical History:  Procedure Laterality Date   ABDOMINAL HYSTERECTOMY     APPENDECTOMY     cataracts     CORONARY ARTERY BYPASS GRAFT  10/01/2012   Procedure: CORONARY ARTERY BYPASS GRAFTING (CABG);  Surgeon: Delight Ovens, MD;  Location: [facility] OR;  Service: Open Heart Surgery;  Laterality: N/A;  times five using Left Internal Mammary Artery    ENDOVEIN HARVEST OF  GREATER SAPHENOUS VEIN  10/01/2012   Procedure: ENDOVEIN HARVEST OF GREATER SAPHENOUS VEIN;  Surgeon: Delight Ovens, MD;  Location: [facility] OR;  Service: Open Heart Surgery;  Laterality: Bilateral;   ESOPHAGOGASTRODUODENOSCOPY N/A 06/30/2021   Procedure: ESOPHAGOGASTRODUODENOSCOPY (EGD);  Surgeon: Toledo, Boykin Nearing, MD;  Location: [facility] ENDOSCOPY;  Service: Gastroenterology;  Laterality: N/A;   KNEE SURGERY     TONSILLECTOMY     TOTAL KNEE ARTHROPLASTY     right knee   Social History:  reports that she has never smoked. She has never used smokeless tobacco. She reports that she does not drink alcohol and does not use drugs.  Allergies  Allergen Reactions   Bacitracin-Neomycin-Polymyxin Swelling and Rash    Blisters Blisters    Ace Inhibitors     Other reaction(s): Cough   Omega 3  [Fish Oil] Other (See Comments)   Neosporin [Neomycin-Bacitracin Zn-Polymyx] Rash   Tape Rash    Blisters    History reviewed. No pertinent family history.  Prior to Admission medications   Medication Sig Start Date End Date Taking? Authorizing Provider  acetaminophen (TYLENOL) 325 MG tablet Take 2 tablets (650 mg total) by mouth every 6 (six) hours as needed for mild pain, headache or fever. 12/11/21   Pennie Banter, DO  ascorbic acid (VITAMIN C) 500 MG tablet Take 2 tablets (1,000 mg total) by mouth daily. 12/11/21   Pennie Banter, DO  aspirin EC 81 MG tablet Take 81 mg by mouth daily.  [provider]  atorvastatin (LIPITOR) 40 MG tablet Take 1 tablet (40 mg total) by mouth daily at 6 PM. 05/23/18   Renae Gloss, Richard, MD  carbidopa-levodopa (SINEMET IR) 25-100 MG tablet Take 2 tablets by mouth as directed. Take 2 tabs in the morning and 1 tablet at noon and dinner time    [provider]  cholecalciferol (VITAMIN D) 1000 units tablet Take 1,000 Units by mouth daily.    [provider]  cyanocobalamin (,VITAMIN B-12,) 1000 MCG/ML injection Inject 1,000 mcg into the muscle  every 30 (thirty) days. Last dose 11/20/21 11/21/21   [provider]  diclofenac Sodium (VOLTAREN) 1 % GEL Apply 2 g topically 4 (four) times daily. 12/11/21   Pennie Banter, DO  feeding supplement (ENSURE ENLIVE / ENSURE PLUS) LIQD Take 237 mLs by mouth 3 (three) times daily between meals. 12/11/21   Pennie Banter, DO  gabapentin (NEURONTIN) 100 MG capsule Take 100 mg by mouth at bedtime.    [provider]  Multiple Vitamin (MULTIVITAMIN WITH MINERALS) TABS tablet Take 1 tablet by mouth daily. 12/12/21   Esaw Grandchild A, DO  phenol (CHLORASEPTIC) 1.4 % LIQD Use as directed 1 spray in the mouth or throat as needed for throat irritation / pain. 12/11/21   Esaw Grandchild A, DO  telmisartan (MICARDIS) 20 MG tablet Take 20 mg by mouth daily.    [provider]  triamcinolone ointment (KENALOG) 0.1 % Apply 1 application topically 2 (two) times daily as needed.    [provider]    Physical Exam: Vitals:   03/24/23 0930 03/24/23 1000 03/24/23 1130 03/24/23 1217  BP: (!) 170/76 (!) 149/78 (!) 148/68   Pulse: 74 73 70   Resp:  18 18   Temp:    (!) 97.5 F (36.4 C)  TempSrc:    Oral  SpO2: 100% 96% 99%   Weight:    70.8 kg  Height:    5' (1.524 m)   Physical Exam Constitutional:      Comments: Underweight  HENT:     Head: Normocephalic and atraumatic.     Mouth/Throat:     Mouth: Mucous membranes are moist.  Eyes:     Pupils: Pupils are equal, round, and reactive to light.  Cardiovascular:     Rate and Rhythm: Normal rate and regular rhythm.  Pulmonary:     Effort: Pulmonary effort is normal.  Abdominal:     General: Bowel sounds are normal.  Musculoskeletal:        General: Normal range of motion.     Cervical back: Normal range of motion.  Skin:    General: Skin is warm.  Neurological:     General: No focal deficit present.  Psychiatric:        Mood and Affect: Mood normal.     Data Reviewed:  There are no new results to review  at this time. DG Pelvis Portable CLINICAL DATA:  Coccygeal pain after a fall  EXAM: PORTABLE PELVIS 1-2 VIEWS  COMPARISON:  None Available.  FINDINGS: There is no acute fracture or dislocation in the pelvis; however, note that the sacrum and coccyx are not well assessed. Femoroacetabular alignment is normal. The joint spaces are preserved. The SI joints and symphysis pubis are intact. The soft tissues are unremarkable.  IMPRESSION: No evidence of acute fracture or dislocation in the pelvis; however, note that the sacrum and coccyx are not well assessed.  Electronically Signed   By: Theron Arista  Noone M.D.   On: 03/24/2023 08:21  Lab Results  Component Value Date   WBC 6.7 03/24/2023   HGB 11.9 (L) 03/24/2023   HCT 36.5 03/24/2023   MCV 95.3 03/24/2023   PLT 218 03/24/2023   Last metabolic panel Lab Results  Component Value Date   GLUCOSE 100 (H) 03/24/2023   NA 137 03/24/2023   K 4.2 03/24/2023   CL 105 03/24/2023   CO2 24 03/24/2023   BUN 26 (H) 03/24/2023   CREATININE 1.05 (H) 03/24/2023   GFRNONAA 50 (L) 03/24/2023   CALCIUM 9.2 03/24/2023   PROT 7.3 03/24/2023   ALBUMIN 3.7 03/24/2023   BILITOT 1.0 03/24/2023   ALKPHOS 58 03/24/2023   AST 33 03/24/2023   ALT 11 03/24/2023   ANIONGAP 8 03/24/2023    Assessment and Plan: * UTI (urinary tract infection) Positive dysuria, increased urinary frequency with recurrent falls in the setting of baseline Parkinson's disease Urinalysis indicative of infection IV Rocephin Urine culture Follow  Falls Worsening falls and weakness at home in the setting of UTI -baseline Parkinson's disease and dementia CT head pending Treat UTI PT OT evaluation Fall precautions Follow  Dysphagia Per the daughter, patient with increasing coughing/choking episodes associated with food and liquids SLP evaluation N.p.o. in the interim Follow  Parkinson's disease Acute decompensation of baseline weakness in the setting of  UTI Continue home medications pending SLP evaluation Fall precautions Follow  TIA (transient ischemic attack) On aspirin and statin Restart pending SLP evaluation      Advance Care Planning:   Code Status: Full Code   Consults: None   Family Communication: Daughter at the bedside   Severity of Illness: The appropriate patient status for this patient is INPATIENT. Inpatient status is judged to be reasonable and necessary in order to provide the required intensity of service to ensure the patient's safety. The patient's presenting symptoms, physical exam findings, and initial radiographic and laboratory data in the context of their chronic comorbidities is felt to place them at high risk for further clinical deterioration. Furthermore, it is not anticipated that the patient will be medically stable for discharge from the hospital within 2 midnights of admission.   * I certify that at the point of admission it is my clinical judgment that the patient will require inpatient hospital care spanning beyond 2 midnights from the point of admission due to high intensity of service, high risk for further deterioration and high frequency of surveillance required.*  Author: Floydene Flock, MD 03/24/2023 12:34 PM  For on call review [email or web address].

## 2023-03-24 NOTE — ED Notes (Signed)
Pt assisted to bed by this RN and Sydell Axon, Charity fundraiser. Pt placed on monitor. Primary RN made aware blood was not drawn in triage.

## 2023-03-24 NOTE — Assessment & Plan Note (Signed)
Worsening falls and weakness at home in the setting of UTI -baseline Parkinson's disease and dementia CT head pending Treat UTI PT OT evaluation Fall precautions Follow

## 2023-03-24 NOTE — Evaluation (Signed)
Clinical/Bedside Swallow Evaluation Patient Details  Name: Kimberly Scott MRN: [id number] Date of Birth: 06-10-1928  Today's Date: 03/24/2023 Time: SLP Start Time (ACUTE ONLY): 1600 SLP Stop Time (ACUTE ONLY): 1700 SLP Time Calculation (min) (ACUTE ONLY): 60 min  Past Medical History:  Past Medical History:  Diagnosis Date   Anginal pain (HCC)    Arthritis    Cancer (HCC)    FACIAL SKIN CANCER   Coronary artery disease    Headache(784.0)    Heart murmur    Hypertension    Pneumonia    hx of PNA   Past Surgical History:  Past Surgical History:  Procedure Laterality Date   ABDOMINAL HYSTERECTOMY     APPENDECTOMY     cataracts     CORONARY ARTERY BYPASS GRAFT  10/01/2012   Procedure: CORONARY ARTERY BYPASS GRAFTING (CABG);  Surgeon: Delight Ovens, MD;  Location: [facility] OR;  Service: Open Heart Surgery;  Laterality: N/A;  times five using Left Internal Mammary Artery    ENDOVEIN HARVEST OF GREATER SAPHENOUS VEIN  10/01/2012   Procedure: ENDOVEIN HARVEST OF GREATER SAPHENOUS VEIN;  Surgeon: Delight Ovens, MD;  Location: [facility] OR;  Service: Open Heart Surgery;  Laterality: Bilateral;   ESOPHAGOGASTRODUODENOSCOPY N/A 06/30/2021   Procedure: ESOPHAGOGASTRODUODENOSCOPY (EGD);  Surgeon: Toledo, Boykin Nearing, MD;  Location: [facility] ENDOSCOPY;  Service: Gastroenterology;  Laterality: N/A;   KNEE SURGERY     TONSILLECTOMY     TOTAL KNEE ARTHROPLASTY     right knee   HPI:  Pt is a 89 y.o. female with medical history significant of Parkinson disease, Dementia, hypertension, coronary artery disease presenting with fall, UTIs, weakness, and report of Esopahgeal phase dysmotility -- coughing up food, per Daughter/pt.  Per MD, history primarily from patient's Daughter in the setting of Dementia.  Per report, "patient with worsening falls over the past 2 to 3 days.  Also with worsening weakness.  No fevers or chills.  No nausea or vomiting.".   Per Daughter's description to this SLP, pt has had  episodes of "choking" and "coughing back up the bread" when eating a biscuit.  Per the Daughter, patient has episodes of hallucinations, confusion, and falls with UTIs in the past.   Head CT: "No acute intracranial hemorrhage.  2. Unchanged severe chronic small-vessel disease.".  Pt has had No recent chest imaging; last Imaging was in 11/2021 which was a negative CXR.   OF NOTE: EGD in 06/2021 revealed: Food found in the distal Esophagus; Esophagitis.     Assessment / Plan / Recommendation  Clinical Impression   Pt seen for BSE today. Pt rested w/ eyes closed often(baseline per Dtr); pt verbal and followed through w/ basic tasks w/ frequent cues at times. She answered basic questions re: self; mild+ confusion in responses. Pt has Baseline dx of Dementia and Parkinson's Dis; mild UE tremors. Daughter present in room. Daughter reported pt lives Alone at home; they go by daily to check on her.  On RA; afebrile. WBC WNL.  Pt appears to present w/ functional oropharyngeal phase swallowing w/ No oropharyngeal phase dysphagia noted, No neuromuscular deficits noted. Pt consumed po trials w/ No overt, clinical s/s of aspiration during po trials. Pt appears at reduced risk for aspiration from an oropharyngeal phase standpoint when following general aspiration precautions.   However, pt does have challenging factors that could impact her oropharyngeal swallowing to include Baseline Esophageal phase Dysmotility -- per pt/Daughter's report, pt has "coughed back up" food. Pt endorses REFLUX behavior.  EDG in 2022 revealed food in Distal Esophagus and Esophagitis then. ANY Dysmotility or Regurgitation of Reflux material can increase risk for aspiration of that Reflux material during Retrograde flow thus impact Pulmonary status.  Pt also has Baseline dxs of Dementia and Parkinson's Disease. ANY Cognitive decline can impact overall awareness/safety during self-feeding and timing of swallow during po tasks which increases  risk for aspiration, choking.  Pt is also of advanced age and the UE tremors can impact self-feeding abilities per the Daughter.  These factors can increase risk for aspiration, dysphagia as well as decreased oral intake overall.   During po trials, pt consumed all food/liquid consistencies w/ No overt coughing, decline in vocal quality, or change in respiratory presentation during/post trials. Oral phase appeared [facility] w/ timely bolus management, mastication, and control of bolus propulsion for A-P transfer for swallowing. Oral clearing achieved w/ all trial consistencies -- moistened, soft foods given. Pt tended to take larger bites and intermittently talked when feeding self -- education given on strategies of smaller bites and no talking w/ food in mouth. Unsure of follow through w/ new information in setting of Baseline Cognitive decline, illness. Pt would benefit from Supervision during meals for follow through w/ general aspiration precautions for increased safety.  OM Exam appeared [facility] w/ no unilateral weakness noted. Speech Clear. Pt fed self w/ setup support.   Recommend a fairly Regular consistency diet w/ well-Cut meats, moistened foods; Thin liquids via CUP to better monitor bolus size during drinking. Recommend general aspiration precautions, food prep for ease of mastication and lessening bulky/dry foods that could increase Esophageal phase Dysmotility. Reduce distractions during meals. Supervision during meals for support w/ follow through w/ precautions.  Pills WHOLE in Puree for safer, easier swallowing. These recommendations were discussed w/ pt/Daughter.   Education given on impact of Cognitive decline and Esophageal phase Dysmotility on swallowing; Pills in Puree; food consistencies and easy to eat options; general aspiration and Esophageal phase motility support/precautions to pt and Daughter. No further skilled ST services indicated as pt appears at her Baseline. MD/NSG to reconsult if  any new needs arise. MD/NSG updated, agreed. Recommend GI f/u for formal assessment and management if pt continues to have difficulty w/ solids/Regurgitation episodes. Daughter agreed. SLP Visit Diagnosis: Dysphagia, unspecified (R13.10) (Baseline Esophageal phase Dysmotility; Baseline Dementia)    Aspiration Risk   (reduced following general precautions)    Diet Recommendation   a fairly Regular consistency diet w/ well-Cut meats, moistened foods; Thin liquids via CUP to better monitor bolus size during drinking. Recommend general aspiration precautions, food prep for ease of mastication and lessening bulky/dry foods that could increase Esophageal phase Dysmotility. Reduce distractions during meals. Supervision during meals for support w/ follow through w/ precautions.   Medication Administration: Whole meds with puree (for safer swallowing)    Other  Recommendations Recommended Consults: Consider GI evaluation;Consider esophageal assessment (if desired) Oral Care Recommendations: Oral care BID;Oral care before and after PO;Patient independent with oral care (setup support)    Recommendations for follow up therapy are one component of a multi-disciplinary discharge planning process, led by the attending physician.  Recommendations may be updated based on patient status, additional functional criteria and insurance authorization.  Follow up Recommendations No SLP follow up      Assistance Recommended at Discharge  Intermittent; at meals  Functional Status Assessment  (no decline from functional swallowing)  Frequency and Duration  (n/a)   (n/a)       Prognosis Prognosis for  improved oropharyngeal function: Good Barriers to Reach Goals: Cognitive deficits;Time post onset;Severity of deficits;Behavior Barriers/Prognosis Comment: Esophageal phase Dysmotility baseline      Swallow Study   General Date of Onset: 03/24/23 HPI: Pt is a 89 y.o. female with medical history significant of  Parkinson disease, Dementia, hypertension, coronary artery disease presenting with fall, UTIs, weakness, and report of Esopahgeal phase dysmotility -- coughing up food, per Daughter/pt.  Per MD, history primarily from patient's Daughter in the setting of Dementia.  Per report, "patient with worsening falls over the past 2 to 3 days.  Also with worsening weakness.  No fevers or chills.  No nausea or vomiting.".  Per Daughter's description to this SLP, pt has had episodes of "choking" and "coughing back up the bread" when eating a biscuit.  Per the Daughter, patient has episodes of hallucinations, confusion, and falls with UTIs in the past.  Head CT: "No acute intracranial hemorrhage.  2. Unchanged severe chronic small-vessel disease.".  Pt has had No recent chest imaging; last Imaging was in 11/2021 which was a negative CXR.  OF NOTE: EGD in 06/2021 revealed: Food found in the distal Esophagus; Esophagitis. Type of Study: Bedside Swallow Evaluation Previous Swallow Assessment: none; EGD in 06/2021 Diet Prior to this Study: NPO (regular diet at home) Temperature Spikes Noted: No (wbc 6.7) Respiratory Status: Room air History of Recent Intubation: No Behavior/Cognition: Alert;Cooperative;Pleasant mood;Confused;Distractible;Requires cueing (Dementia) Oral Cavity Assessment: Within Functional Limits Oral Care Completed by SLP: Yes Oral Cavity - Dentition: Adequate natural dentition Vision: Functional for self-feeding Self-Feeding Abilities: Able to feed self;Needs assist;Needs set up Patient Positioning: Upright in bed (needed positioning support) Baseline Vocal Quality: Normal Volitional Cough: Strong Volitional Swallow: Able to elicit    Oral/Motor/Sensory Function Overall Oral Motor/Sensory Function: Within functional limits   Ice Chips Ice chips: Within functional limits Presentation: Spoon (fed; 2 trials)   Thin Liquid Thin Liquid: Within functional limits Presentation: Cup;Self Fed (~4-5 ozs  total)    Nectar Thick Nectar Thick Liquid: Not tested   Honey Thick Honey Thick Liquid: Not tested   Puree Puree: Within functional limits Presentation: Spoon;Self Fed (4 ozs)   Solid     Solid: Within functional limits Presentation: Self Fed (8 trials)          Kimberly Som, MS, CCC-SLP Speech Language Pathologist Rehab Services; [facility] [phone] (ascom) Kimberly Scott 03/24/2023,6:01 PM

## 2023-03-24 NOTE — ED Triage Notes (Signed)
Pt to Ed via POV from home. Pt's family reports strong urine odor and pt has been seeing people in her house and sparkles. Pt fell yesterday and this morning. Pt unsure how long she was on the floor.

## 2023-03-24 NOTE — Evaluation (Signed)
Occupational Therapy Evaluation Patient Details Name: Kimberly Scott MRN: [id number] DOB: Jun 02, 1928 Today's Date: 03/24/2023   History of Present Illness Kimberly Scott is a 89 y.o. female with medical history significant of Parkinson disease, dementia, hypertension, coronary artery disease presenting with fall, UTI, weakness, dysphagia.  History primarily from patient's daughter in the setting of dementia.  Per report, patient with worsening falls over the past 2 to 3 days.  Also with worsening weakness.  No fevers or chills.  No nausea or vomiting though patient is having increasing episodes of choking and coughing associated with eating solid and liquids.  Per the daughter, patient has similar episode and symptom profile associated with UTI in the past.   Clinical Impression   Patient presenting with decreased Ind in self care,balance, functional mobility/transfer, endurance, and safety awareness. Patient's daughter present to confirm baseline. Pt lives at home alone and is mod I for self care and mobility tasks with use of RW. Her daughter performs all IADLs.  Patient currently functioning at min - mod A of 2 for bed mobility and toileting needs. Pt continues to be confused and daughter reports recent hallucinations as well. Multiple falls over the last 6 months. Patient will benefit from acute OT to increase overall independence in the areas of ADLs, functional mobility, and safety awareness in order to safely discharge.     Recommendations for follow up therapy are one component of a multi-disciplinary discharge planning process, led by the attending physician.  Recommendations may be updated based on patient status, additional functional criteria and insurance authorization.   Assistance Recommended at Discharge Frequent or constant Supervision/Assistance  Patient can return home with the following A lot of help with walking and/or transfers;A lot of help with  bathing/dressing/bathroom;Assistance with cooking/housework;Assist for transportation;Help with stairs or ramp for entrance;Direct supervision/assist for financial management;Direct supervision/assist for medications management    Functional Status Assessment  Patient has had a recent decline in their functional status and demonstrates the ability to make significant improvements in function in a reasonable and predictable amount of time.  Equipment Recommendations  Other (comment) (defer)       Precautions / Restrictions Precautions Precautions: Fall      Mobility Bed Mobility Overal bed mobility: Needs Assistance Bed Mobility: Supine to Sit, Sit to Supine     Supine to sit: Max assist Sit to supine: Max assist   General bed mobility comments: assistance for trunk and B LEs    Transfers Overall transfer level: Needs assistance Equipment used: Rolling walker (2 wheels), 2 person hand held assist Transfers: Sit to/from Stand, Bed to chair/wheelchair/BSC Sit to Stand: Mod assist, +2 physical assistance, +2 safety/equipment     Step pivot transfers: Min assist, +2 physical assistance, +2 safety/equipment            Balance Overall balance assessment: Needs assistance Sitting-balance support: Feet supported, Bilateral upper extremity supported Sitting balance-Leahy Scale: Fair   Postural control: Posterior lean Standing balance support: Reliant on assistive device for balance, During functional activity, Bilateral upper extremity supported Standing balance-Leahy Scale: Poor                             ADL either performed or assessed with clinical judgement   ADL Overall ADL's : Needs assistance/impaired                     Lower Body Dressing: Maximal assistance;Sit to/from stand  Toilet Transfer: BSC/3in1;Rolling walker (2 wheels);+2 for physical assistance;+2 for safety/equipment;Moderate assistance   Toileting- Clothing Manipulation and  Hygiene: Maximal assistance Toileting - Clothing Manipulation Details (indicate cue type and reason): assistance for clothing management and hygiene     Functional mobility during ADLs: Rolling walker (2 wheels);Minimal assistance;+2 for physical assistance;+2 for safety/equipment       Vision Patient Visual Report: No change from baseline              Pertinent Vitals/Pain Pain Assessment Pain Assessment: No/denies pain     Hand Dominance Right   Extremity/Trunk Assessment Upper Extremity Assessment Upper Extremity Assessment: Generalized weakness   Lower Extremity Assessment Lower Extremity Assessment: Generalized weakness       Communication Communication Communication: HOH   Cognition Arousal/Alertness: Awake/alert Behavior During Therapy: WFL for tasks assessed/performed Overall Cognitive Status: Impaired/Different from baseline Area of Impairment: Orientation, Attention, Memory, Following commands, Safety/judgement, Awareness                 Orientation Level: Disoriented to, Time Current Attention Level: Focused Memory: Decreased short-term memory Following Commands: Follows one step commands consistently Safety/Judgement: Decreased awareness of safety, Decreased awareness of deficits Awareness: Intellectual   General Comments: Pt very talkative during session with  general confusion and daughter having to correct her throughout.                Home Living Family/patient expects to be discharged to:: Skilled nursing facility Living Arrangements: Alone Available Help at Discharge: Family;Available PRN/intermittently Type of Home: House Home Access: Stairs to enter [employer] of Steps: 2 STE with no rail and then 1 additional step into home with rail   Home Layout: One level     Bathroom Shower/Tub: Chief Strategy Officer: Standard Bathroom Accessibility: Yes   Home Equipment: Agricultural consultant (2 wheels);Rollator (4  wheels);Cane - quad;Cane - single point;BSC/3in1          Prior Functioning/Environment Prior Level of Function : Needs assist;History of Falls (last six months)             Mobility Comments: Pt uses RW for mobility in the home with daughter reporting 5 falls in the last 6 months. ADLs Comments: Pt does ADLs at mod I level. Her daughter assist with all IADLs, takes her to her home 1x wk for shower.        OT Problem List: Decreased strength;Decreased activity tolerance;Decreased safety awareness;Impaired balance (sitting and/or standing);Decreased knowledge of use of DME or AE;Decreased cognition      OT Treatment/Interventions: Self-care/ADL training;Therapeutic exercise;Therapeutic activities;Energy conservation;DME and/or AE instruction;Patient/family education;Balance training    OT Goals(Current goals can be found in the care plan section) Acute Rehab OT Goals Patient Stated Goal: to go to rehab and get stronger OT Goal Formulation: With patient/family Time For Goal Achievement: 04/07/23 Potential to Achieve Goals: Fair ADL Goals Pt Will Perform Grooming: with min guard assist;standing Pt Will Perform Lower Body Dressing: with min assist;sit to/from stand Pt Will Transfer to Toilet: with min assist;ambulating Pt Will Perform Toileting - Clothing Manipulation and hygiene: with min assist;sit to/from stand  OT Frequency: Min 2X/week    Co-evaluation PT/OT/SLP Co-Evaluation/Treatment: Yes Reason for Co-Treatment: Necessary to address cognition/behavior during functional activity;For patient/therapist safety;To address functional/ADL transfers PT goals addressed during session: Mobility/safety with mobility OT goals addressed during session: ADL's and self-care      AM-PAC OT "6 Clicks" Daily Activity     Outcome Measure Help from another person eating  meals?: None Help from another person taking care of personal grooming?: A Little Help from another person toileting,  which includes using toliet, bedpan, or urinal?: A Lot Help from another person bathing (including washing, rinsing, drying)?: A Lot Help from another person to put on and taking off regular upper body clothing?: A Lot Help from another person to put on and taking off regular lower body clothing?: A Lot 6 Click Score: 15   End of Session Equipment Utilized During Treatment: Rolling walker (2 wheels) Nurse Communication: Mobility status  Activity Tolerance: Patient tolerated treatment well Patient left: in bed;with call bell/phone within reach;with bed alarm set;with family/visitor present  OT Visit Diagnosis: Unsteadiness on feet (R26.81);Repeated falls (R29.6);Muscle weakness (generalized) (M62.81);History of falling (Z91.81)                Time: 1413-1440 OT Time Calculation (min): 27 min Charges:  OT General Charges $OT Visit: 1 Visit OT Evaluation $OT Eval Moderate Complexity: 1 [address] [place], OTR/L , CBIS ascom [phone]  03/24/23, 3:06 PM

## 2023-03-24 NOTE — ED Provider Notes (Signed)
[facility] Provider Note   Event Date/Time   First MD Initiated Contact with Patient 03/24/23 [phone]     (approximate) History  Fall  HPI Kimberly Scott is a 89 y.o. female with a past medical history of Parkinson's disease, hypertension, and recurrent urinary tract infections who presents via private vehicle from home with family reporting patient has been hallucinating seeing other people in the house as well as sparkles on the floor.  Family also states that patient has had multiple falls over the past 2 days including last night when she was found down for an unknown period of time this morning.  Patient is unsure as to how she was on the floor, how long she was on the floor, or why she did not call anyone for help.  Patient only complaining of coccygeal pain at this time. ROS: Patient currently denies any vision changes, tinnitus, difficulty speaking, facial droop, sore throat, chest pain, shortness of breath, abdominal pain, nausea/vomiting/diarrhea, dysuria, or weakness/numbness/paresthesias in any extremity   Physical Exam  Triage Vital Signs: ED Triage Vitals  Enc Vitals Group     BP 03/24/23 0746 (!) 186/86     Pulse Rate 03/24/23 0746 72     Resp 03/24/23 0746 20     Temp 03/24/23 0746 97.6 F (36.4 C)     Temp Source 03/24/23 0746 Oral     SpO2 03/24/23 0746 100 %     Weight --      Height --      Head Circumference --      [facility] Flow --      Pain Score 03/24/23 0745 3     Pain Loc --      Pain Edu? --      Excl. in GC? --    Most recent vital signs: Vitals:   03/24/23 1130 03/24/23 1217  BP: (!) 148/68   Pulse: 70   Resp: 18   Temp:  (!) 97.5 F (36.4 C)  SpO2: 99%    General: Awake, oriented x3.  Mildly confused to situation CV:  Good peripheral perfusion.  Resp:  Normal effort.  Abd:  No distention.  Other:  Elderly overweight Caucasian female laying in bed in no acute distress ED Results / Procedures / Treatments  Labs (all  labs ordered are listed, but only abnormal results are displayed) Labs Reviewed  CBC - Abnormal; Notable for the following components:      Result Value   RBC 3.83 (*)    Hemoglobin 11.9 (*)    All other components within normal limits  URINALYSIS, ROUTINE W REFLEX MICROSCOPIC - Abnormal; Notable for the following components:   Color, Urine YELLOW (*)    APPearance HAZY (*)    Nitrite POSITIVE (*)    Leukocytes,Ua LARGE (*)    Bacteria, UA FEW (*)    All other components within normal limits  COMPREHENSIVE METABOLIC PANEL - Abnormal; Notable for the following components:   Glucose, Bld 100 (*)    BUN 26 (*)    Creatinine, Ser 1.05 (*)    GFR, Estimated 50 (*)    All other components within normal limits  TROPONIN I (HIGH SENSITIVITY) - Abnormal; Notable for the following components:   Troponin I (High Sensitivity) 18 (*)    All other components within normal limits  CK  TROPONIN I (HIGH SENSITIVITY)   EKG ED ECG REPORT I, Merwyn Katos, the attending physician, personally viewed and interpreted this  ECG. Date: 03/24/2023 EKG Time: 0751 Rate: 74 Rhythm: normal sinus rhythm QRS Axis: normal Intervals: LAFB ST/T Wave abnormalities: normal Narrative Interpretation: NSR w/ LAFB. no evidence of acute ischemia RADIOLOGY ED MD interpretation: X-ray of the pelvis interpreted independently by me shows no evidence of acute fracture or dislocation -Agree with radiology assessment Official radiology report(s): DG Pelvis Portable  Result Date: 03/24/2023 CLINICAL DATA:  Coccygeal pain after a fall EXAM: PORTABLE PELVIS 1-2 VIEWS COMPARISON:  None Available. FINDINGS: There is no acute fracture or dislocation in the pelvis; however, note that the sacrum and coccyx are not well assessed. Femoroacetabular alignment is normal. The joint spaces are preserved. The SI joints and symphysis pubis are intact. The soft tissues are unremarkable. IMPRESSION: No evidence of acute fracture or  dislocation in the pelvis; however, note that the sacrum and coccyx are not well assessed. Electronically Signed   By: Lesia Hausen M.D.   On: 03/24/2023 08:21   PROCEDURES: Critical Care performed: No .1-3 Lead EKG Interpretation  Performed by: Merwyn Katos, MD Authorized by: Merwyn Katos, MD     Interpretation: normal     ECG rate:  71   ECG rate assessment: normal     Rhythm: sinus rhythm     Ectopy: none     Conduction: normal    MEDICATIONS ORDERED IN ED: Medications  enoxaparin (LOVENOX) injection 40 mg (has no administration in time range)  0.9 %  sodium chloride infusion (has no administration in time range)  cefTRIAXone (ROCEPHIN) 1 g in sodium chloride 0.9 % 100 mL IVPB (has no administration in time range)  sodium chloride 0.9 % bolus 1,000 mL (0 mLs Intravenous Stopped 03/24/23 1103)   IMPRESSION / MDM / ASSESSMENT AND PLAN / ED COURSE  I reviewed the triage vital signs and the nursing notes.                             The patient is on the cardiac monitor to evaluate for evidence of arrhythmia and/or significant heart rate changes. Patient's presentation is most consistent with acute presentation with potential threat to life or bodily function. Patient presents for altered mental status of unknown origin  Will obtain medical workup and discuss with social work to try to obtain collateral information.  Given History, Physical, and Workup there is no overt concern for a dangerous emergent cause such as, but not limited to, CNS infection, severe Toxidrome, severe metabolic derangement, or stroke. UA showing nitrites, leukocytes, and bacteria Patient's troponin mildly elevated 18  Given evidence of urinary tract infection with altered mental status, patient will require admission to the internal medicine service for further evaluation and management Disposition: Admit; the patient is suffering altered mental status that is persistent and therefore they will be  admitted.   FINAL CLINICAL IMPRESSION(S) / ED DIAGNOSES   Final diagnoses:  Fall, initial encounter  Urinary tract infection without hematuria, site unspecified  Altered mental status, unspecified altered mental status type   Rx / DC Orders   ED Discharge Orders     None      Note:  This document was prepared using Dragon voice recognition software and may include unintentional dictation errors.   Merwyn Katos, MD 03/24/23 [phone]

## 2023-03-25 DIAGNOSIS — N3000 Acute cystitis without hematuria: Secondary | ICD-10-CM | POA: Diagnosis not present

## 2023-03-25 LAB — COMPREHENSIVE METABOLIC PANEL
ALT: 11 U/L (ref 0–44)
AST: 27 U/L (ref 15–41)
Albumin: 3 g/dL — ABNORMAL LOW (ref 3.5–5.0)
Alkaline Phosphatase: 53 U/L (ref 38–126)
Anion gap: 6 (ref 5–15)
BUN: 18 mg/dL (ref 8–23)
CO2: 21 mmol/L — ABNORMAL LOW (ref 22–32)
Calcium: 8.3 mg/dL — ABNORMAL LOW (ref 8.9–10.3)
Chloride: 109 mmol/L (ref 98–111)
Creatinine, Ser: 0.98 mg/dL (ref 0.44–1.00)
GFR, Estimated: 54 mL/min — ABNORMAL LOW (ref >60)
Glucose, Bld: 89 mg/dL (ref 70–99)
Potassium: 3.8 mmol/L (ref 3.5–5.1)
Sodium: 136 mmol/L (ref 135–145)
Total Bilirubin: 0.9 mg/dL (ref 0.3–1.2)
Total Protein: 5.9 g/dL — ABNORMAL LOW (ref 6.5–8.1)

## 2023-03-25 LAB — CBC
HCT: 33.2 % — ABNORMAL LOW (ref 36.0–46.0)
Hemoglobin: 10.8 g/dL — ABNORMAL LOW (ref 12.0–15.0)
MCH: 31.1 pg (ref 26.0–34.0)
MCHC: 32.5 g/dL (ref 30.0–36.0)
MCV: 95.7 fL (ref 80.0–100.0)
Platelets: 201 10*3/uL (ref 150–400)
RBC: 3.47 MIL/uL — ABNORMAL LOW (ref 3.87–5.11)
RDW: 13.4 % (ref 11.5–15.5)
WBC: 6.5 10*3/uL (ref 4.0–10.5)
nRBC: 0 % (ref 0.0–0.2)

## 2023-03-25 MED ORDER — CARBIDOPA-LEVODOPA 25-100 MG PO TABS
1.0000 | ORAL_TABLET | Freq: Three times a day (TID) | ORAL | Status: DC
  Administered 2023-03-25 – 2023-03-29 (×11): 1 via ORAL
  Filled 2023-03-25 (×11): qty 1

## 2023-03-25 MED ORDER — DICLOFENAC SODIUM 1 % EX GEL
2.0000 g | Freq: Four times a day (QID) | CUTANEOUS | Status: DC
  Administered 2023-03-25 – 2023-03-29 (×12): 2 g via TOPICAL
  Filled 2023-03-25: qty 100

## 2023-03-25 MED ORDER — IRBESARTAN 150 MG PO TABS
75.0000 mg | ORAL_TABLET | Freq: Every day | ORAL | Status: DC
  Administered 2023-03-25 – 2023-03-29 (×5): 75 mg via ORAL
  Filled 2023-03-25 (×5): qty 1

## 2023-03-25 MED ORDER — ENSURE ENLIVE PO LIQD
237.0000 mL | Freq: Three times a day (TID) | ORAL | Status: DC
  Administered 2023-03-25 – 2023-03-29 (×12): 237 mL via ORAL

## 2023-03-25 MED ORDER — ASPIRIN 81 MG PO TBEC
81.0000 mg | DELAYED_RELEASE_TABLET | Freq: Every day | ORAL | Status: DC
  Administered 2023-03-26 – 2023-03-29 (×4): 81 mg via ORAL
  Filled 2023-03-25 (×4): qty 1

## 2023-03-25 MED ORDER — CARBIDOPA-LEVODOPA 25-100 MG PO TABS
1.0000 | ORAL_TABLET | Freq: Every day | ORAL | Status: DC
  Administered 2023-03-26 – 2023-03-29 (×4): 1 via ORAL
  Filled 2023-03-25 (×4): qty 1

## 2023-03-25 NOTE — Progress Notes (Signed)
  PROGRESS NOTE    Kimberly Scott  ZOX:[id number] DOB: 1928-03-21 DOA: 03/24/2023 PCP: Lynnea Ferrier, MD  [id number]  LOS: 1 day   Brief hospital course:   Assessment & Plan: Kimberly Scott is a 89 y.o. female with medical history significant of Parkinson disease, hypertension, coronary artery disease presenting with fall, UTI, weakness, dysphagia.  History primarily from patient's daughter in the setting of dementia.  Per report, patient with worsening falls over the past 2 to 3 days.  Also with worsening weakness.    * UTI (urinary tract infection) Positive dysuria, increased urinary frequency with recurrent falls in the setting of baseline Parkinson's disease Urinalysis indicative of infection --started on ceftriaxone.  Urine cx wasn't sent on admission. Plan: --cont ceftriaxone --add on urine cx  Falls Worsening falls and weakness at home in the setting of UTI -baseline Parkinson's disease  --PT rec SNF rehab  Dysphagia, ruled out Per the daughter, patient with increasing coughing/choking episodes associated with food and liquids SLP evaluation cleared pt for regular diet  Parkinson's disease Acute decompensation of baseline weakness in the setting of UTI --resume home Sinemet  Cognitive decline --Daughter reported pt has some memory loss, but pt and family denied formal dx of dementia, said pt was confused due to UTI.    DVT prophylaxis: Lovenox SQ Code Status: Full code  Family Communication: daughter updated at bedside today Level of care: Med-Surg Dispo:   The patient is from: home Anticipated d/c is to: SNF rehab Anticipated d/c date is: ready for discharge tomorrow   Subjective and Interval History:  Denied dysuria currently.  Had a discussion with pt and daughter about home vs SNF rehab.   Objective: Vitals:   03/24/23 1334 03/24/23 1551 03/25/23 0015 03/25/23 0836  BP: (!) 156/84 (!) 152/81 (!) 142/59 (!) 153/63  Pulse: 72 72 72 67   Resp: 16 16 18 18   Temp: 97.7 F (36.5 C) 97.9 F (36.6 C) 97.8 F (36.6 C) 97.9 F (36.6 C)  TempSrc:      SpO2: 93% 95% 96% 94%  Weight:      Height:        Intake/Output Summary (Last 24 hours) at 03/25/2023 1525 Last data filed at 03/25/2023 1428 Gross per 24 hour  Intake 1353.21 ml  Output 200 ml  Net 1153.21 ml   Filed Weights   03/24/23 1217  Weight: 70.8 kg    Examination:   Constitutional: NAD, alert, oriented to person and place HEENT: conjunctivae and lids normal, EOMI CV: No cyanosis.   RESP: normal respiratory effort, on RA Neuro: II - XII grossly intact.   Psych: Normal mood and affect.  Appropriate judgement and reason   Data Reviewed: I have personally reviewed labs and imaging studies  Time spent: 50 minutes  Darlin Priestly, MD [facility] If 7PM-7AM, please contact night-coverage 03/25/2023, 3:25 PM

## 2023-03-25 NOTE — TOC Initial Note (Signed)
Transition of Care [facility]) - Initial/Assessment Note    Patient Details  Name: Kimberly Scott MRN: [id number] Date of Birth: June 23, 1928  [facility] CM/SW Contact:    Susa Simmonds, LCSWA Phone Number: 03/25/2023, 2:51 PM  Clinical Narrative: CSW spoke with patients daughter who explained that patient doesn't want to go to SNF. Patients daughter told CSW that she is contacting her brother so he can speak with her as well about going to SNF before coming home. Patients daughter asked CSW to start the SNF process in case her mother changes her mind. [facility]. SNF bed search started.   Expected Discharge Plan: Skilled Nursing Facility Barriers to Discharge: Continued Medical Work up   Patient Goals and CMS Choice Patient states their goals for this hospitalization and ongoing recovery are:: Return home          Expected Discharge Plan and Services       Living arrangements for the past 2 months: Single Family Home                                      Prior Living Arrangements/Services Living arrangements for the past 2 months: Single Family Home            Need for Family Participation in Patient Care: Yes (Comment) Care giver support system in place?: Yes (comment)   Criminal Activity/Legal Involvement Pertinent to Current Situation/Hospitalization: No - Comment as needed  Activities of Daily Living Home Assistive Devices/Equipment: Walker (specify type), Eyeglasses ADL Screening (condition at time of admission) Patient's cognitive ability adequate to safely complete daily activities?: No Is the patient deaf or have difficulty hearing?: No Does the patient have difficulty seeing, even when wearing glasses/contacts?: No Does the patient have difficulty concentrating, remembering, or making decisions?: Yes Patient able to express need for assistance with ADLs?: No Does the patient have difficulty dressing or  bathing?: Yes Independently performs ADLs?: No Communication: Independent Dressing (OT): Needs assistance Is this a change from baseline?: Change from baseline, expected to last >3 days Grooming: Independent Feeding: Independent Bathing: Needs assistance Is this a change from baseline?: Change from baseline, expected to last >3 days Toileting: Needs assistance Is this a change from baseline?: Change from baseline, expected to last >3days In/Out Bed: Needs assistance Is this a change from baseline?: Change from baseline, expected to last >3 days Walks in Home: Needs assistance Is this a change from baseline?: Change from baseline, expected to last >3 days Does the patient have difficulty walking or climbing stairs?: Yes Weakness of Legs: Both Weakness of Arms/Hands: Both  Permission Sought/Granted Permission sought to share information with : Oceanographer granted to share information with : Yes, Verbal Permission Granted              Emotional Assessment Appearance:: Appears stated age     Orientation: : Oriented to Self, Oriented to Place, Oriented to Situation Alcohol / Substance Use: Not Applicable Psych Involvement: No (comment)  Admission diagnosis:  UTI (urinary tract infection) [N39.0] Fall, initial encounter [W19.XXXA] Urinary tract infection without hematuria, site unspecified [N39.0] Altered mental status, unspecified altered mental status type [R41.82] Patient Active Problem List   Diagnosis Date Noted   Falls 03/24/2023   Dysphagia 03/24/2023   Generalized weakness 12/09/2021   Right shoulder pain 12/09/2021   COVID-19 virus infection 12/08/2021   Peripheral neuropathy  12/08/2021   Parkinson's disease 12/08/2021   UTI (urinary tract infection) 07/31/2019   Hyponatremia 05/21/2018   TIA (transient ischemic attack)    Acute metabolic encephalopathy 05/20/2018   Coronary arteriosclerosis 09/28/2012   Hypertension 09/28/2012    Hyperlipidemia 09/28/2012   GERD (gastroesophageal reflux disease) 09/28/2012   Breast cyst 09/28/2012   PCP:  Lynnea Ferrier, MD Pharmacy:   [facility] - [place], [place] - [address] [place] [address] Phone: [phone] Fax: [phone]  [facility] [phone] - Closed - [place], [place] - [address] [place] [address] Phone: [phone] Fax: [phone]  [id number] - [place], [place] - [address] [place] [address] Phone: [phone] Fax: [phone]     Social Determinants of Health (SDOH) Social History: SDOH Screenings   Food Insecurity: No Food Insecurity (03/24/2023)  Housing: Low Risk  (03/24/2023)  Transportation Needs: No Transportation Needs (03/24/2023)  Utilities: Not At Risk (03/24/2023)  Physical Activity: Unknown (07/31/2019)  Social Connections: Unknown (07/31/2019)  Tobacco Use: Low Risk  (03/24/2023)   SDOH Interventions:     Readmission Risk Interventions     No data to display

## 2023-03-25 NOTE — [place] FL2 (Signed)
  [place] MEDICAID FL2 LEVEL OF CARE FORM     IDENTIFICATION  Patient Name: Kimberly Scott Birthdate: October 01, 1928 Sex: female Admission Date (Current Location): 03/24/2023  [facility] and [place] Number:  Chiropodist and Address:  [facility], [address] [place], [place] [address]      Provider Number: [id number]  Attending Physician Name and Address:  Darlin Priestly, MD  Relative Name and Phone Number:  Rocco Pauls, Daughter, [phone]    Current Level of Care: Hospital Recommended Level of Care: Skilled Nursing Facility Prior Approval Number:    Date Approved/Denied:   PASRR Number: [id number]  Discharge Plan: SNF    Current Diagnoses: Patient Active Problem List   Diagnosis Date Noted   Falls 03/24/2023   Dysphagia 03/24/2023   Generalized weakness 12/09/2021   Right shoulder pain 12/09/2021   COVID-19 virus infection 12/08/2021   Peripheral neuropathy 12/08/2021   Parkinson's disease 12/08/2021   UTI (urinary tract infection) 07/31/2019   Hyponatremia 05/21/2018   TIA (transient ischemic attack)    Acute metabolic encephalopathy 05/20/2018   Coronary arteriosclerosis 09/28/2012   Hypertension 09/28/2012   Hyperlipidemia 09/28/2012   GERD (gastroesophageal reflux disease) 09/28/2012   Breast cyst 09/28/2012    Orientation RESPIRATION BLADDER Height & Weight     Self, Situation, Place  Normal Continent Weight: 156 lb 1.4 oz (70.8 kg) Height:  5' (152.4 cm)  BEHAVIORAL SYMPTOMS/MOOD NEUROLOGICAL BOWEL NUTRITION STATUS      Continent  (Regular. See discharge summary)  AMBULATORY STATUS COMMUNICATION OF NEEDS Skin   Extensive Assist Verbally Normal                       Personal Care Assistance Level of Assistance  Bathing, Dressing, Feeding Bathing Assistance: Maximum assistance Feeding assistance: Independent Dressing Assistance: Maximum assistance     Functional Limitations Info  Sight, Hearing, Speech  Sight Info: Adequate Hearing Info: Adequate Speech Info: Adequate    SPECIAL CARE FACTORS FREQUENCY  PT (By licensed PT), OT (By licensed OT)     PT Frequency: Min 3X/week OT Frequency: Min 2X/week            Contractures Contractures Info: Not present    Additional Factors Info  Code Status, Allergies Code Status Info: Full Allergies Info: Bacitracin-neomycin-polymyxin, Ace Inhibitors, Omega 3  (fish Oil), Neosporin (neomycin-bacitracin Zn-polymyx), Tape           Current Medications (03/25/2023):  This is the current hospital active medication list Current Facility-Administered Medications  Medication Dose Route Frequency Provider Last Rate Last Admin   cefTRIAXone (ROCEPHIN) 1 g in sodium chloride 0.9 % 100 mL IVPB  1 g Intravenous Q24H Floydene Flock, MD 200 mL/hr at 03/25/23 1323 1 g at 03/25/23 1323   enoxaparin (LOVENOX) injection 40 mg  40 mg Subcutaneous Q24H Floydene Flock, MD   40 mg at 03/24/23 2231     Discharge Medications: Please see discharge summary for a list of discharge medications.  Relevant Imaging Results:  Relevant Lab Results:   Additional Information SSN:[id number]  Susa Simmonds, LCSWA

## 2023-03-26 DIAGNOSIS — N3000 Acute cystitis without hematuria: Secondary | ICD-10-CM | POA: Diagnosis not present

## 2023-03-26 MED ORDER — AMOXICILLIN-POT CLAVULANATE 875-125 MG PO TABS
1.0000 | ORAL_TABLET | Freq: Two times a day (BID) | ORAL | Status: DC
  Administered 2023-03-26 – 2023-03-27 (×3): 1 via ORAL
  Filled 2023-03-26 (×3): qty 1

## 2023-03-26 NOTE — Progress Notes (Signed)
  PROGRESS NOTE    Kimberly Scott  [id number]:[id number] DOB: [birth year] DOA: 03/24/2023 PCP: Lynnea Ferrier, MD  156A/[id number]-AA  LOS: 2 days   Brief hospital course:   Assessment & Plan: Kimberly Scott is a 89 y.o. female with medical history significant of Parkinson disease, hypertension, coronary artery disease presenting with fall, UTI, weakness, dysphagia.  History primarily from patient's daughter in the setting of dementia.  Per report, patient with worsening falls over the past 2 to 3 days.  Also with worsening weakness.    * UTI (urinary tract infection) Positive dysuria, increased urinary frequency with recurrent falls in the setting of baseline Parkinson's disease Urinalysis indicative of infection --started on ceftriaxone.  Urine cx wasn't sent on admission, but added on later. Plan: --switch to Augmentin today because pt lost IV and didn't want another one placed --f/u rine cx  Falls Worsening falls and weakness at home in the setting of UTI -baseline Parkinson's disease  --PT, SNF rehab  Dysphagia, ruled out Per the daughter, patient with increasing coughing/choking episodes associated with food and liquids SLP evaluation cleared pt for regular diet  Parkinson's disease Acute decompensation of baseline weakness in the setting of UTI --cont home Sinemet  Cognitive decline --Daughter reported pt has some memory loss, but pt and family denied formal dx of dementia, said pt was confused due to UTI.    DVT prophylaxis: Lovenox SQ Code Status: Full code  Family Communication: daughter updated at bedside today Level of care: Med-Surg Dispo:   The patient is from: home Anticipated d/c is to: SNF rehab Anticipated d/c date is: whenever bed available   Subjective and Interval History:  No new complaint today.  Family convinced pt to go to rehab for short term.   Objective: Vitals:   03/25/23 0836 03/25/23 1616 03/26/23 0025 03/26/23 0907  BP: (!) 153/63 (!)  143/57 (!) 144/52 (!) 169/63  Pulse: 67 69 73 69  Resp: 18 16 16 18   Temp: 97.9 F (36.6 C) 98.3 F (36.8 C) 98.2 F (36.8 C) 97.8 F (36.6 C)  TempSrc:      SpO2: 94% 95% 95% 95%  Weight:      Height:        Intake/Output Summary (Last 24 hours) at 03/26/2023 1440 Last data filed at 03/26/2023 1438 Gross per 24 hour  Intake 1080 ml  Output 1550 ml  Net -470 ml   Filed Weights   03/24/23 1217  Weight: 70.8 kg    Examination:   Constitutional: NAD, alert, oriented to person and place HEENT: conjunctivae and lids normal, EOMI CV: No cyanosis.   RESP: normal respiratory effort, on RA Neuro: II - XII grossly intact.   Psych: flat mood and affect.     Data Reviewed: I have personally reviewed labs and imaging studies  Time spent: 35 minutes  Darlin Priestly, MD [facility] If 7PM-7AM, please contact night-coverage 03/26/2023, 2:40 PM

## 2023-03-27 DIAGNOSIS — R531 Weakness: Secondary | ICD-10-CM | POA: Diagnosis not present

## 2023-03-27 LAB — URINE CULTURE: Culture: 90000 — AB

## 2023-03-27 MED ORDER — NITROFURANTOIN MONOHYD MACRO 100 MG PO CAPS
100.0000 mg | ORAL_CAPSULE | Freq: Two times a day (BID) | ORAL | Status: DC
  Administered 2023-03-27 – 2023-03-29 (×4): 100 mg via ORAL
  Filled 2023-03-27 (×4): qty 1

## 2023-03-27 MED ORDER — NITROFURANTOIN MONOHYD MACRO 100 MG PO CAPS
100.0000 mg | ORAL_CAPSULE | Freq: Two times a day (BID) | ORAL | Status: DC
  Administered 2023-03-27: 100 mg via ORAL
  Filled 2023-03-27: qty 1

## 2023-03-27 NOTE — TOC Progression Note (Addendum)
Transition of Care [facility]) - Progression Note    Patient Details  Name: Kimberly Scott MRN: [id number] Date of Birth: 05-09-1928  Transition of Care [facility]) CM/SW Contact  [name], RN Phone Number: 03/27/2023, 2:26 PM  Clinical Narrative:    Met with the patient and reviewed the bed offers She stated that she lives at home alone and loves it, she will think about it and let me know, I explained that We need to get Ins approval so we would need to know the decision ASAP She stated understanding Update, the patient and her daughter reviewed the bed options and let me know that they chose [facility], Ins \\started  ref number [phone]  Expected Discharge Plan: Skilled Nursing Facility Barriers to Discharge: Continued Medical Work up  Expected Discharge Plan and Services       Living arrangements for the past 2 months: Single Family Home                                       Social Determinants of Health (SDOH) Interventions SDOH Screenings   Food Insecurity: No Food Insecurity (03/24/2023)  Housing: Low Risk  (03/24/2023)  Transportation Needs: No Transportation Needs (03/24/2023)  Utilities: Not At Risk (03/24/2023)  Physical Activity: Unknown (07/31/2019)  Social Connections: Unknown (07/31/2019)  Tobacco Use: Low Risk  (03/24/2023)    Readmission Risk Interventions     No data to display

## 2023-03-27 NOTE — Progress Notes (Signed)
PROGRESS NOTE    Kimberly Scott  ZOX:[id number] DOB: Jan 02, 1928 DOA: 03/24/2023 PCP: Lynnea Ferrier, MD  156A/[id number]-AA  LOS: 3 days   Brief hospital course:   Assessment & Plan: Kimberly Scott is a 89 y.o. female with medical history significant of Parkinson disease, hypertension, coronary artery disease presenting with fall, UTI, weakness, dysphagia.  History primarily from patient's daughter in the setting of dementia.  Per report, patient with worsening falls over the past 2 to 3 days.  Also with worsening weakness.    * Asymptomatic bacteruria  --family reported Positive dysuria, increased urinary frequency with recurrent falls which they claimed was typical of pt's past UTI episodes.  Pt was started on ceftriaxone, which improvement in all her supposed UTI symptoms, however, urine cx returned ESBL E coli which is resistant to ceftriaxone.  Discussed at length with daughter that pt's improvement was more likely due to IVF hydration, and that I did not recommend changing abx to tx pt's ESBL E coli in her urine, however, daughter still wanted pt to received abx, and then also asked for ID consult. Plan: --start Macrobid --ID consult  Falls Worsening falls and weakness at home in the setting of baseline Parkinson's disease and likely dehydration. --PT, SNF rehab  Reduced oral intake Dehydration. --daughter reported pt not eating or drinking enough fluids.  BUN and Cr slightly more elevated than baseline.  Dysphagia, ruled out Per the daughter, patient with increasing coughing/choking episodes associated with food and liquids. SLP evaluation cleared pt for regular diet.  Parkinson's disease Acute decompensation of baseline weakness in the setting of dehydration. --cont home Sinemet  Cognitive decline --Daughter reported pt has some memory loss, but pt and family denied formal dx of dementia, said pt was confused due to UTI, thought pt's mental status improved without  having received sensitive abx.   DVT prophylaxis: Lovenox SQ Code Status: Full code  Family Communication: daughter updated at bedside today Level of care: Med-Surg Dispo:   The patient is from: home Anticipated d/c is to: SNF rehab Anticipated d/c date is: whenever bed available   Subjective and Interval History:  Pt denied dysuria.  No new complaints.  Urine cx pos for ESBL E coli resistant to ceftriaxone.  I spent about 30 min with pt and daughter to discuss, and then recommended not treating asymptomatic bacteruria, especially in light of pt's development of multi-drug resistance from prior frequent treatment of UTI's (with symptoms usually of confusion, weakness and smelly urine, which all could be symptoms of dehydration).    Pt later decided she wanted her mother's urine ESBL E coli treated, and then also requested ID consult.   Objective: Vitals:   03/26/23 1526 03/27/23 0044 03/27/23 0820 03/27/23 1614  BP: (!) 139/55 (!) 133/51 (!) 150/70 (!) 147/78  Pulse: 72 71 69 67  Resp: 18 16 18 18   Temp: 97.9 F (36.6 C) 98.1 F (36.7 C) 97.9 F (36.6 C) 97.9 F (36.6 C)  TempSrc:      SpO2: 95% 94% 95% 94%  Weight:      Height:        Intake/Output Summary (Last 24 hours) at 03/27/2023 1822 Last data filed at 03/26/2023 1917 Gross per 24 hour  Intake 240 ml  Output --  Net 240 ml   Filed Weights   03/24/23 1217  Weight: 70.8 kg    Examination:   Constitutional: NAD, alert, oriented to person and place HEENT: conjunctivae and lids normal, EOMI CV:  No cyanosis.   RESP: normal respiratory effort, on RA Neuro: II - XII grossly intact.   Psych: flat mood and affect.     Data Reviewed: I have personally reviewed labs and imaging studies  Time spent: 50 minutes  Darlin Priestly, MD [facility] If 7PM-7AM, please contact night-coverage 03/27/2023, 6:22 PM

## 2023-03-27 NOTE — Progress Notes (Signed)
Occupational Therapy Treatment Patient Details Name: Kimberly Scott MRN: [id number] DOB: [birth year] Today's Date: 03/27/2023   History of present illness 89 y/o female presented to ED on 03/24/23 for fall, UTI, weakness, and dysphagia. Admitted for UTI. PMH: Parkinson's disease, TIA, CAD, HTN   OT comments  Pt seen for OT tx this date. Pt required MIN-MOD A for bed mobility to EOB, MIN-MOD A +2 for STS and MIN A +2 to step pivot with RW to recliner. Pt/dtr educated in compensatory strategies to promote fluid intake at home  as dtr reports this is a problem. Pt progressing towards goals. Continues to benefit from skilled OT services. Continue with POC.    Recommendations for follow up therapy are one component of a multi-disciplinary discharge planning process, led by the attending physician.  Recommendations may be updated based on patient status, additional functional criteria and insurance authorization.    Assistance Recommended at Discharge Frequent or constant Supervision/Assistance  Patient can return home with the following  A lot of help with walking and/or transfers;A lot of help with bathing/dressing/bathroom;Assistance with cooking/housework;Assist for transportation;Help with stairs or ramp for entrance;Direct supervision/assist for financial management;Direct supervision/assist for medications management   Equipment Recommendations  Other (comment) (defer to next venue)    Recommendations for Other Services      Precautions / Restrictions Precautions Precautions: Fall Precaution Comments: hx of Parkinson's Restrictions Weight Bearing Restrictions: No       Mobility Bed Mobility Overal bed mobility: Needs Assistance Bed Mobility: Supine to Sit     Supine to sit: Min assist, Mod assist     General bed mobility comments: assist for BLE    Transfers Overall transfer level: Needs assistance Equipment used: Rolling walker (2 wheels) Transfers: Sit to/from  Stand, Bed to chair/wheelchair/BSC Sit to Stand: Mod assist, +2 physical assistance, Min assist     Step pivot transfers: Min assist, +2 physical assistance, +2 safety/equipment           Balance Overall balance assessment: Needs assistance Sitting-balance support: Feet supported, Bilateral upper extremity supported, Single extremity supported Sitting balance-Leahy Scale: Fair     Standing balance support: Reliant on assistive device for balance, During functional activity, Bilateral upper extremity supported Standing balance-Leahy Scale: Poor                             ADL either performed or assessed with clinical judgement   ADL                                              Extremity/Trunk Assessment              Vision       Perception     Praxis      Cognition Arousal/Alertness: Awake/alert Behavior During Therapy: WFL for tasks assessed/performed Overall Cognitive Status: Impaired/Different from baseline Area of Impairment: Safety/judgement, Memory, Following commands                     Memory: Decreased short-term memory Following Commands: Follows one step commands consistently Safety/Judgement: Decreased awareness of safety, Decreased awareness of deficits              Exercises Other Exercises Other Exercises: Pt/dtr educated in compensatory strategies to promote fluid intake at home  as dtr reports this is  a problem    Shoulder Instructions       General Comments      Pertinent Vitals/ Pain       Pain Assessment Pain Assessment: No/denies pain  Home Living                                          Prior Functioning/Environment              Frequency  Min 2X/week        Progress Toward Goals  OT Goals(current goals can now be found in the care plan section)  Progress towards OT goals: Progressing toward goals  Acute Rehab OT Goals Patient Stated Goal: go to  rehab and get stronger OT Goal Formulation: With patient/family Time For Goal Achievement: 04/07/23 Potential to Achieve Goals: Fair  Plan Discharge plan remains appropriate;Frequency remains appropriate    Co-evaluation                 AM-PAC OT "6 Clicks" Daily Activity     Outcome Measure   Help from another person eating meals?: None Help from another person taking care of personal grooming?: A Little Help from another person toileting, which includes using toliet, bedpan, or urinal?: A Lot Help from another person bathing (including washing, rinsing, drying)?: A Lot Help from another person to put on and taking off regular upper body clothing?: A Lot Help from another person to put on and taking off regular lower body clothing?: A Lot 6 Click Score: 15    End of Session Equipment Utilized During Treatment: Rolling walker (2 wheels)  OT Visit Diagnosis: Unsteadiness on feet (R26.81);Repeated falls (R29.6);Muscle weakness (generalized) (M62.81);History of falling (Z91.81)   Activity Tolerance Patient tolerated treatment well   Patient Left in chair;with call bell/phone within reach;with chair alarm set;with family/visitor present   Nurse Communication          Time: 1610-9604 OT Time Calculation (min): 17 min  Charges: OT General Charges $OT Visit: 1 Visit OT Treatments $Self Care/Home Management : 8-22 mins  Arman Filter., MPH, MS, OTR/L ascom [phone] 03/27/23, 4:49 PM

## 2023-03-27 NOTE — Care Management Important Message (Signed)
Important Message  Patient Details  Name: Kimberly Scott MRN: [id number] Date of Birth: December 15, 1927   Medicare Important Message Given:  Yes     Johnell Comings 03/27/2023, 10:56 AM

## 2023-03-27 NOTE — Plan of Care (Signed)
  Problem: Education: Goal: Knowledge of General Education information will improve Description: Including pain rating scale, medication(s)/side effects and non-pharmacologic comfort measures 03/27/2023 2309 by Sheria Lang, RN Outcome: Progressing 03/27/2023 2307 by Sheria Lang, RN Outcome: Progressing   Problem: Health Behavior/Discharge Planning: Goal: Ability to manage health-related needs will improve 03/27/2023 2309 by Sheria Lang, RN Outcome: Progressing 03/27/2023 2307 by Sheria Lang, RN Outcome: Progressing   Problem: Clinical Measurements: Goal: Ability to maintain clinical measurements within normal limits will improve 03/27/2023 2309 by Sheria Lang, RN Outcome: Progressing 03/27/2023 2307 by Sheria Lang, RN Outcome: Progressing Goal: Will remain free from infection 03/27/2023 2309 by Sheria Lang, RN Outcome: Progressing 03/27/2023 2307 by Sheria Lang, RN Outcome: Progressing Goal: Diagnostic test results will improve 03/27/2023 2309 by Sheria Lang, RN Outcome: Progressing 03/27/2023 2307 by Sheria Lang, RN Outcome: Progressing Goal: Respiratory complications will improve 03/27/2023 2309 by Sheria Lang, RN Outcome: Progressing 03/27/2023 2307 by Sheria Lang, RN Outcome: Progressing Goal: Cardiovascular complication will be avoided 03/27/2023 2309 by Sheria Lang, RN Outcome: Progressing 03/27/2023 2307 by Sheria Lang, RN Outcome: Progressing   Problem: Activity: Goal: Risk for activity intolerance will decrease 03/27/2023 2309 by Sheria Lang, RN Outcome: Progressing 03/27/2023 2307 by Sheria Lang, RN Outcome: Progressing   Problem: Nutrition: Goal: Adequate nutrition will be maintained 03/27/2023 2309 by Sheria Lang, RN Outcome: Progressing 03/27/2023 2307 by Sheria Lang, RN Outcome: Progressing   Problem: Coping: Goal: Level of anxiety will decrease 03/27/2023 2309 by Sheria Lang, RN Outcome: Progressing 03/27/2023 2307 by Sheria Lang, RN Outcome: Progressing    Problem: Elimination: Goal: Will not experience complications related to bowel motility 03/27/2023 2309 by Sheria Lang, RN Outcome: Progressing 03/27/2023 2307 by Sheria Lang, RN Outcome: Progressing Goal: Will not experience complications related to urinary retention 03/27/2023 2309 by Sheria Lang, RN Outcome: Progressing 03/27/2023 2307 by Sheria Lang, RN Outcome: Progressing   Problem: Pain Managment: Goal: General experience of comfort will improve 03/27/2023 2309 by Sheria Lang, RN Outcome: Progressing 03/27/2023 2307 by Sheria Lang, RN Outcome: Progressing   Problem: Safety: Goal: Ability to remain free from injury will improve 03/27/2023 2309 by Sheria Lang, RN Outcome: Progressing 03/27/2023 2307 by Sheria Lang, RN Outcome: Progressing   Problem: Skin Integrity: Goal: Risk for impaired skin integrity will decrease 03/27/2023 2309 by Sheria Lang, RN Outcome: Progressing 03/27/2023 2307 by Sheria Lang, RN Outcome: Progressing

## 2023-03-28 DIAGNOSIS — R531 Weakness: Secondary | ICD-10-CM | POA: Diagnosis not present

## 2023-03-28 DIAGNOSIS — N3000 Acute cystitis without hematuria: Secondary | ICD-10-CM | POA: Diagnosis not present

## 2023-03-28 DIAGNOSIS — Z1612 Extended spectrum beta lactamase (ESBL) resistance: Secondary | ICD-10-CM

## 2023-03-28 DIAGNOSIS — B9620 Unspecified Escherichia coli [E. coli] as the cause of diseases classified elsewhere: Secondary | ICD-10-CM

## 2023-03-28 MED ORDER — ESTRADIOL 0.1 MG/GM VA CREA
1.0000 | TOPICAL_CREAM | VAGINAL | Status: DC
  Administered 2023-03-28: 1 via VAGINAL
  Filled 2023-03-28: qty 42.5

## 2023-03-28 NOTE — TOC Progression Note (Signed)
Transition of Care [facility]) - Progression Note    Patient Details  Name: Kimberly Scott MRN: [id number] Date of Birth: 12/19/1927  [facility]) CM/SW Contact  [name], RN Phone Number: 03/28/2023, 3:21 PM  Clinical Narrative:    Ins approved to go to [facility] [id number]   Expected Discharge Plan: Skilled Nursing Facility Barriers to Discharge: Continued Medical Work up  Expected Discharge Plan and Services       Living arrangements for the past 2 months: Single Family Home                                       Social Determinants of Health (SDOH) Interventions SDOH Screenings   Food Insecurity: No Food Insecurity (03/24/2023)  Housing: Low Risk  (03/24/2023)  Transportation Needs: No Transportation Needs (03/24/2023)  Utilities: Not At Risk (03/24/2023)  Physical Activity: Unknown (07/31/2019)  Social Connections: Unknown (07/31/2019)  Tobacco Use: Low Risk  (03/24/2023)    Readmission Risk Interventions     No data to display

## 2023-03-28 NOTE — Progress Notes (Signed)
PROGRESS NOTE    MARGIA WIESEN  ZOX:[id number] DOB: Dec 25, 1927 DOA: 03/24/2023 PCP: Lynnea Ferrier, MD  156A/[id number]-AA  LOS: 4 days   Brief hospital course:   Assessment & Plan: ANGELEAH LABRAKE is a 89 y.o. female with medical history significant of Parkinson disease, hypertension, coronary artery disease presenting with fall, UTI, weakness, dysphagia.  History primarily from patient's daughter in the setting of dementia.  Per report, patient with worsening falls over the past 2 to 3 days.  Also with worsening weakness.    * Asymptomatic bacteruria  UTI, ruled out Colonization urinary tract with E. Coli, ESBL --family reported Positive dysuria, increased urinary frequency with recurrent falls which they claimed was typical of pt's past UTI episodes.  Pt was started on ceftriaxone, which improvement in all her supposed UTI symptoms, however, urine cx returned ESBL E coli which is resistant to ceftriaxone.  Discussed at length with daughter that pt's improvement was more likely due to IVF hydration, and that I did not recommend changing abx to tx pt's ESBL E coli in her urine, however, daughter still wanted pt to received abx, and then also asked for ID consult. --started on Macrobid  Plan: --ID consult today, reinforced the above with daughter --Molli Knock to complete Macrobid x 5 days.  --Refer to urology outpatient, per ID rec --adherence to home estradiol cream (3 times per week) for possible burning due to vaginal dryness  Falls Worsening falls and weakness at home in the setting of baseline Parkinson's disease and likely dehydration. --PT, SNF rehab  Reduced oral intake Dehydration. --daughter reported pt not eating or drinking enough fluids.  BUN and Cr slightly more elevated than baseline.  Dysphagia, ruled out Per the daughter, patient with increasing coughing/choking episodes associated with food and liquids. SLP evaluation cleared pt for regular diet.  Parkinson's  disease Acute decompensation of baseline weakness in the setting of dehydration. --cont home Sinemet  Cognitive decline --Daughter reported pt has some memory loss, but pt and family denied formal dx of dementia, said pt was confused due to UTI, thought pt's mental status improved without having received sensitive abx.   DVT prophylaxis: Lovenox SQ Code Status: Full code  Family Communication: daughter updated at bedside today Level of care: Med-Surg Dispo:   The patient is from: home Anticipated d/c is to: SNF rehab Anticipated d/c date is: whenever bed available   Subjective and Interval History:  Daughter reported pt had burning, though not necessarily correlated with voiding.  No other change.   Objective: Vitals:   03/27/23 1614 03/27/23 2322 03/28/23 0841 03/28/23 1657  BP: (!) 147/78 (!) 137/45 (!) 181/59 130/83  Pulse: 67 70 62 72  Resp: 18 20 18 18   Temp: 97.9 F (36.6 C) 98.3 F (36.8 C) 98.1 F (36.7 C) 98.5 F (36.9 C)  TempSrc:      SpO2: 94% 95% 96% 93%  Weight:      Height:       No intake or output data in the 24 hours ending 03/28/23 1935  Filed Weights   03/24/23 1217  Weight: 70.8 kg    Examination:   Constitutional: NAD, alert, oriented to person and place HEENT: conjunctivae and lids normal, EOMI CV: No cyanosis.   RESP: normal respiratory effort, on RA Neuro: II - XII grossly intact.   Psych: flat mood and affect.     Data Reviewed: I have personally reviewed labs and imaging studies  Time spent: 35 minutes  Darlin Priestly,  MD [facility] If 7PM-7AM, please contact night-coverage 03/28/2023, 7:35 PM

## 2023-03-28 NOTE — Progress Notes (Signed)
Physical Therapy Treatment Patient Details Name: Kimberly Scott MRN: [id number] DOB: [birth year] Today's Date: 03/28/2023   History of Present Illness 89 y/o female presented to ED on 03/24/23 for fall, UTI, weakness, and dysphagia. Admitted for UTI. PMH: Parkinson's disease, TIA, CAD, HTN    PT Comments    Pt seen for PT tx with pt agreeable, reporting need to void. PT assisted pt to [facility] where pt had continent void & performed peri hygiene with extra time. Pt requires min/mod assist for STS transfers with cuing for hand placement. Pt presents with significantly flexed posture, able to correct with PT assistance but unable to maintain. Pt ambulates 8 ft + 6 ft with mod assist & chair follow for safety with encouragement to continue ambulating during each task. Pt presents with significantly impaired gait pattern as noted below, as well as impaired awareness. Continue to recommend ongoing PT treatment to address strengthening, balance, & endurance to increase independence & decrease fall risk with functional mobility.    Recommendations for follow up therapy are one component of a multi-disciplinary discharge planning process, led by the attending physician.  Recommendations may be updated based on patient status, additional functional criteria and insurance authorization.  Follow Up Recommendations  Can patient physically be transported by private vehicle: Yes    Assistance Recommended at Discharge Frequent or constant Supervision/Assistance  Patient can return home with the following A lot of help with walking and/or transfers;A lot of help with bathing/dressing/bathroom;Assistance with cooking/housework;Direct supervision/assist for medications management;Direct supervision/assist for financial management;Assist for transportation;Help with stairs or ramp for entrance   Equipment Recommendations  None recommended by PT    Recommendations for Other Services       Precautions /  Restrictions Precautions Precautions: Fall Precaution Comments: hx of Parkinson's Restrictions Weight Bearing Restrictions: No     Mobility  Bed Mobility               General bed mobility comments: not tested, pt received & left sitting in recliner    Transfers Overall transfer level: Needs assistance Equipment used: Rolling walker (2 wheels) Transfers: Sit to/from Stand, Bed to chair/wheelchair/BSC Sit to Stand: Min assist, Mod assist (cuing/education re: hand placement, assistance to power up to standing)   Step pivot transfers: Mod assist (bed<>BSC with RW & mod assist, cuing to fully step/turn to [facility] prior to sitting)            Ambulation/Gait Ambulation/Gait assistance: Mod assist Gait Distance (Feet): 8 Feet (+ 6 ft) Assistive device: Rolling walker (2 wheels) Gait Pattern/deviations: Decreased step length - right, Decreased step length - left, Decreased stride length, Decreased dorsiflexion - right, Decreased dorsiflexion - left Gait velocity: decreased     General Gait Details: Pt dragging BLE (R worse than L), PT provides cuing & manual facilitation for upright posture but pt unable to sustain without assistance so reverts back to forward flexion/trunk lean on RW. Seated rest break between each gait trial but PT provides close chair follow during each gait trial for safety; pt requires encouragement to continue ambulating during each trial.   Stairs             Wheelchair Mobility    Modified Rankin (Stroke Patients Only)       Balance Overall balance assessment: Needs assistance Sitting-balance support: Feet supported, Bilateral upper extremity supported, Single extremity supported Sitting balance-Leahy Scale: Fair     Standing balance support: Reliant on assistive device for balance, During functional activity, Bilateral upper extremity  supported Standing balance-Leahy Scale: Poor                              Cognition  Arousal/Alertness: Awake/alert Behavior During Therapy: WFL for tasks assessed/performed Overall Cognitive Status: Impaired/Different from baseline Area of Impairment: Safety/judgement, Memory, Following commands                       Following Commands: Follows one step commands consistently, Follows one step commands with increased time Safety/Judgement: Decreased awareness of safety, Decreased awareness of deficits Awareness: Intellectual            Exercises      General Comments General comments (skin integrity, edema, etc.): Pt with continent void on BSC, performs peri hygiene with extra time.      Pertinent Vitals/Pain Pain Assessment Pain Assessment: Faces Faces Pain Scale: Hurts a little bit Pain Location: discomfort in back, hips Pain Descriptors / Indicators: Discomfort Pain Intervention(s): Monitored during session, Repositioned    Home Living                          Prior Function            PT Goals (current goals can now be found in the care plan section) Acute Rehab PT Goals Patient Stated Goal: to get out of here PT Goal Formulation: With patient/family Time For Goal Achievement: 04/07/23 Potential to Achieve Goals: Fair Progress towards PT goals: Progressing toward goals    Frequency    Min 3X/week      PT Plan Current plan remains appropriate    Co-evaluation              AM-PAC PT "6 Clicks" Mobility   Outcome Measure  Help needed turning from your back to your side while in a flat bed without using bedrails?: A Lot Help needed moving from lying on your back to sitting on the side of a flat bed without using bedrails?: A Lot Help needed moving to and from a bed to a chair (including a wheelchair)?: A Lot Help needed standing up from a chair using your arms (e.g., wheelchair or bedside chair)?: A Lot Help needed to walk in hospital room?: A Lot Help needed climbing 3-5 steps with a railing? : Total 6 Click  Score: 11    End of Session   Activity Tolerance: Patient tolerated treatment well Patient left: in chair;with chair alarm set;with call bell/phone within reach   PT Visit Diagnosis: Muscle weakness (generalized) (M62.81);History of falling (Z91.81);Unsteadiness on feet (R26.81);Repeated falls (R29.6);Difficulty in walking, not elsewhere classified (R26.2)     Time: 4132-4401 PT Time Calculation (min) (ACUTE ONLY): 24 min  Charges:  $Therapeutic Activity: 23-37 mins                     Aleda Grana, PT, DPT 03/28/23, 3:23 PM   Sandi Mariscal 03/28/2023, 3:20 PM

## 2023-03-28 NOTE — Consult Note (Signed)
[facility]    Date of Admission:  03/24/2023   Total days of inpatient antibiotics 4        Reason for Consult: ESBL Ecoli + Ruine Cx    Principal Problem:   UTI (urinary tract infection) Active Problems:   TIA (transient ischemic attack)   Parkinson's disease   Falls   Dysphagia   Assessment: 89 year old female with Parkinson's disease,  dementia female admitted after having falls, weakness and suspected UTI(family had reported dysuria) urine cultures grew ESBL E. coli.  #Colonization urinary tract with E. Coli, ESBL.   On record review and noted that patient has grown E. coli although has become more resistant since 2020. - She was given IV fluids and improved, initially treated with ceftriaxone then transition to Augmentin. - I spoke with daughter in great detail about patient's history.  Noted that she has not had  hospitalizations that seem  due to UTI, she did have a COVID hospitalization where her urine cultures grew E. Coli(I suspect this was not a true UTI).  No history of E. coli bacteremia.  She has not seen urology.  But her OB/GYN did write her a prescription for estradiol cream which she uses intermittently. - Her only symptom seems to be dysuria, which she has had chronically on and off it is unclear from history if patient truly has dysuria or just vaginal dryness.  Regardless the symptoms has been ongoing for months as/is not consistent with a urinary tract infection.  Recommendations: - Refer to urology outpatient - Agree with adherence to estradiol cream -I discussed with daughter that would avoid prophylactic antibiotics in this as it is unclear if she has had a UTI in the past.  In addition,  would like to avoid chronic suppression with abx as to avoid further pressure for resistance and to preserve PO options.  Discussed the E. coli is likely colonizing her urinary tract as she has grown it since 2020 in her urine cultures.   Reviewed symptoms of a UTI including dysuria, abdominal pain, fevers, chills.  I did stress that recurrent falls, order, appearance of urine is not indicative of UTI.  Current dysuria is chronic for months and unlikely due to UTI. - Okay to complete Macrobid x 5 days. - ID will sign off    I have personally spent 85 minutes involved in face-to-face and non-face-to-face activities for this patient on the day of the visit. Professional time spent includes the following activities: Preparing to see the patient (review of tests), Obtaining and/or reviewing separately obtained history (admission/discharge record), Performing a medically appropriate examination and/or evaluation , Ordering medications/tests/procedures, referring and communicating with other health care professionals, Documenting clinical information in the EMR, Independently interpreting results (not separately reported), Communicating results to the patient/family/caregiver, Counseling and educating the patient/family/caregiver and Care coordination (not separately reported).   Microbiology:   Antibiotics: Ceftriaxone 4/26-27 Augmentin 4/28-29 Macrobid 4/29-present Urine 4/26 ESBL ecoli   HPI: Kimberly Scott is a 89 y.o. female with history of Parkinson's disease, hypertension, CAD, osteoarthritis, recurrent UTIs presented after having falls, weakness, dysphagia.  Daughter provides history in the setting of dementia.  Family reported positive dysuria, increased frequency with recurrent falls that was consistent with past UTI's.  Started on ceftriaxone/and Augmentin and urine cultures return yes ESBL E. coli/transition of Macrobid.  Patient was afebrile without leukocytosis on admission, responded to IV fluids.  ID engaged as daughter wanted to speak  to a specialist in regards to recurrent UTIs.   Review of Systems: Review of Systems  All other systems reviewed and are negative.   Past Medical History:  Diagnosis Date    Anginal pain (HCC)    Arthritis    Cancer (HCC)    FACIAL SKIN CANCER   Coronary artery disease    Headache(784.0)    Heart murmur    Hypertension    Pneumonia    hx of PNA    Social History   Tobacco Use   Smoking status: Never   Smokeless tobacco: Never  Vaping Use   Vaping Use: Never used  Substance Use Topics   Alcohol use: No   Drug use: No    History reviewed. No pertinent family history. Scheduled Meds:  aspirin EC  81 mg Oral Daily   carbidopa-levodopa  1 tablet Oral TID   carbidopa-levodopa  1 tablet Oral Daily   diclofenac Sodium  2 g Topical QID   enoxaparin (LOVENOX) injection  40 mg Subcutaneous Q24H   estradiol  1 Applicatorful Vaginal Once per day on Mon Wed Fri   feeding supplement  237 mL Oral TID BM   irbesartan  75 mg Oral Daily   nitrofurantoin (macrocrystal-monohydrate)  100 mg Oral Q12H   Continuous Infusions: PRN Meds:. Allergies  Allergen Reactions   Bacitracin-Neomycin-Polymyxin Swelling and Rash    Blisters Blisters    Ace Inhibitors     Other reaction(s): Cough   Omega 3  [Fish Oil] Other (See Comments)   Neosporin [Neomycin-Bacitracin Zn-Polymyx] Rash   Tape Rash    Blisters    OBJECTIVE: Blood pressure (!) 181/59, pulse 62, temperature 98.1 F (36.7 C), resp. rate 18, height 5' (1.524 m), weight 70.8 kg, SpO2 96 %.  Physical Exam Constitutional:      Appearance: Normal appearance.  HENT:     Head: Normocephalic and atraumatic.     Right Ear: Tympanic membrane normal.     Left Ear: Tympanic membrane normal.     Nose: Nose normal.     Mouth/Throat:     Mouth: Mucous membranes are moist.  Eyes:     Extraocular Movements: Extraocular movements intact.     Conjunctiva/sclera: Conjunctivae normal.     Pupils: Pupils are equal, round, and reactive to light.  Cardiovascular:     Rate and Rhythm: Normal rate and regular rhythm.     Heart sounds: No murmur heard.    No friction rub. No gallop.  Pulmonary:     Effort:  Pulmonary effort is normal.     Breath sounds: Normal breath sounds.  Abdominal:     General: Abdomen is flat.     Palpations: Abdomen is soft.  Musculoskeletal:        General: Normal range of motion.  Skin:    General: Skin is warm and dry.  Neurological:     General: No focal deficit present.     Mental Status: She is alert and oriented to person, place, and time.  Psychiatric:        Mood and Affect: Mood normal.     Lab Results Lab Results  Component Value Date   WBC 6.5 03/25/2023   HGB 10.8 (L) 03/25/2023   HCT 33.2 (L) 03/25/2023   MCV 95.7 03/25/2023   PLT 201 03/25/2023    Lab Results  Component Value Date   CREATININE 0.98 03/25/2023   BUN 18 03/25/2023   NA 136 03/25/2023   K 3.8 03/25/2023  CL 109 03/25/2023   CO2 21 (L) 03/25/2023    Lab Results  Component Value Date   ALT 11 03/25/2023   AST 27 03/25/2023   ALKPHOS 53 03/25/2023   BILITOT 0.9 03/25/2023       Danelle Earthly, MD [facility] 03/28/2023, 2:47 PM

## 2023-03-29 DIAGNOSIS — Z22358 Carrier of other enterobacterales: Secondary | ICD-10-CM

## 2023-03-29 DIAGNOSIS — N390 Urinary tract infection, site not specified: Secondary | ICD-10-CM | POA: Diagnosis not present

## 2023-03-29 DIAGNOSIS — I2510 Atherosclerotic heart disease of native coronary artery without angina pectoris: Secondary | ICD-10-CM | POA: Insufficient documentation

## 2023-03-29 DIAGNOSIS — G20A1 Parkinson's disease without dyskinesia, without mention of fluctuations: Secondary | ICD-10-CM

## 2023-03-29 DIAGNOSIS — N952 Postmenopausal atrophic vaginitis: Secondary | ICD-10-CM | POA: Diagnosis present

## 2023-03-29 LAB — CBC
HCT: 34.9 % — ABNORMAL LOW (ref 36.0–46.0)
Hemoglobin: 11.2 g/dL — ABNORMAL LOW (ref 12.0–15.0)
MCH: 30.4 pg (ref 26.0–34.0)
MCHC: 32.1 g/dL (ref 30.0–36.0)
MCV: 94.8 fL (ref 80.0–100.0)
Platelets: 195 10*3/uL (ref 150–400)
RBC: 3.68 MIL/uL — ABNORMAL LOW (ref 3.87–5.11)
RDW: 13.2 % (ref 11.5–15.5)
WBC: 5.4 10*3/uL (ref 4.0–10.5)
nRBC: 0 % (ref 0.0–0.2)

## 2023-03-29 LAB — BASIC METABOLIC PANEL
Anion gap: 5 (ref 5–15)
BUN: 25 mg/dL — ABNORMAL HIGH (ref 8–23)
CO2: 26 mmol/L (ref 22–32)
Calcium: 8.8 mg/dL — ABNORMAL LOW (ref 8.9–10.3)
Chloride: 105 mmol/L (ref 98–111)
Creatinine, Ser: 0.95 mg/dL (ref 0.44–1.00)
GFR, Estimated: 56 mL/min — ABNORMAL LOW (ref >60)
Glucose, Bld: 107 mg/dL — ABNORMAL HIGH (ref 70–99)
Potassium: 4.8 mmol/L (ref 3.5–5.1)
Sodium: 136 mmol/L (ref 135–145)

## 2023-03-29 LAB — MAGNESIUM: Magnesium: 2.1 mg/dL (ref 1.7–2.4)

## 2023-03-29 MED ORDER — NITROFURANTOIN MONOHYD MACRO 100 MG PO CAPS: 100.0000 mg | ORAL_CAPSULE | Freq: Two times a day (BID) | ORAL | 0 refills | Status: AC

## 2023-03-29 NOTE — TOC Progression Note (Signed)
Transition of Care [facility]) - Progression Note    Patient Details  Name: Kimberly Scott MRN: [id number] Date of Birth: 11/09/1928  Transition of [facility] CM/SW Contact  [name], RN Phone Number: 03/29/2023, 9:59 AM  Clinical Narrative:    Spoke with Daughter [name] and se is planning to transport the patient to [facility] today at DC   Expected Discharge Plan: Skilled Nursing Facility Barriers to Discharge: Continued Medical Work up  Expected Discharge Plan and Services       Living arrangements for the past 2 months: Single Family Home                                       Social Determinants of Health (SDOH) Interventions SDOH Screenings   Food Insecurity: No Food Insecurity (03/24/2023)  Housing: Low Risk  (03/24/2023)  Transportation Needs: No Transportation Needs (03/24/2023)  Utilities: Not At Risk (03/24/2023)  Physical Activity: Unknown (07/31/2019)  Social Connections: Unknown (07/31/2019)  Tobacco Use: Low Risk  (03/24/2023)    Readmission Risk Interventions     No data to display

## 2023-03-29 NOTE — Plan of Care (Signed)

## 2023-03-29 NOTE — Discharge Summary (Signed)
Physician Discharge Summary   Patient: Kimberly Scott MRN: [id number] DOB: 11-03-1928  Admit date:     03/24/2023  Discharge date: 03/29/23  Discharge Physician: Pennie Banter   PCP: Lynnea Ferrier, MD   Recommendations at discharge:   Follow up with Primary Care in 1-2 weeks Follow up with Urology regarding ?recurrent UTI's vs. chronic colonization -- Referral requested for Dr. Apolinar Junes Repeat CBC, BMP in 1-2 weeks  Discharge Diagnoses: Principal Problem:   Falls Active Problems:   TIA (transient ischemic attack)   Parkinson's disease   Dysphagia  Resolved Problems:   * No resolved hospital problems. *  Hospital Course: "Kimberly Scott is a 89 y.o. female with medical history significant of Parkinson disease, hypertension, coronary artery disease presenting with fall, UTI, weakness, dysphagia.  History primarily from patient's daughter in the setting of dementia.  Per report, patient with worsening falls over the past 2 to 3 days.  Also with worsening weakness. "  Further hospital course and management and outlined below.  03/29/23 -- patient medically stable for discharge to rehab.   Assessment and Plan:  * Asymptomatic bacteruria  UTI, ruled out Colonization urinary tract with E. Coli, ESBL Family reported pt having dysuria, increased urinary frequency with recurrent falls which was felt typical of pt's past UTI's.  Started on ceftriaxone, which improvement in all her supposed UTI symptoms, however, urine cx returned ESBL E coli which is resistant to ceftriaxone.   --Infectious disease was consulted and felt most likely colonization --Continue Macrobid 4 more days (5 day course) --Referral to urology outpatient, Dr. Apolinar Junes   Chronic Dysuria due to Atrophic Vaginitis --Continue Intravaginal estradiol cream three times weekly  Falls Worsening falls and weakness at home in the setting of baseline Parkinson's disease and likely dehydration. --PT, SNF  rehab   Reduced oral intake Dehydration. --daughter reported pt not eating or drinking enough fluids.  BUN and Cr slightly more elevated than baseline. --Frequently encourage oral hydration   Dysphagia, ruled out Per the daughter, patient with increasing coughing/choking episodes associated with food and liquids. --SLP evaluation cleared pt for regular diet.   Parkinson's disease Acute decompensation of baseline weakness in the setting of dehydration. --continue Sinemet   Cognitive decline --Daughter reported pt has some memory loss, but pt and family denied formal dx of dementia, said pt was confused due to UTI, thought pt's mental status improved without having received sensitive abx.         Consultants: Infectious disease Procedures performed: None  Disposition: Skilled nursing facility Diet recommendation:  Regular diet DISCHARGE MEDICATION: Allergies as of 03/29/2023       Reactions   Bacitracin-neomycin-polymyxin Swelling, Rash   Blisters Blisters   Ace Inhibitors    Other reaction(s): Cough   Omega 3  [fish Oil] Other (See Comments)   Neosporin [neomycin-bacitracin Zn-polymyx] Rash   Tape Rash   Blisters        Medication List     STOP taking these medications    amoxicillin 500 MG capsule Commonly known as: AMOXIL       TAKE these medications    acetaminophen 325 MG tablet Commonly known as: TYLENOL Take 2 tablets (650 mg total) by mouth every 6 (six) hours as needed for mild pain, headache or fever.   ascorbic acid 500 MG tablet Commonly known as: VITAMIN C Take 2 tablets (1,000 mg total) by mouth daily.   aspirin EC 81 MG tablet Take 81 mg by mouth daily.  atorvastatin 40 MG tablet Commonly known as: LIPITOR Take 1 tablet (40 mg total) by mouth daily at 6 PM.   carbidopa-levodopa 25-100 MG tablet Commonly known as: SINEMET IR Take 2 tablets by mouth as directed. Take 2 tabs in the morning and 1 tablet at noon and dinner time    cholecalciferol 1000 units tablet Commonly known as: VITAMIN D Take 1,000 Units by mouth daily.   cyanocobalamin 1000 MCG/ML injection Commonly known as: VITAMIN B12 Inject 1,000 mcg into the muscle every 30 (thirty) days. Last dose 11/20/21   diclofenac Sodium 1 % Gel Commonly known as: VOLTAREN Apply 2 g topically 4 (four) times daily.   estradiol 0.1 MG/GM vaginal cream Commonly known as: ESTRACE Place 1 Applicatorful vaginally 3 (three) times a week.   feeding supplement Liqd Take 237 mLs by mouth 3 (three) times daily between meals.   meloxicam 7.5 MG tablet Commonly known as: MOBIC Take 7.5 mg by mouth daily.   multivitamin with minerals Tabs tablet Take 1 tablet by mouth daily.   nitrofurantoin (macrocrystal-monohydrate) 100 MG capsule Commonly known as: MACROBID Take 1 capsule (100 mg total) by mouth every 12 (twelve) hours for 4 days.   phenol 1.4 % Liqd Commonly known as: CHLORASEPTIC Use as directed 1 spray in the mouth or throat as needed for throat irritation / pain.   telmisartan 20 MG tablet Commonly known as: MICARDIS Take 20 mg by mouth daily.   triamcinolone ointment 0.1 % Commonly known as: KENALOG Apply 1 application topically 2 (two) times daily as needed.        Contact information for follow-up providers     Lynnea Ferrier, MD Follow up.   Specialty: Internal Medicine Why: Hospital follow up in 1-2 weeks Contact information: [address] [facility] [place] [address] [phone]         Vanna Scotland, MD. Call.   Specialty: Urology Why: Referral for bacteruria, suspect colonization, hx of recurrent UTI's and chronic dysuria. Contact information: [address] [place] [address] [phone]              Contact information for after-discharge care     Destination     [facility] Preferred SNF .    Service: Skilled Nursing Contact information: [address] [place] [address] [phone]                    Discharge Exam: Ceasar Mons Weights   03/24/23 1217  Weight: 70.8 kg   General exam: awake, alert, no acute distress HEENT: atraumatic, clear conjunctiva, anicteric sclera, moist mucus membranes, hearing grossly normal  Respiratory system: CTA, no wheezes, rales or rhonchi, normal respiratory effort. Cardiovascular system: normal S1/S2, RRR, mild nonpitting BLE edema.   Gastrointestinal system: soft, NT, ND, no HSM felt, +bowel sounds. Central nervous system: A&O x self and place, situation. no gross focal neurologic deficits, normal speech Extremities: moves all, no edema, normal tone Skin: dry, intact, normal temperature Psychiatry: normal mood, congruent affect, judgement and insight appear normal   Condition at discharge: stable  The results of significant diagnostics from this hospitalization (including imaging, microbiology, ancillary and laboratory) are listed below for reference.   Imaging Studies: CT HEAD WO CONTRAST ( )  Result Date: 03/24/2023 CLINICAL DATA:  Head trauma, minor. EXAM: CT HEAD WITHOUT CONTRAST TECHNIQUE: Contiguous axial images were obtained from the base of the skull through  the vertex without intravenous contrast. RADIATION DOSE REDUCTION: This exam was performed according to the departmental dose-optimization program which includes automated exposure control, adjustment of the mA and/or kV according to patient size and/or use of iterative reconstruction technique. COMPARISON:  Head CT 02/16/2023. FINDINGS: Brain: No acute hemorrhage. Unchanged severe chronic small-vessel disease. Cortical gray-white differentiation is otherwise preserved. Prominence of the ventricles and sulci within expected range for age. No hydrocephalus or extra-axial collection. No mass effect or midline shift. Vascular: No hyperdense vessel or  unexpected calcification. Skull: No calvarial fracture or suspicious bone lesion. Skull base is unremarkable. Sinuses/Orbits: Unremarkable. Other: None. IMPRESSION: 1. No acute intracranial hemorrhage. 2. Unchanged severe chronic small-vessel disease. Electronically Signed   By: Orvan Falconer M.D.   On: 03/24/2023 12:59   DG Pelvis Portable  Result Date: 03/24/2023 CLINICAL DATA:  Coccygeal pain after a fall EXAM: PORTABLE PELVIS 1-2 VIEWS COMPARISON:  None Available. FINDINGS: There is no acute fracture or dislocation in the pelvis; however, note that the sacrum and coccyx are not well assessed. Femoroacetabular alignment is normal. The joint spaces are preserved. The SI joints and symphysis pubis are intact. The soft tissues are unremarkable. IMPRESSION: No evidence of acute fracture or dislocation in the pelvis; however, note that the sacrum and coccyx are not well assessed. Electronically Signed   By: Lesia Hausen M.D.   On: 03/24/2023 08:21    Microbiology: Results for orders placed or performed during the hospital encounter of 03/24/23  Urine Culture (for pregnant, neutropenic or urologic patients or patients with an indwelling urinary catheter)     Status: Abnormal   Collection Time: 03/24/23  8:33 AM   Specimen: Urine, Clean Catch  Result Value Ref Range Status   Specimen Description   Final    URINE, CLEAN CATCH Performed at [facility], [address], [place], [place] [address]    Special Requests   Final    NONE Performed at [facility], [address] [place], [place] [address]    Culture (A)  Final    90,000 COLONIES/mL ESCHERICHIA COLI Confirmed Extended Spectrum Beta-Lactamase Producer (ESBL).  In bloodstream infections from ESBL organisms, carbapenems are preferred over piperacillin/tazobactam. They are shown to have a lower risk of mortality.    Report Status 03/27/2023 FINAL  Final   Organism ID, Bacteria ESCHERICHIA COLI (A)  Final       Susceptibility   Escherichia coli - MIC*    AMPICILLIN >=32 RESISTANT Resistant     CEFAZOLIN >=64 RESISTANT Resistant     CEFEPIME 4 INTERMEDIATE Intermediate     CEFTRIAXONE >=64 RESISTANT Resistant     CIPROFLOXACIN 0.5 INTERMEDIATE Intermediate     GENTAMICIN >=16 RESISTANT Resistant     IMIPENEM 0.5 SENSITIVE Sensitive     NITROFURANTOIN <=16 SENSITIVE Sensitive     TRIMETH/SULFA >=320 RESISTANT Resistant     AMPICILLIN/SULBACTAM >=32 RESISTANT Resistant     PIP/TAZO <=4 SENSITIVE Sensitive     * 90,000 COLONIES/mL ESCHERICHIA COLI    Labs: CBC: Recent Labs  Lab 03/24/23 0833 03/25/23 0617 03/29/23 0624  WBC 6.7 6.5 5.4  HGB 11.9* 10.8* 11.2*  HCT 36.5 33.2* 34.9*  MCV 95.3 95.7 94.8  PLT 218 201 195   Basic Metabolic Panel: Recent Labs  Lab 03/24/23 0833 03/25/23 0617 03/29/23 0624  NA 137 136 136  K 4.2 3.8 4.8  CL 105 109 105  CO2 24 21* 26  GLUCOSE 100* 89 107*  BUN 26* 18 25*  CREATININE 1.05* 0.98 0.95  CALCIUM 9.2 8.3* 8.8*  MG  --   --  2.1   Liver Function Tests: Recent Labs  Lab 03/24/23 0833 03/25/23 0617  AST 33 27  ALT 11 11  ALKPHOS 58 53  BILITOT 1.0 0.9  PROT 7.3 5.9*  ALBUMIN 3.7 3.0*   CBG: No results for input(s): "GLUCAP" in the last 168 hours.  Discharge time spent: greater than 30 minutes.  Signed: Pennie Banter, DO [facility] 03/29/2023

## 2023-05-03 ENCOUNTER — Ambulatory Visit (INDEPENDENT_AMBULATORY_CARE_PROVIDER_SITE_OTHER): Payer: Medicare Other | Admitting: Urology

## 2023-05-03 VITALS — BP 170/61 | HR 72

## 2023-05-03 DIAGNOSIS — Z09 Encounter for follow-up examination after completed treatment for conditions other than malignant neoplasm: Secondary | ICD-10-CM | POA: Diagnosis not present

## 2023-05-03 DIAGNOSIS — Z87440 Personal history of urinary (tract) infections: Secondary | ICD-10-CM

## 2023-05-03 DIAGNOSIS — N390 Urinary tract infection, site not specified: Secondary | ICD-10-CM

## 2023-05-03 LAB — BLADDER SCAN AMB NON-IMAGING: Scan Result: 82

## 2023-05-03 NOTE — Progress Notes (Signed)
Kimberly Scott,acting as a scribe for Kimberly Scotland, MD.,have documented all relevant documentation on the behalf of Kimberly Scotland, MD,as directed by  Kimberly Scotland, MD while in the presence of Kimberly Scotland, MD.  05/03/2023 4:46 PM   Kimberly Scott 12-01-1927 [id number]  Referring provider: Pennie Banter, DO [address] [place],  [place] [address]  Chief Complaint  Patient presents with   Establish Care    HPI: 89 year-old female who is referred for recurrent UTI's. She has a personal history of Parkinson's, hypertension, and coronary artery disease. She was admitted to the hospital with falls in late 02/2023. She was known to have asymptomatic bacteruria at the time.  ID was consulted and a 5 day course of Macrobid was recommended and a referral to urology. She is already using vaginal estrogen cream 3 times a week. Her urinalysis on that admission showed a few WBC's. A urine culture grew 90,000 colonies of ESBL E. Coli. All of her other urinalysis appear fairly bland. There are often greater than five epithelial cells at her specimen. She did have one urinalysis in December that looked fairly suspicious. There was not an associated urine culture. She saw Kimberly Scott for essentially the same issue in 04/2022. At that time, a culture also grew ESBL E. Coli.   She does not have any recent cross sectional imaging.   Notably, she did have another admission with COVID, but also incidentally had the same ESBL E. Coli. ID recommended avoiding prophylactic antibiotics and felt that she was most likely just colonized. They recommended that she follow up with [place].   Results for orders placed or performed in visit on 05/03/23  Bladder Scan (Post Void Residual) in office  Result Value Ref Range   Scan Result 82 ml       PMH: Past Medical History:  Diagnosis Date   Anginal pain (HCC)    Arthritis    Cancer (HCC)    FACIAL SKIN CANCER   Coronary artery disease     Headache(784.0)    Heart murmur    Hypertension    Pneumonia    hx of PNA    Surgical History: Past Surgical History:  Procedure Laterality Date   ABDOMINAL HYSTERECTOMY     APPENDECTOMY     cataracts     CORONARY ARTERY BYPASS GRAFT  10/01/2012   Procedure: CORONARY ARTERY BYPASS GRAFTING (CABG);  Surgeon: Delight Ovens, MD;  Location[facility] OR;  Service: Open Heart Surgery;  Laterality: N/A;  times five using Left Internal Mammary Artery    ENDOVEIN HARVEST OF GREATER SAPHENOUS VEIN  10/01/2012   Procedure: ENDOVEIN HARVEST OF GREATER SAPHENOUS VEIN;  Surgeon: Delight Ovens, MD;  Location: [facility] OR;  Service: Open Heart Surgery;  Laterality: Bilateral;   ESOPHAGOGASTRODUODENOSCOPY N/A 06/30/2021   Procedure: ESOPHAGOGASTRODUODENOSCOPY (EGD);  Surgeon: Toledo, Boykin Nearing, MD;  Location: [facility] ENDOSCOPY;  Service: Gastroenterology;  Laterality: N/A;   KNEE SURGERY     TONSILLECTOMY     TOTAL KNEE ARTHROPLASTY     right knee    Home Medications:  Allergies as of 05/03/2023       Reactions   Bacitracin-neomycin-polymyxin Swelling, Rash   Blisters Blisters   Ace Inhibitors    Other reaction(s): Cough   Omega 3  [fish Oil] Other (See Comments)   Neosporin [neomycin-bacitracin Zn-polymyx] Rash   Tape Rash   Blisters        Medication List  Accurate as of May 03, 2023  4:46 PM. If you have any questions, ask your nurse or doctor.          acetaminophen 325 MG tablet Commonly known as: TYLENOL Take 2 tablets (650 mg total) by mouth every 6 (six) hours as needed for mild pain, headache or fever.   ascorbic acid 500 MG tablet Commonly known as: VITAMIN C Take 2 tablets (1,000 mg total) by mouth daily.   aspirin EC 81 MG tablet Take 81 mg by mouth daily.   atorvastatin 40 MG tablet Commonly known as: LIPITOR Take 1 tablet (40 mg total) by mouth daily at 6 PM.   carbidopa-levodopa 25-100 MG tablet Commonly known as: SINEMET IR Take 2 tablets by mouth as  directed. Take 2 tabs in the morning and 1 tablet at noon and dinner time   cholecalciferol 1000 units tablet Commonly known as: VITAMIN D Take 1,000 Units by mouth daily.   cyanocobalamin 1000 MCG/ML injection Commonly known as: VITAMIN B12 Inject 1,000 mcg into the muscle every 30 (thirty) days. Last dose 11/20/21   diclofenac Sodium 1 % Gel Commonly known as: VOLTAREN Apply 2 g topically 4 (four) times daily.   estradiol 0.1 MG/GM vaginal cream Commonly known as: ESTRACE Place 1 Applicatorful vaginally 3 (three) times a week.   feeding supplement Liqd Take 237 mLs by mouth 3 (three) times daily between meals.   meloxicam 7.5 MG tablet Commonly known as: MOBIC Take by mouth.   multivitamin with minerals Tabs tablet Take 1 tablet by mouth daily.   phenol 1.4 % Liqd Commonly known as: CHLORASEPTIC Use as directed 1 spray in the mouth or throat as needed for throat irritation / pain.   telmisartan 20 MG tablet Commonly known as: MICARDIS Take 20 mg by mouth daily.   triamcinolone ointment 0.1 % Commonly known as: KENALOG Apply 1 application topically 2 (two) times daily as needed.        Allergies:  Allergies  Allergen Reactions   Bacitracin-Neomycin-Polymyxin Swelling and Rash    Blisters Blisters    Ace Inhibitors     Other reaction(s): Cough   Omega 3  [Fish Oil] Other (See Comments)   Neosporin [Neomycin-Bacitracin Zn-Polymyx] Rash   Tape Rash    Blisters    Social History:  reports that she has never smoked. She has never used smokeless tobacco. She reports that she does not drink alcohol and does not use drugs.   Physical Exam: BP (!) 170/61   Pulse 72   Constitutional:  Alert and oriented, No acute distress. HEENT: [place] AT, moist mucus membranes.  Trachea midline, no masses. Neurologic: Grossly intact, no focal deficits, moving all 4 extremities. Psychiatric: Normal mood and affect.   Assessment & Plan:    1. Recurrent UTI's - Continue  vaginal estrogen cream three times a week, ensuring application around the urethral area. - Start cranberry tablets, probiotics, and D-mannose daily. - Increase water intake to at least four tall glasses daily. - Avoid douching. - Address constipation with Miralax as needed. - Consider catheterized urine sample collection for future suspected infections to avoid contamination. - Monitor for signs of true infection and contact the office if symptoms arise. - Advised against cystoscopy due to low yield and potential risks.  Return if symptoms worsen or fail to improve.  [facility] [address] [place], [place] [address] [phone]

## 2023-05-03 NOTE — Patient Instructions (Signed)
For UTI prevention take cranberry tablets, probiotic, D-Mannose daily.  

## 2023-05-04 LAB — URINALYSIS, COMPLETE
Bilirubin, UA: NEGATIVE
Glucose, UA: NEGATIVE
Ketones, UA: NEGATIVE
Nitrite, UA: NEGATIVE
Protein,UA: NEGATIVE
RBC, UA: NEGATIVE
Specific Gravity, UA: 1.01 (ref 1.005–1.030)
Urobilinogen, Ur: 0.2 mg/dL (ref 0.2–1.0)
pH, UA: 5.5 (ref 5.0–7.5)

## 2023-05-04 LAB — MICROSCOPIC EXAMINATION

## 2023-05-06 LAB — CULTURE, URINE COMPREHENSIVE

## 2024-03-09 ENCOUNTER — Emergency Department

## 2024-03-09 ENCOUNTER — Other Ambulatory Visit: Payer: Self-pay

## 2024-03-09 ENCOUNTER — Inpatient Hospital Stay
Admission: EM | Admit: 2024-03-09 | Discharge: 2024-03-14 | DRG: 690 | Disposition: A | Discharge status: Skilled Nursing Facility | Attending: Internal Medicine | Admitting: Internal Medicine

## 2024-03-09 DIAGNOSIS — Z6830 Body mass index (BMI) 30.0-30.9, adult: Secondary | ICD-10-CM

## 2024-03-09 DIAGNOSIS — I2510 Atherosclerotic heart disease of native coronary artery without angina pectoris: Secondary | ICD-10-CM | POA: Diagnosis present

## 2024-03-09 DIAGNOSIS — N3000 Acute cystitis without hematuria: Secondary | ICD-10-CM | POA: Diagnosis not present

## 2024-03-09 DIAGNOSIS — I10 Essential (primary) hypertension: Secondary | ICD-10-CM | POA: Diagnosis present

## 2024-03-09 DIAGNOSIS — R600 Localized edema: Secondary | ICD-10-CM | POA: Diagnosis present

## 2024-03-09 DIAGNOSIS — Z951 Presence of aortocoronary bypass graft: Secondary | ICD-10-CM

## 2024-03-09 DIAGNOSIS — Z79890 Hormone replacement therapy (postmenopausal): Secondary | ICD-10-CM

## 2024-03-09 DIAGNOSIS — R531 Weakness: Secondary | ICD-10-CM | POA: Diagnosis not present

## 2024-03-09 DIAGNOSIS — I129 Hypertensive chronic kidney disease with stage 1 through stage 4 chronic kidney disease, or unspecified chronic kidney disease: Secondary | ICD-10-CM | POA: Diagnosis present

## 2024-03-09 DIAGNOSIS — Z7982 Long term (current) use of aspirin: Secondary | ICD-10-CM

## 2024-03-09 DIAGNOSIS — Z888 Allergy status to other drugs, medicaments and biological substances status: Secondary | ICD-10-CM

## 2024-03-09 DIAGNOSIS — E785 Hyperlipidemia, unspecified: Secondary | ICD-10-CM | POA: Diagnosis present

## 2024-03-09 DIAGNOSIS — Z85828 Personal history of other malignant neoplasm of skin: Secondary | ICD-10-CM

## 2024-03-09 DIAGNOSIS — Z9109 Other allergy status, other than to drugs and biological substances: Secondary | ICD-10-CM

## 2024-03-09 DIAGNOSIS — N179 Acute kidney failure, unspecified: Secondary | ICD-10-CM | POA: Diagnosis not present

## 2024-03-09 DIAGNOSIS — Z8701 Personal history of pneumonia (recurrent): Secondary | ICD-10-CM

## 2024-03-09 DIAGNOSIS — Z22359 Carrier of enterobacterales, unspecified: Secondary | ICD-10-CM

## 2024-03-09 DIAGNOSIS — W1830XA Fall on same level, unspecified, initial encounter: Secondary | ICD-10-CM | POA: Diagnosis present

## 2024-03-09 DIAGNOSIS — N390 Urinary tract infection, site not specified: Secondary | ICD-10-CM | POA: Diagnosis present

## 2024-03-09 DIAGNOSIS — B9620 Unspecified Escherichia coli [E. coli] as the cause of diseases classified elsewhere: Secondary | ICD-10-CM | POA: Diagnosis present

## 2024-03-09 DIAGNOSIS — E871 Hypo-osmolality and hyponatremia: Secondary | ICD-10-CM | POA: Diagnosis present

## 2024-03-09 DIAGNOSIS — Z22358 Carrier of other enterobacterales: Secondary | ICD-10-CM

## 2024-03-09 DIAGNOSIS — R7989 Other specified abnormal findings of blood chemistry: Secondary | ICD-10-CM | POA: Diagnosis present

## 2024-03-09 DIAGNOSIS — E875 Hyperkalemia: Secondary | ICD-10-CM | POA: Diagnosis present

## 2024-03-09 DIAGNOSIS — E860 Dehydration: Secondary | ICD-10-CM | POA: Diagnosis present

## 2024-03-09 DIAGNOSIS — E66811 Obesity, class 1: Secondary | ICD-10-CM | POA: Diagnosis present

## 2024-03-09 DIAGNOSIS — F02A0 Dementia in other diseases classified elsewhere, mild, without behavioral disturbance, psychotic disturbance, mood disturbance, and anxiety: Secondary | ICD-10-CM | POA: Diagnosis present

## 2024-03-09 DIAGNOSIS — Z8619 Personal history of other infectious and parasitic diseases: Secondary | ICD-10-CM

## 2024-03-09 DIAGNOSIS — G309 Alzheimer's disease, unspecified: Secondary | ICD-10-CM | POA: Diagnosis present

## 2024-03-09 DIAGNOSIS — G20A1 Parkinson's disease without dyskinesia, without mention of fluctuations: Secondary | ICD-10-CM | POA: Diagnosis not present

## 2024-03-09 DIAGNOSIS — Z881 Allergy status to other antibiotic agents status: Secondary | ICD-10-CM

## 2024-03-09 DIAGNOSIS — M1990 Unspecified osteoarthritis, unspecified site: Secondary | ICD-10-CM | POA: Diagnosis present

## 2024-03-09 DIAGNOSIS — N1832 Chronic kidney disease, stage 3b: Secondary | ICD-10-CM | POA: Diagnosis present

## 2024-03-09 DIAGNOSIS — F0280 Dementia in other diseases classified elsewhere without behavioral disturbance: Secondary | ICD-10-CM

## 2024-03-09 DIAGNOSIS — Z1623 Resistance to quinolones and fluoroquinolones: Secondary | ICD-10-CM | POA: Diagnosis present

## 2024-03-09 DIAGNOSIS — R7881 Bacteremia: Secondary | ICD-10-CM | POA: Diagnosis present

## 2024-03-09 DIAGNOSIS — R011 Cardiac murmur, unspecified: Secondary | ICD-10-CM | POA: Diagnosis present

## 2024-03-09 DIAGNOSIS — Z8673 Personal history of transient ischemic attack (TIA), and cerebral infarction without residual deficits: Secondary | ICD-10-CM

## 2024-03-09 DIAGNOSIS — G459 Transient cerebral ischemic attack, unspecified: Secondary | ICD-10-CM | POA: Diagnosis present

## 2024-03-09 DIAGNOSIS — Z79899 Other long term (current) drug therapy: Secondary | ICD-10-CM

## 2024-03-09 DIAGNOSIS — Z96651 Presence of right artificial knee joint: Secondary | ICD-10-CM | POA: Diagnosis present

## 2024-03-09 DIAGNOSIS — W19XXXA Unspecified fall, initial encounter: Principal | ICD-10-CM

## 2024-03-09 DIAGNOSIS — Z90710 Acquired absence of both cervix and uterus: Secondary | ICD-10-CM

## 2024-03-09 DIAGNOSIS — Z791 Long term (current) use of non-steroidal anti-inflammatories (NSAID): Secondary | ICD-10-CM

## 2024-03-09 DIAGNOSIS — Z1612 Extended spectrum beta lactamase (ESBL) resistance: Secondary | ICD-10-CM | POA: Diagnosis present

## 2024-03-09 DIAGNOSIS — R296 Repeated falls: Secondary | ICD-10-CM | POA: Diagnosis present

## 2024-03-09 DIAGNOSIS — R630 Anorexia: Secondary | ICD-10-CM | POA: Diagnosis present

## 2024-03-09 DIAGNOSIS — R1310 Dysphagia, unspecified: Secondary | ICD-10-CM | POA: Diagnosis present

## 2024-03-09 DIAGNOSIS — D631 Anemia in chronic kidney disease: Secondary | ICD-10-CM | POA: Diagnosis present

## 2024-03-09 LAB — CBC
HCT: 35 % — ABNORMAL LOW (ref 36.0–46.0)
Hemoglobin: 11.6 g/dL — ABNORMAL LOW (ref 12.0–15.0)
MCH: 31.9 pg (ref 26.0–34.0)
MCHC: 33.1 g/dL (ref 30.0–36.0)
MCV: 96.2 fL (ref 80.0–100.0)
Platelets: 228 10*3/uL (ref 150–400)
RBC: 3.64 MIL/uL — ABNORMAL LOW (ref 3.87–5.11)
RDW: 13.2 % (ref 11.5–15.5)
WBC: 9.5 10*3/uL (ref 4.0–10.5)
nRBC: 0 % (ref 0.0–0.2)

## 2024-03-09 LAB — COMPREHENSIVE METABOLIC PANEL WITH GFR
ALT: 11 U/L (ref 0–44)
AST: 25 U/L (ref 15–41)
Albumin: 3.9 g/dL (ref 3.5–5.0)
Alkaline Phosphatase: 50 U/L (ref 38–126)
Anion gap: 9 (ref 5–15)
BUN: 35 mg/dL — ABNORMAL HIGH (ref 8–23)
CO2: 23 mmol/L (ref 22–32)
Calcium: 9.4 mg/dL (ref 8.9–10.3)
Chloride: 104 mmol/L (ref 98–111)
Creatinine, Ser: 1.39 mg/dL — ABNORMAL HIGH (ref 0.44–1.00)
GFR, Estimated: 35 mL/min — ABNORMAL LOW (ref >60)
Glucose, Bld: 104 mg/dL — ABNORMAL HIGH (ref 70–99)
Potassium: 4.7 mmol/L (ref 3.5–5.1)
Sodium: 136 mmol/L (ref 135–145)
Total Bilirubin: 1.2 mg/dL (ref 0.0–1.2)
Total Protein: 7.3 g/dL (ref 6.5–8.1)

## 2024-03-09 LAB — CK: Total CK: 59 U/L (ref 38–234)

## 2024-03-09 LAB — URINALYSIS, W/ REFLEX TO CULTURE (INFECTION SUSPECTED)
Bilirubin Urine: NEGATIVE
Glucose, UA: NEGATIVE mg/dL
Hgb urine dipstick: NEGATIVE
Ketones, ur: NEGATIVE mg/dL
Nitrite: POSITIVE — AB
Protein, ur: NEGATIVE mg/dL
Specific Gravity, Urine: 1.011 (ref 1.005–1.030)
Squamous Epithelial / HPF: 0 /HPF (ref 0–5)
pH: 5 (ref 5.0–8.0)

## 2024-03-09 LAB — LACTIC ACID, PLASMA
Lactic Acid, Venous: 0.9 mmol/L (ref 0.5–1.9)
Lactic Acid, Venous: 1.6 mmol/L (ref 0.5–1.9)

## 2024-03-09 LAB — TROPONIN I (HIGH SENSITIVITY)
Troponin I (High Sensitivity): 28 ng/L — ABNORMAL HIGH (ref <18)
Troponin I (High Sensitivity): 29 ng/L — ABNORMAL HIGH (ref <18)

## 2024-03-09 MED ORDER — POLYETHYLENE GLYCOL 3350 17 G PO PACK: 17.0000 g | PACK | Freq: Every day | ORAL | Status: DC | PRN

## 2024-03-09 MED ORDER — SODIUM CHLORIDE 0.9 % IV SOLN
1.0000 g | Freq: Two times a day (BID) | INTRAVENOUS | Status: AC
  Administered 2024-03-10 – 2024-03-12 (×6): 1 g via INTRAVENOUS
  Filled 2024-03-09 (×6): qty 20

## 2024-03-09 MED ORDER — SODIUM CHLORIDE 0.9 % IV SOLN: 2.0000 g | Freq: Once | INTRAVENOUS | Status: DC

## 2024-03-09 MED ORDER — ASPIRIN 81 MG PO TBEC
81.0000 mg | DELAYED_RELEASE_TABLET | Freq: Every day | ORAL | Status: DC
  Administered 2024-03-10 – 2024-03-14 (×5): 81 mg via ORAL
  Filled 2024-03-09 (×5): qty 1

## 2024-03-09 MED ORDER — ACETAMINOPHEN 325 MG PO TABS
650.0000 mg | ORAL_TABLET | Freq: Four times a day (QID) | ORAL | Status: DC | PRN
  Administered 2024-03-10: 650 mg via ORAL
  Filled 2024-03-09: qty 2

## 2024-03-09 MED ORDER — ACETAMINOPHEN 650 MG RE SUPP: 650.0000 mg | Freq: Four times a day (QID) | RECTAL | Status: DC | PRN

## 2024-03-09 MED ORDER — ONDANSETRON HCL 4 MG PO TABS: 4.0000 mg | ORAL_TABLET | Freq: Four times a day (QID) | ORAL | Status: DC | PRN

## 2024-03-09 MED ORDER — ACETAMINOPHEN 500 MG PO TABS
1000.0000 mg | ORAL_TABLET | Freq: Once | ORAL | Status: AC
  Administered 2024-03-09: 1000 mg via ORAL
  Filled 2024-03-09: qty 2

## 2024-03-09 MED ORDER — SODIUM CHLORIDE 0.9 % IV SOLN
1.0000 g | Freq: Once | INTRAVENOUS | Status: AC
  Administered 2024-03-09: 1 g via INTRAVENOUS
  Filled 2024-03-09: qty 20

## 2024-03-09 MED ORDER — VITAMIN C 500 MG PO TABS
1000.0000 mg | ORAL_TABLET | Freq: Every day | ORAL | Status: DC
  Administered 2024-03-10 – 2024-03-14 (×5): 1000 mg via ORAL
  Filled 2024-03-09 (×5): qty 2

## 2024-03-09 MED ORDER — LACTATED RINGERS IV SOLN
INTRAVENOUS | Status: AC
  Administered 2024-03-10: 100 mL/h via INTRAVENOUS

## 2024-03-09 MED ORDER — ENOXAPARIN SODIUM 30 MG/0.3ML IJ SOSY
30.0000 mg | PREFILLED_SYRINGE | INTRAMUSCULAR | Status: DC
  Administered 2024-03-09 – 2024-03-13 (×5): 30 mg via SUBCUTANEOUS
  Filled 2024-03-09 (×5): qty 0.3

## 2024-03-09 MED ORDER — VITAMIN D 25 MCG (1000 UNIT) PO TABS
1000.0000 [IU] | ORAL_TABLET | Freq: Every day | ORAL | Status: DC
Start: 2024-03-10 — End: 2024-03-14
  Administered 2024-03-10 – 2024-03-14 (×5): 1000 [IU] via ORAL
  Filled 2024-03-09 (×5): qty 1

## 2024-03-09 MED ORDER — CARBIDOPA-LEVODOPA 25-100 MG PO TABS: 2.0000 | ORAL_TABLET | ORAL | Status: DC

## 2024-03-09 MED ORDER — ADULT MULTIVITAMIN W/MINERALS CH
1.0000 | ORAL_TABLET | Freq: Every day | ORAL | Status: DC
  Administered 2024-03-09 – 2024-03-14 (×6): 1 via ORAL
  Filled 2024-03-09 (×6): qty 1

## 2024-03-09 MED ORDER — LACTATED RINGERS IV SOLN: INTRAVENOUS | Status: DC

## 2024-03-09 MED ORDER — SODIUM CHLORIDE 0.9% FLUSH
3.0000 mL | Freq: Two times a day (BID) | INTRAVENOUS | Status: DC
  Administered 2024-03-09 – 2024-03-14 (×7): 3 mL via INTRAVENOUS

## 2024-03-09 MED ORDER — CARBIDOPA-LEVODOPA 25-100 MG PO TABS
1.0000 | ORAL_TABLET | ORAL | Status: DC
  Administered 2024-03-10 – 2024-03-14 (×9): 1 via ORAL
  Filled 2024-03-09 (×10): qty 1

## 2024-03-09 MED ORDER — ONDANSETRON HCL 4 MG/2ML IJ SOLN: 4.0000 mg | Freq: Four times a day (QID) | INTRAMUSCULAR | Status: DC | PRN

## 2024-03-09 MED ORDER — ENSURE ENLIVE PO LIQD
237.0000 mL | Freq: Three times a day (TID) | ORAL | Status: DC
  Administered 2024-03-09 – 2024-03-14 (×15): 237 mL via ORAL

## 2024-03-09 MED ORDER — SODIUM CHLORIDE 0.9 % IV BOLUS
1000.0000 mL | Freq: Once | INTRAVENOUS | Status: AC
Start: 2024-03-09 — End: 2024-03-09
  Administered 2024-03-09: 1000 mL via INTRAVENOUS

## 2024-03-09 MED ORDER — ATORVASTATIN CALCIUM 20 MG PO TABS
40.0000 mg | ORAL_TABLET | Freq: Every day | ORAL | Status: DC
  Administered 2024-03-10 – 2024-03-13 (×4): 40 mg via ORAL
  Filled 2024-03-09 (×4): qty 2

## 2024-03-09 MED ORDER — CARBIDOPA-LEVODOPA 25-100 MG PO TABS
2.0000 | ORAL_TABLET | Freq: Every day | ORAL | Status: DC
  Administered 2024-03-10 – 2024-03-14 (×5): 2 via ORAL
  Filled 2024-03-09 (×6): qty 2

## 2024-03-09 NOTE — Assessment & Plan Note (Signed)
 -  Continue home regimen

## 2024-03-09 NOTE — Assessment & Plan Note (Signed)
 Increasing falls in the setting of underlying progressive Parkinson's disease, now with AKI and UTI.  No injuries noted on imaging today.  - PT/OT

## 2024-03-09 NOTE — ED Triage Notes (Signed)
 Pt brought in from home by EMS for a fall. Pt states she was walking with her walker but had her cane with her. Somehow the cane came up and hit her in the left side of the head and she fell. Pt denies any dizziness or loss of consciousness. Pt is not on any blood thinners. Denies any complaints of pain.

## 2024-03-09 NOTE — Assessment & Plan Note (Addendum)
 In the setting of UTI and dehydration.  Dehydration due to diarrhea after taking MiraLAX in the last week.  Diarrhea has resolved at this time.  - S/p 1 L bolus.  Continue maintenance fluids overnight - Repeat BMP in a.m. - Hold nephrotoxic agents

## 2024-03-09 NOTE — H&P (Signed)
 History and Physical    Patient: Kimberly Scott ZOX:[id number] DOB: 1928/01/03 DOA: 03/09/2024 DOS: the patient was seen and examined on 03/09/2024 PCP: Melchor Spoon, MD  Patient coming from: Home  Chief Complaint:  Chief Complaint  Patient presents with   Fall   HPI: Kimberly Scott is a 89 y.o. female with medical history significant of Parkinson's disease, CAD s/p CABG (2013), TIA, mild Alzheimer's dementia, hypertension, hyperlipidemia, who presents to the ED due to ground-level fall.  History provided by both daughter and [name] at bedside.  [name] states that over the past few days, she has been feeling increasingly weak with a poor appetite.  Her daughter states that a few days ago, she noticed low blood pressure and called her PCP, who recommended she cut the telmisartan dose in half.  The day after doing so, blood pressure returned back to normal.  Last night, patient states that she was trying to put her cane down next to her walker when she lost her footing and somehow slipped.  The cane ended up hitting her in the head but she denies any loss of consciousness.  At this time, she denies any nausea, vomiting, abdominal pain, dysuria, hematuria, urinary frequency or urgency.  ED course: On arrival to the ED, patient was hypertensive at 151/58 with heart rate of 65.  She was saturating at 100% on room air.  She was afebrile at 98.  Initial workup notable for hemoglobin of 11.6, BUN 35, creat 1.39 and GFR 35.  Troponin 28 and then 29.  Urinalysis with small leukocytes, positive nitrites and many bacteria.  Chest x-ray, shoulder x-ray, CT head, CT C-spine, hip x-ray all with no acute abnormalities.  Given prior history of ESBL, patient started on meropenem and IV fluids.  TRH contacted for admission.  Review of Systems: As mentioned in the history of present illness. All other systems reviewed and are negative.  Past Medical History:  Diagnosis Date   Anginal  pain (HCC)    Arthritis    Cancer (HCC)    FACIAL SKIN CANCER   Coronary artery disease    Headache(784.0)    Heart murmur    Hypertension    Pneumonia    hx of PNA   Past Surgical History:  Procedure Laterality Date   ABDOMINAL HYSTERECTOMY     APPENDECTOMY     cataracts     CORONARY ARTERY BYPASS GRAFT  10/01/2012   Procedure: CORONARY ARTERY BYPASS GRAFTING (CABG);  Surgeon: Norita Beauvais, MD;  Location: [facility] OR;  Service: Open Heart Surgery;  Laterality: N/A;  times five using Left Internal Mammary Artery    ENDOVEIN HARVEST OF GREATER SAPHENOUS VEIN  10/01/2012   Procedure: ENDOVEIN HARVEST OF GREATER SAPHENOUS VEIN;  Surgeon: Norita Beauvais, MD;  Location: [facility] OR;  Service: Open Heart Surgery;  Laterality: Bilateral;   ESOPHAGOGASTRODUODENOSCOPY N/A 06/30/2021   Procedure: ESOPHAGOGASTRODUODENOSCOPY (EGD);  Surgeon: Toledo, Alphonsus Jeans, MD;  Location: [facility] ENDOSCOPY;  Service: Gastroenterology;  Laterality: N/A;   KNEE SURGERY     TONSILLECTOMY     TOTAL KNEE ARTHROPLASTY     right knee   Social History:  reports that she has never smoked. She has never used smokeless tobacco. She reports that she does not drink alcohol and does not use drugs.  Allergies  Allergen Reactions   Bacitracin-Neomycin-Polymyxin Swelling and Rash    Blisters Blisters    Ace Inhibitors     Other reaction(s): Cough  Omega 3  [Fish Oil] Other (See Comments)   Neosporin [Neomycin-Bacitracin Zn-Polymyx] Rash   Tape Rash    Blisters    History reviewed. No pertinent family history.  Prior to Admission medications   Medication Sig Start Date End Date Taking? Authorizing Provider  acetaminophen (TYLENOL) 325 MG tablet Take 2 tablets (650 mg total) by mouth every 6 (six) hours as needed for mild pain, headache or fever. 12/11/21   Montey Apa, DO  ascorbic acid (VITAMIN C) 500 MG tablet Take 2 tablets (1,000 mg total) by mouth daily. 12/11/21   Montey Apa, DO  aspirin EC 81 MG tablet  Take 81 mg by mouth daily.    [provider]  atorvastatin (LIPITOR) 40 MG tablet Take 1 tablet (40 mg total) by mouth daily at 6 PM. 05/23/18   Clelia Current, Richard, MD  carbidopa-levodopa (SINEMET IR) 25-100 MG tablet Take 2 tablets by mouth as directed. Take 2 tabs in the morning and 1 tablet at noon and dinner time    [provider]  cholecalciferol (VITAMIN D) 1000 units tablet Take 1,000 Units by mouth daily.    [provider]  cyanocobalamin (,VITAMIN B-12,) 1000 MCG/ML injection Inject 1,000 mcg into the muscle every 30 (thirty) days. Last dose 11/20/21 11/21/21   [provider]  diclofenac Sodium (VOLTAREN) 1 % GEL Apply 2 g topically 4 (four) times daily. 12/11/21   Montey Apa, DO  estradiol (ESTRACE) 0.1 MG/GM vaginal cream Place 1 Applicatorful vaginally 3 (three) times a week.    [provider]  feeding supplement (ENSURE ENLIVE / ENSURE PLUS) LIQD Take 237 mLs by mouth 3 (three) times daily between meals. 12/11/21   Montey Apa, DO  meloxicam (MOBIC) 7.5 MG tablet Take by mouth.    [provider]  Multiple Vitamin (MULTIVITAMIN WITH MINERALS) TABS tablet Take 1 tablet by mouth daily. 12/12/21   Darus Engels A, DO  phenol (CHLORASEPTIC) 1.4 % LIQD Use as directed 1 spray in the mouth or throat as needed for throat irritation / pain. 12/11/21   Darus Engels A, DO  telmisartan (MICARDIS) 20 MG tablet Take 20 mg by mouth daily.    [provider]  triamcinolone ointment (KENALOG) 0.1 % Apply 1 application topically 2 (two) times daily as needed.    [provider]    Physical Exam: Vitals:   03/09/24 1127 03/09/24 1128  BP: (!) 151/58   Pulse: 65   Resp: 17   Temp: 98 F (36.7 C)   TempSrc: Oral   SpO2: 100%   Weight:  67.2 kg  Height:  5' (1.524 m)   Physical Exam Vitals and nursing note reviewed.  Constitutional:      Appearance: She is normal weight.  HENT:     Head: Normocephalic and  atraumatic.     Mouth/Throat:     Mouth: Mucous membranes are dry.     Pharynx: Oropharynx is clear.  Eyes:     Conjunctiva/sclera: Conjunctivae normal.     Pupils: Pupils are equal, round, and reactive to light.  Cardiovascular:     Rate and Rhythm: Bradycardia present. Rhythm irregular.     Heart sounds: No murmur heard. Pulmonary:     Effort: Pulmonary effort is normal. No respiratory distress.     Breath sounds: Normal breath sounds. No wheezing, rhonchi or rales.  Abdominal:     General: Bowel sounds are normal. There is no distension.     Palpations: Abdomen  is soft.     Tenderness: There is no abdominal tenderness.  Musculoskeletal:     Right lower leg: No edema.     Left lower leg: No edema.  Skin:    General: Skin is warm and dry.  Neurological:     General: No focal deficit present.     Mental Status: She is alert. Mental status is at baseline.     Comments: Globally weak  Psychiatric:        Mood and Affect: Mood normal.        Behavior: Behavior normal.    Data Reviewed: CBC with WBC of 9.5, hemoglobin of 11.6, and platelets of 228 CMP with sodium of 136, potassium 4.7, bicarb 23, glucose 104, BUN 35, creat 1.35, AST 25, ALT 11, GFR 35 CK 59 Troponin 28 and then 29 Urinalysis with small leukocytes, positive nitrites, many bacteria  Initial EKG personally reviewed.  Sinus rhythm with first-degree AV block.  Numerous PACs.  Repeat EKG personally reviewed.  Unchanged.  Singular PVC noted.  Compared to EKG obtained in 2023, sinus rhythm has replaced accelerated junctional rhythm.  CT Head Wo Contrast Result Date: 03/09/2024 CLINICAL DATA:  Head trauma, minor (Age >= 65y); Neck trauma (Age >= 65y). Fall. EXAM: CT HEAD WITHOUT CONTRAST CT CERVICAL SPINE WITHOUT CONTRAST TECHNIQUE: Multidetector CT imaging of the head and cervical spine was performed following the standard protocol without intravenous contrast. Multiplanar CT image reconstructions of the cervical spine  were also generated. RADIATION DOSE REDUCTION: This exam was performed according to the departmental dose-optimization program which includes automated exposure control, adjustment of the mA and/or kV according to patient size and/or use of iterative reconstruction technique. COMPARISON:  CT head 03/24/2023.  MRI cervical spine 04/29/2022. FINDINGS: CT HEAD FINDINGS Brain: There is no evidence of an acute infarct, intracranial hemorrhage, mass, midline shift, or extra-axial fluid collection. Confluent cerebral white matter hypodensities are unchanged and nonspecific but compatible with severe chronic small vessel ischemic disease. Mild cerebral atrophy is considered to be within normal limits for age. Vascular: Calcified atherosclerosis at the skull base. No hyperdense vessel. Skull: No acute fracture or suspicious lesion. Sinuses/Orbits: Paranasal sinuses and mastoid air cells are clear. Bilateral cataract extraction. Other: None. CT CERVICAL SPINE FINDINGS Alignment: Unchanged trace anterolisthesis of C4 on C5 and 4 mm anterolisthesis of C5 on C6. Skull base and vertebrae: No acute fracture or destructive lesion. Soft tissues and spinal canal: No prevertebral fluid or swelling. No visible canal hematoma. Disc levels: Moderate to severe disc degeneration at C6-7 and mild to moderate degeneration at C5-6. Moderately advanced multilevel facet arthrosis, with facet ankylosis on the left at C4-5 and on the right at C5-6. Moderate right neural foraminal stenosis at C5-6 and C6-7. No evidence of high-grade spinal canal stenosis. Upper chest: No mass or consolidation in the included lung apices. Other: Prominent atherosclerotic calcification at the carotid bifurcations. IMPRESSION: 1. No evidence of acute intracranial abnormality. 2. Severe chronic small vessel ischemic disease. 3. No acute cervical spine fracture or traumatic malalignment. Electronically Signed   By: Aundra Lee M.D.   On: 03/09/2024 14:28   CT  Cervical Spine Wo Contrast Result Date: 03/09/2024 CLINICAL DATA:  Head trauma, minor (Age >= 65y); Neck trauma (Age >= 65y). Fall. EXAM: CT HEAD WITHOUT CONTRAST CT CERVICAL SPINE WITHOUT CONTRAST TECHNIQUE: Multidetector CT imaging of the head and cervical spine was performed following the standard protocol without intravenous contrast. Multiplanar CT image reconstructions of the cervical spine were also  generated. RADIATION DOSE REDUCTION: This exam was performed according to the departmental dose-optimization program which includes automated exposure control, adjustment of the mA and/or kV according to patient size and/or use of iterative reconstruction technique. COMPARISON:  CT head 03/24/2023.  MRI cervical spine 04/29/2022. FINDINGS: CT HEAD FINDINGS Brain: There is no evidence of an acute infarct, intracranial hemorrhage, mass, midline shift, or extra-axial fluid collection. Confluent cerebral white matter hypodensities are unchanged and nonspecific but compatible with severe chronic small vessel ischemic disease. Mild cerebral atrophy is considered to be within normal limits for age. Vascular: Calcified atherosclerosis at the skull base. No hyperdense vessel. Skull: No acute fracture or suspicious lesion. Sinuses/Orbits: Paranasal sinuses and mastoid air cells are clear. Bilateral cataract extraction. Other: None. CT CERVICAL SPINE FINDINGS Alignment: Unchanged trace anterolisthesis of C4 on C5 and 4 mm anterolisthesis of C5 on C6. Skull base and vertebrae: No acute fracture or destructive lesion. Soft tissues and spinal canal: No prevertebral fluid or swelling. No visible canal hematoma. Disc levels: Moderate to severe disc degeneration at C6-7 and mild to moderate degeneration at C5-6. Moderately advanced multilevel facet arthrosis, with facet ankylosis on the left at C4-5 and on the right at C5-6. Moderate right neural foraminal stenosis at C5-6 and C6-7. No evidence of high-grade spinal canal  stenosis. Upper chest: No mass or consolidation in the included lung apices. Other: Prominent atherosclerotic calcification at the carotid bifurcations. IMPRESSION: 1. No evidence of acute intracranial abnormality. 2. Severe chronic small vessel ischemic disease. 3. No acute cervical spine fracture or traumatic malalignment. Electronically Signed   By: Aundra Lee M.D.   On: 03/09/2024 14:28   DG Hip Unilat With Pelvis 2-3 Views Right Result Date: 03/09/2024 CLINICAL DATA:  Marvell Slider EXAM: DG HIP (WITH OR WITHOUT PELVIS) 2-3V RIGHT COMPARISON:  03/24/2023 FINDINGS: There is no evidence of hip fracture or dislocation. There is no evidence of arthropathy or other focal bone abnormality. Extensive femoral arterial calcifications. IMPRESSION: Negative. Electronically Signed   By: Nicoletta Barrier M.D.   On: 03/09/2024 13:18   DG Chest 1 View Result Date: 03/09/2024 CLINICAL DATA:  Marvell Slider EXAM: CHEST  1 VIEW COMPARISON:  12/07/2021 FINDINGS: Prominent perihilar interstitial markings left greater than right. No confluent airspace disease. No pneumothorax. Heart size upper limits normal. Aortic Atherosclerosis (ICD10-170.0). Post CABG. IMPRESSION: Prominent perihilar interstitial markings left greater than right. Electronically Signed   By: Nicoletta Barrier M.D.   On: 03/09/2024 13:17   DG Shoulder Right Result Date: 03/09/2024 CLINICAL DATA:  Marvell Slider EXAM: RIGHT SHOULDER - 2+ VIEW COMPARISON:  12/09/2021 FINDINGS: No fracture or dislocation.  Mild diffuse osteopenia.  Post CABG. IMPRESSION: No acute findings. Electronically Signed   By: Nicoletta Barrier M.D.   On: 03/09/2024 13:16   There are no new results to review at this time.  Assessment and Plan:  * Complicated UTI (urinary tract infection) Patient presenting with increased weakness, decreased p.o. intake and urinalysis consistent with UTI.  Patient denies any urinary symptoms, however she is a poor historian with dementia.  Similar admission last year, at which time culture  grew ESBL E. coli.  No evidence of sepsis at this time.  - Meropenem per pharmacy dosing - Urine culture  AKI (acute kidney injury) (HCC) In the setting of UTI and dehydration.  Dehydration due to diarrhea after taking MiraLAX in the last week.  Diarrhea has resolved at this time.  - S/p 1 L bolus.  Continue maintenance fluids overnight - Repeat BMP in a.m. -  Hold nephrotoxic agents  Falls Increasing falls in the setting of underlying progressive Parkinson's disease, now with AKI and UTI.  No injuries noted on imaging today.  - PT/OT  Parkinson's disease (HCC) Worsening weakness in the setting of AKI and UTI.  Per neurology's most recent office note, patient's symptoms have progressed overall with worsening weakness and dysphagia  - Continue home regimen  Alzheimer dementia [facility]) Patient has a history of Alzheimer's dementia, but is able to live independently.  Her daughter manages her medications and financial.  Worsened memory today, likely due to acute infection.  - Delirium precautions  TIA (transient ischemic attack) - Continue home aspirin and statin  Hypertension - Continue home regimen  Coronary arteriosclerosis Minimally elevated troponin today in the absence of chest pain, likely due to dehydration and UTI.  - Continue home aspirin and statin  Advance Care Planning:   Code Status: Full Code Long discussion with patient and her daughter at bedside regarding implications of CPR, and described how difficult recovery is.  Described that majority of patients go on life support, and it is not a guarantee that she would be able to come off life support.  Expressed that given her advanced age and comorbidities, she would have a very minimal chance of recovery. Mrs. Hechavarria adamant that she would like to remain a full code, and her daughter would like to respect these wishes.  Consults: None  Family Communication: Patient's daughter updated at bedside  Severity of  Illness: The appropriate patient status for this patient is OBSERVATION. Observation status is judged to be reasonable and necessary in order to provide the required intensity of service to ensure the patient's safety. The patient's presenting symptoms, physical exam findings, and initial radiographic and laboratory data in the context of their medical condition is felt to place them at decreased risk for further clinical deterioration. Furthermore, it is anticipated that the patient will be medically stable for discharge from the hospital within 2 midnights of admission.   Author: Avi Body, MD 03/09/2024 5:20 PM  For on call review [email or web address].

## 2024-03-09 NOTE — Sepsis Progress Note (Signed)
 Elink following code sepsis  Code Sepsis monitoring discontinued due to cancellation.  @ [phone]

## 2024-03-09 NOTE — Assessment & Plan Note (Signed)
 Worsening weakness in the setting of AKI and UTI.  Per neurology's most recent office note, patient's symptoms have progressed overall with worsening weakness and dysphagia  - Continue home regimen

## 2024-03-09 NOTE — ED Provider Notes (Signed)
 [facility] Provider Note    Event Date/Time   First MD Initiated Contact with Patient 03/09/24 1126     (approximate)   History   Fall   HPI  Kimberly Scott is a 89 y.o. female past medical history significant for Parkinson's disease, hypertension, CAD, history of ESBL, who presents to the emergency department with weakness.  History is provided by the patient's daughter who is at bedside.  States that she has had a fall last night, called today and was not getting a hold of her so finally able to get a hold of her and said that she was on the ground.  No prolonged downtime.  Patient does not recall the events of the fall.  Mild pain to the right side.  Patient's daughter states that she has had difficulty ambulating and has been weak and we are unable to stand her at home.  Normally lives by herself and ambulates with a walker without any issues.  States that last week she did have 1 episode of low blood pressure was reading 90 systolic, she stayed the night with her that night and states that the blood pressure had improved since that time.  Did have multiple episodes of diarrhea/large bowel movement after having constipation with MiraLAX last week.  Decreased p.o. intake.  Patient denies any chest pain or shortness of breath.     Physical Exam   Triage Vital Signs: ED Triage Vitals  Encounter Vitals Group     BP 03/09/24 1127 (!) 151/58     Systolic BP Percentile --      Diastolic BP Percentile --      Pulse Rate 03/09/24 1127 65     Resp 03/09/24 1127 17     Temp 03/09/24 1127 98 F (36.7 C)     Temp Source 03/09/24 1127 Oral     SpO2 03/09/24 1127 100 %     Weight 03/09/24 1128 148 lb 1.6 oz (67.2 kg)     Height 03/09/24 1128 5' (1.524 m)     Head Circumference --      Peak Flow --      Pain Score 03/09/24 1127 0     Pain Loc --      Pain Education --      Exclude from Growth Chart --     Most recent vital signs: Vitals:   03/09/24 1127   BP: (!) 151/58  Pulse: 65  Resp: 17  Temp: 98 F (36.7 C)  SpO2: 100%    Physical Exam Constitutional:      Appearance: She is well-developed.  HENT:     Head: Atraumatic.  Eyes:     Conjunctiva/sclera: Conjunctivae normal.  Cardiovascular:     Rate and Rhythm: Regular rhythm.  Pulmonary:     Effort: No respiratory distress.  Abdominal:     General: There is no distension.     Tenderness: There is no abdominal tenderness. There is no right CVA tenderness, left CVA tenderness or guarding.  Musculoskeletal:        General: Normal range of motion.     Cervical back: Normal range of motion. No tenderness.     Comments: Mild tenderness to palpation with range of motion of the right shoulder and right hip.  Skin:    General: Skin is warm.  Neurological:     Mental Status: She is alert. Mental status is at baseline.     IMPRESSION / MDM / ASSESSMENT  AND PLAN / ED COURSE  I reviewed the triage vital signs and the nursing notes.  Differential diagnosis including urinary tract infection, intracranial hemorrhage, viral illness including COVID/influenza, electrolyte abnormality, dehydration, fracture  EKG  I, Viviano Ground, the attending physician, personally viewed and interpreted this ECG.  EKG was reading as atrial fibrillation but I do see a discernible P waves.  Heart rate is 60.  Multiple PACs.  This is been a change when compared to prior EKG.  Will repeat the EKG. No tachycardic or bradycardic dysrhythmias while on cardiac telemetry.  RADIOLOGY I independently reviewed imaging, my interpretation of imaging: CT scan of the head with no findings of intracranial hemorrhage  X-ray imaging read as no acute traumatic injury.  LABS (all labs ordered are listed, but only abnormal results are displayed) Labs interpreted as -    Labs Reviewed  CBC - Abnormal; Notable for the following components:      Result Value   RBC 3.64 (*)    Hemoglobin 11.6 (*)    HCT 35.0 (*)     All other components within normal limits  COMPREHENSIVE METABOLIC PANEL WITH GFR - Abnormal; Notable for the following components:   Glucose, Bld 104 (*)    BUN 35 (*)    Creatinine, Ser 1.39 (*)    GFR, Estimated 35 (*)    All other components within normal limits  URINALYSIS, W/ REFLEX TO CULTURE (INFECTION SUSPECTED) - Abnormal; Notable for the following components:   Color, Urine YELLOW (*)    APPearance HAZY (*)    Nitrite POSITIVE (*)    Leukocytes,Ua SMALL (*)    Bacteria, UA MANY (*)    All other components within normal limits  TROPONIN I (HIGH SENSITIVITY) - Abnormal; Notable for the following components:   Troponin I (High Sensitivity) 28 (*)    All other components within normal limits  URINE CULTURE  CULTURE, BLOOD (ROUTINE X 2)  CULTURE, BLOOD (ROUTINE X 2)  CK  LACTIC ACID, PLASMA  LACTIC ACID, PLASMA  TROPONIN I (HIGH SENSITIVITY)     MDM  Given IV fluids.  No significant leukocytosis.  Creatinine with mild kidney injury from her baseline.  Does appear to be prerenal.  X-ray imaging and CT imaging without acute traumatic injury.  Findings concerning for urinary tract infection.  Likely the cause of her weakness.  On chart review patient has a history of ESBL.  Blood cultures added on and added on a lactic acid.  Discussed with pharmacy and will start the patient on meropenem given her history of ESBL.  Consulted hospitalist for admission.     PROCEDURES:  Critical Care performed: no  Procedures  Patient's presentation is most consistent with acute presentation with potential threat to life or bodily function.   MEDICATIONS ORDERED IN ED: Medications  lactated ringers infusion (has no administration in time range)  meropenem (MERREM) 1 g in sodium chloride 0.9 % 100 mL IVPB (has no administration in time range)  sodium chloride 0.9 % bolus 1,000 mL (0 mLs Intravenous Stopped 03/09/24 1420)  acetaminophen (TYLENOL) tablet 1,000 mg (1,000 mg Oral Given  03/09/24 1224)    FINAL CLINICAL IMPRESSION(S) / ED DIAGNOSES   Final diagnoses:  Fall, initial encounter  Weakness  Acute cystitis without hematuria     Rx / DC Orders   ED Discharge Orders     None        Note:  This document was prepared using Dragon voice recognition software and  may include unintentional dictation errors.   Viviano Ground, MD 03/09/24 1601

## 2024-03-09 NOTE — Assessment & Plan Note (Signed)
 Patient has a history of Alzheimer's dementia, but is able to live independently.  Her daughter manages her medications and financial.  Worsened memory today, likely due to acute infection.  - Delirium precautions

## 2024-03-09 NOTE — Assessment & Plan Note (Signed)
-   Continue home aspirin and statin

## 2024-03-09 NOTE — Assessment & Plan Note (Addendum)
 Minimally elevated troponin today in the absence of chest pain, likely due to dehydration and UTI.  - Continue home aspirin and statin

## 2024-03-09 NOTE — Assessment & Plan Note (Addendum)
 Patient presenting with increased weakness, decreased p.o. intake and urinalysis consistent with UTI.  Patient denies any urinary symptoms, however she is a poor historian with dementia.  Similar admission last year, at which time culture grew ESBL E. coli.  No evidence of sepsis at this time.  - Meropenem per pharmacy dosing - Urine culture

## 2024-03-10 ENCOUNTER — Inpatient Hospital Stay

## 2024-03-10 DIAGNOSIS — Z951 Presence of aortocoronary bypass graft: Secondary | ICD-10-CM | POA: Diagnosis not present

## 2024-03-10 DIAGNOSIS — E785 Hyperlipidemia, unspecified: Secondary | ICD-10-CM | POA: Diagnosis present

## 2024-03-10 DIAGNOSIS — F02A0 Dementia in other diseases classified elsewhere, mild, without behavioral disturbance, psychotic disturbance, mood disturbance, and anxiety: Secondary | ICD-10-CM | POA: Diagnosis present

## 2024-03-10 DIAGNOSIS — R7881 Bacteremia: Secondary | ICD-10-CM | POA: Diagnosis present

## 2024-03-10 DIAGNOSIS — N390 Urinary tract infection, site not specified: Secondary | ICD-10-CM

## 2024-03-10 DIAGNOSIS — Z96651 Presence of right artificial knee joint: Secondary | ICD-10-CM | POA: Diagnosis present

## 2024-03-10 DIAGNOSIS — Z6830 Body mass index (BMI) 30.0-30.9, adult: Secondary | ICD-10-CM | POA: Diagnosis not present

## 2024-03-10 DIAGNOSIS — B9620 Unspecified Escherichia coli [E. coli] as the cause of diseases classified elsewhere: Secondary | ICD-10-CM | POA: Diagnosis present

## 2024-03-10 DIAGNOSIS — I129 Hypertensive chronic kidney disease with stage 1 through stage 4 chronic kidney disease, or unspecified chronic kidney disease: Secondary | ICD-10-CM | POA: Diagnosis present

## 2024-03-10 DIAGNOSIS — E875 Hyperkalemia: Secondary | ICD-10-CM | POA: Diagnosis present

## 2024-03-10 DIAGNOSIS — G20A1 Parkinson's disease without dyskinesia, without mention of fluctuations: Secondary | ICD-10-CM | POA: Diagnosis present

## 2024-03-10 DIAGNOSIS — R531 Weakness: Secondary | ICD-10-CM | POA: Diagnosis present

## 2024-03-10 DIAGNOSIS — N3000 Acute cystitis without hematuria: Secondary | ICD-10-CM | POA: Diagnosis present

## 2024-03-10 DIAGNOSIS — D631 Anemia in chronic kidney disease: Secondary | ICD-10-CM | POA: Diagnosis present

## 2024-03-10 DIAGNOSIS — Z8619 Personal history of other infectious and parasitic diseases: Secondary | ICD-10-CM | POA: Diagnosis not present

## 2024-03-10 DIAGNOSIS — N1832 Chronic kidney disease, stage 3b: Secondary | ICD-10-CM | POA: Diagnosis present

## 2024-03-10 DIAGNOSIS — R630 Anorexia: Secondary | ICD-10-CM | POA: Diagnosis present

## 2024-03-10 DIAGNOSIS — E860 Dehydration: Secondary | ICD-10-CM | POA: Diagnosis present

## 2024-03-10 DIAGNOSIS — I2510 Atherosclerotic heart disease of native coronary artery without angina pectoris: Secondary | ICD-10-CM | POA: Diagnosis present

## 2024-03-10 DIAGNOSIS — G309 Alzheimer's disease, unspecified: Secondary | ICD-10-CM | POA: Diagnosis present

## 2024-03-10 DIAGNOSIS — W1830XA Fall on same level, unspecified, initial encounter: Secondary | ICD-10-CM | POA: Diagnosis present

## 2024-03-10 DIAGNOSIS — Z85828 Personal history of other malignant neoplasm of skin: Secondary | ICD-10-CM | POA: Diagnosis not present

## 2024-03-10 DIAGNOSIS — Z8673 Personal history of transient ischemic attack (TIA), and cerebral infarction without residual deficits: Secondary | ICD-10-CM | POA: Diagnosis not present

## 2024-03-10 DIAGNOSIS — E871 Hypo-osmolality and hyponatremia: Secondary | ICD-10-CM | POA: Diagnosis present

## 2024-03-10 DIAGNOSIS — E66811 Obesity, class 1: Secondary | ICD-10-CM | POA: Diagnosis present

## 2024-03-10 DIAGNOSIS — Z1623 Resistance to quinolones and fluoroquinolones: Secondary | ICD-10-CM | POA: Diagnosis present

## 2024-03-10 DIAGNOSIS — Z1612 Extended spectrum beta lactamase (ESBL) resistance: Secondary | ICD-10-CM | POA: Diagnosis present

## 2024-03-10 LAB — BASIC METABOLIC PANEL WITH GFR
Anion gap: 6 (ref 5–15)
BUN: 32 mg/dL — ABNORMAL HIGH (ref 8–23)
CO2: 23 mmol/L (ref 22–32)
Calcium: 8.4 mg/dL — ABNORMAL LOW (ref 8.9–10.3)
Chloride: 108 mmol/L (ref 98–111)
Creatinine, Ser: 1.25 mg/dL — ABNORMAL HIGH (ref 0.44–1.00)
GFR, Estimated: 40 mL/min — ABNORMAL LOW (ref >60)
Glucose, Bld: 146 mg/dL — ABNORMAL HIGH (ref 70–99)
Potassium: 4.4 mmol/L (ref 3.5–5.1)
Sodium: 137 mmol/L (ref 135–145)

## 2024-03-10 LAB — BLOOD CULTURE ID PANEL (REFLEXED) - BCID2

## 2024-03-10 LAB — CBC
HCT: 31.9 % — ABNORMAL LOW (ref 36.0–46.0)
Hemoglobin: 10.4 g/dL — ABNORMAL LOW (ref 12.0–15.0)
MCH: 32.1 pg (ref 26.0–34.0)
MCHC: 32.6 g/dL (ref 30.0–36.0)
MCV: 98.5 fL (ref 80.0–100.0)
Platelets: 197 10*3/uL (ref 150–400)
RBC: 3.24 MIL/uL — ABNORMAL LOW (ref 3.87–5.11)
RDW: 13.3 % (ref 11.5–15.5)
WBC: 9.3 10*3/uL (ref 4.0–10.5)
nRBC: 0 % (ref 0.0–0.2)

## 2024-03-10 LAB — MAGNESIUM: Magnesium: 2 mg/dL (ref 1.7–2.4)

## 2024-03-10 NOTE — ED Notes (Signed)
 ED TO INPATIENT HANDOFF REPORT  ED Nurse Name and Phone #: [phone]  S Name/Age/Gender Kimberly Scott 89 y.o. female Room/Bed: ED33A/ED33A  Code Status   Code Status: Full Code  Home/SNF/Other Home Patient oriented to: self and place Is this baseline? Yes   Triage Complete: Triage complete  Chief Complaint Complicated UTI (urinary tract infection) [N39.0]  Triage Note Pt brought in from home by EMS for a fall. Pt states she was walking with her walker but had her cane with her. Somehow the cane came up and hit her in the left side of the head and she fell. Pt denies any dizziness or loss of consciousness. Pt is not on any blood thinners. Denies any complaints of pain.    Allergies Allergies  Allergen Reactions   Bacitracin-Neomycin-Polymyxin Swelling and Rash    Blisters Blisters    Ace Inhibitors     Other reaction(s): Cough   Omega 3  [Fish Oil] Other (See Comments)   Neosporin [Neomycin-Bacitracin Zn-Polymyx] Rash   Tape Rash    Blisters    Level of Care/Admitting Diagnosis ED Disposition     ED Disposition  Admit   Condition  --   Comment  Hospital Area: [facility] [[id number]]  Level of Care: Med-Surg [16]  Covid Evaluation: Asymptomatic - no recent exposure (last 10 days) testing not required  Diagnosis: Complicated UTI (urinary tract infection) [960454]  Admitting Physician: Avi Body [[id number]]  Attending Physician: Hardie Leyland          B Medical/Surgery History Past Medical History:  Diagnosis Date   Anginal pain (HCC)    Arthritis    Cancer (HCC)    FACIAL SKIN CANCER   Coronary artery disease    Headache(784.0)    Heart murmur    Hypertension    Pneumonia    hx of PNA   Past Surgical History:  Procedure Laterality Date   ABDOMINAL HYSTERECTOMY     APPENDECTOMY     cataracts     CORONARY ARTERY BYPASS GRAFT  10/01/2012   Procedure: CORONARY ARTERY BYPASS GRAFTING (CABG);  Surgeon: Norita Beauvais, MD;  Location: [facility] OR;  Service: Open Heart Surgery;  Laterality: N/A;  times five using Left Internal Mammary Artery    ENDOVEIN HARVEST OF GREATER SAPHENOUS VEIN  10/01/2012   Procedure: ENDOVEIN HARVEST OF GREATER SAPHENOUS VEIN;  Surgeon: Norita Beauvais, MD;  Location: [facility] OR;  Service: Open Heart Surgery;  Laterality: Bilateral;   ESOPHAGOGASTRODUODENOSCOPY N/A 06/30/2021   Procedure: ESOPHAGOGASTRODUODENOSCOPY (EGD);  Surgeon: Toledo, Alphonsus Jeans, MD;  Location: [facility] ENDOSCOPY;  Service: Gastroenterology;  Laterality: N/A;   KNEE SURGERY     TONSILLECTOMY     TOTAL KNEE ARTHROPLASTY     right knee     A IV Location/Drains/Wounds Patient Lines/Drains/Airways Status     Active Line/Drains/Airways     Name Placement date Placement time Site Days   Peripheral IV 03/09/24 20 G Left Antecubital 03/09/24  1235  Antecubital  1            Intake/Output Last 24 hours  Intake/Output Summary (Last 24 hours) at 03/10/2024 0418 Last data filed at 03/10/2024 0334 Gross per 24 hour  Intake 150 ml  Output --  Net 150 ml    Labs/Imaging Results for orders placed or performed during the hospital encounter of 03/09/24 (from the past 48 hours)  Lactic acid, plasma     Status: None   Collection Time: 03/09/24 10:36 AM  Result Value Ref Range   Lactic Acid, Venous 0.9 0.5 - 1.9 mmol/L    Comment: Performed at [facility], [address] [place], [place] [address]  CBC     Status: Abnormal   Collection Time: 03/09/24 12:39 PM  Result Value Ref Range   WBC 9.5 4.0 - 10.5 K/uL   RBC 3.64 (L) 3.87 - 5.11 MIL/uL   Hemoglobin 11.6 (L) 12.0 - 15.0 g/dL   HCT 60.4 (L) 54.0 - 98.1 %   MCV 96.2 80.0 - 100.0 fL   MCH 31.9 26.0 - 34.0 pg   MCHC 33.1 30.0 - 36.0 g/dL   RDW 19.1 47.8 - 29.5 %   Platelets 228 150 - 400 K/uL   nRBC 0.0 0.0 - 0.2 %    Comment: Performed at [facility], [address] [place], [place] [address]  Comprehensive metabolic panel      Status: Abnormal   Collection Time: 03/09/24 12:39 PM  Result Value Ref Range   Sodium 136 135 - 145 mmol/L   Potassium 4.7 3.5 - 5.1 mmol/L   Chloride 104 98 - 111 mmol/L   CO2 23 22 - 32 mmol/L   Glucose, Bld 104 (H) 70 - 99 mg/dL    Comment: Glucose reference range applies only to samples taken after fasting for at least 8 hours.   BUN 35 (H) 8 - 23 mg/dL   Creatinine, Ser 8.65 (H) 0.44 - 1.00 mg/dL   Calcium 9.4 8.9 - 78.4 mg/dL   Total Protein 7.3 6.5 - 8.1 g/dL   Albumin 3.9 3.5 - 5.0 g/dL   AST 25 15 - 41 U/L   ALT 11 0 - 44 U/L   Alkaline Phosphatase 50 38 - 126 U/L   Total Bilirubin 1.2 0.0 - 1.2 mg/dL   GFR, Estimated 35 (L) >60 mL/min    Comment: (NOTE) Calculated using the CKD-EPI Creatinine Equation (2021)    Anion gap 9 5 - 15    Comment: Performed at [facility], [address] [place], [place] [address]  Troponin I (High Sensitivity)     Status: Abnormal   Collection Time: 03/09/24 12:39 PM  Result Value Ref Range   Troponin I (High Sensitivity) 28 (H) <18 ng/L    Comment: (NOTE) Elevated high sensitivity troponin I (hsTnI) values and significant  changes across serial measurements may suggest ACS but many other  chronic and acute conditions are known to elevate hsTnI results.  Refer to the "Links" section for chest pain algorithms and additional  guidance. Performed at [facility], [address] [place], [place] [address]   CK     Status: None   Collection Time: 03/09/24 12:39 PM  Result Value Ref Range   Total CK 59 38 - 234 U/L    Comment: Performed at [facility], [address] [place], [place] [address]  Urinalysis, w/ Reflex to Culture (Infection Suspected) -Urine, Clean Catch     Status: Abnormal   Collection Time: 03/09/24  3:34 PM  Result Value Ref Range   Specimen Source URINE, CLEAN CATCH    Color, Urine YELLOW (A) YELLOW   APPearance HAZY (A) CLEAR   Specific Gravity, Urine 1.011 1.005 - 1.030   pH 5.0  5.0 - 8.0   Glucose, UA NEGATIVE NEGATIVE mg/dL   Hgb urine dipstick NEGATIVE NEGATIVE   Bilirubin Urine NEGATIVE NEGATIVE   Ketones, ur NEGATIVE NEGATIVE mg/dL   Protein, ur NEGATIVE NEGATIVE mg/dL  Nitrite POSITIVE (A) NEGATIVE   Leukocytes,Ua SMALL (A) NEGATIVE   RBC / HPF 0-5 0 - 5 RBC/hpf   WBC, UA 11-20 0 - 5 WBC/hpf    Comment:        Reflex urine culture not performed if WBC <=10, OR if Squamous epithelial cells >5. If Squamous epithelial cells >5 suggest recollection.    Bacteria, UA MANY (A) NONE SEEN   Squamous Epithelial / HPF 0 0 - 5 /HPF   WBC Clumps PRESENT    Mucus PRESENT     Comment: Performed at [facility], [address] [place], [place] [address]  Troponin I (High Sensitivity)     Status: Abnormal   Collection Time: 03/09/24  3:34 PM  Result Value Ref Range   Troponin I (High Sensitivity) 29 (H) <18 ng/L    Comment: (NOTE) Elevated high sensitivity troponin I (hsTnI) values and significant  changes across serial measurements may suggest ACS but many other  chronic and acute conditions are known to elevate hsTnI results.  Refer to the "Links" section for chest pain algorithms and additional  guidance. Performed at [facility], [address] [place], [place] [address]   Blood culture (routine x 2)     Status: None (Preliminary result)   Collection Time: 03/09/24  4:36 PM   Specimen: BLOOD RIGHT ARM  Result Value Ref Range   Specimen Description BLOOD RIGHT ARM    Special Requests      BOTTLES DRAWN AEROBIC AND ANAEROBIC Blood Culture results may not be optimal due to an inadequate volume of blood received in culture bottles   Culture  Setup Time      IN BOTH AEROBIC AND ANAEROBIC BOTTLES GRAM NEGATIVE RODS Organism ID to follow Performed at [facility], [address] [place], [place] [address]    Culture PENDING    Report Status PENDING   Lactic acid, plasma     Status: None   Collection Time: 03/09/24  6:13  PM  Result Value Ref Range   Lactic Acid, Venous 1.6 0.5 - 1.9 mmol/L    Comment: Performed at [facility], [address] [place], [place] [address]  Basic metabolic panel     Status: Abnormal   Collection Time: 03/10/24  1:54 AM  Result Value Ref Range   Sodium 137 135 - 145 mmol/L   Potassium 4.4 3.5 - 5.1 mmol/L   Chloride 108 98 - 111 mmol/L   CO2 23 22 - 32 mmol/L   Glucose, Bld 146 (H) 70 - 99 mg/dL    Comment: Glucose reference range applies only to samples taken after fasting for at least 8 hours.   BUN 32 (H) 8 - 23 mg/dL   Creatinine, Ser 2.95 (H) 0.44 - 1.00 mg/dL   Calcium 8.4 (L) 8.9 - 10.3 mg/dL   GFR, Estimated 40 (L) >60 mL/min    Comment: (NOTE) Calculated using the CKD-EPI Creatinine Equation (2021)    Anion gap 6 5 - 15    Comment: Performed at [facility], [address] [place], [place] [address]  CBC     Status: Abnormal   Collection Time: 03/10/24  1:54 AM  Result Value Ref Range   WBC 9.3 4.0 - 10.5 K/uL   RBC 3.24 (L) 3.87 - 5.11 MIL/uL   Hemoglobin 10.4 (L) 12.0 - 15.0 g/dL   HCT 24.4 (L) 01.0 - 27.2 %   MCV 98.5 80.0 - 100.0 fL   MCH  32.1 26.0 - 34.0 pg   MCHC 32.6 30.0 - 36.0 g/dL   RDW 16.1 09.6 - 04.5 %   Platelets 197 150 - 400 K/uL   nRBC 0.0 0.0 - 0.2 %    Comment: Performed at [facility], [address] [place], [place] [address]  Magnesium     Status: None   Collection Time: 03/10/24  1:54 AM  Result Value Ref Range   Magnesium 2.0 1.7 - 2.4 mg/dL    Comment: Performed at [facility], [address] [place], [place] [address]   CT Head Wo Contrast Result Date: 03/09/2024 CLINICAL DATA:  Head trauma, minor (Age >= 65y); Neck trauma (Age >= 65y). Fall. EXAM: CT HEAD WITHOUT CONTRAST CT CERVICAL SPINE WITHOUT CONTRAST TECHNIQUE: Multidetector CT imaging of the head and cervical spine was performed following the standard protocol without intravenous contrast. Multiplanar CT image  reconstructions of the cervical spine were also generated. RADIATION DOSE REDUCTION: This exam was performed according to the departmental dose-optimization program which includes automated exposure control, adjustment of the mA and/or kV according to patient size and/or use of iterative reconstruction technique. COMPARISON:  CT head 03/24/2023.  MRI cervical spine 04/29/2022. FINDINGS: CT HEAD FINDINGS Brain: There is no evidence of an acute infarct, intracranial hemorrhage, mass, midline shift, or extra-axial fluid collection. Confluent cerebral white matter hypodensities are unchanged and nonspecific but compatible with severe chronic small vessel ischemic disease. Mild cerebral atrophy is considered to be within normal limits for age. Vascular: Calcified atherosclerosis at the skull base. No hyperdense vessel. Skull: No acute fracture or suspicious lesion. Sinuses/Orbits: Paranasal sinuses and mastoid air cells are clear. Bilateral cataract extraction. Other: None. CT CERVICAL SPINE FINDINGS Alignment: Unchanged trace anterolisthesis of C4 on C5 and 4 mm anterolisthesis of C5 on C6. Skull base and vertebrae: No acute fracture or destructive lesion. Soft tissues and spinal canal: No prevertebral fluid or swelling. No visible canal hematoma. Disc levels: Moderate to severe disc degeneration at C6-7 and mild to moderate degeneration at C5-6. Moderately advanced multilevel facet arthrosis, with facet ankylosis on the left at C4-5 and on the right at C5-6. Moderate right neural foraminal stenosis at C5-6 and C6-7. No evidence of high-grade spinal canal stenosis. Upper chest: No mass or consolidation in the included lung apices. Other: Prominent atherosclerotic calcification at the carotid bifurcations. IMPRESSION: 1. No evidence of acute intracranial abnormality. 2. Severe chronic small vessel ischemic disease. 3. No acute cervical spine fracture or traumatic malalignment. Electronically Signed   By: Aundra Lee  M.D.   On: 03/09/2024 14:28   CT Cervical Spine Wo Contrast Result Date: 03/09/2024 CLINICAL DATA:  Head trauma, minor (Age >= 65y); Neck trauma (Age >= 65y). Fall. EXAM: CT HEAD WITHOUT CONTRAST CT CERVICAL SPINE WITHOUT CONTRAST TECHNIQUE: Multidetector CT imaging of the head and cervical spine was performed following the standard protocol without intravenous contrast. Multiplanar CT image reconstructions of the cervical spine were also generated. RADIATION DOSE REDUCTION: This exam was performed according to the departmental dose-optimization program which includes automated exposure control, adjustment of the mA and/or kV according to patient size and/or use of iterative reconstruction technique. COMPARISON:  CT head 03/24/2023.  MRI cervical spine 04/29/2022. FINDINGS: CT HEAD FINDINGS Brain: There is no evidence of an acute infarct, intracranial hemorrhage, mass, midline shift, or extra-axial fluid collection. Confluent cerebral white matter hypodensities are unchanged and nonspecific but compatible with severe chronic small vessel ischemic disease. Mild cerebral atrophy is considered to be within  normal limits for age. Vascular: Calcified atherosclerosis at the skull base. No hyperdense vessel. Skull: No acute fracture or suspicious lesion. Sinuses/Orbits: Paranasal sinuses and mastoid air cells are clear. Bilateral cataract extraction. Other: None. CT CERVICAL SPINE FINDINGS Alignment: Unchanged trace anterolisthesis of C4 on C5 and 4 mm anterolisthesis of C5 on C6. Skull base and vertebrae: No acute fracture or destructive lesion. Soft tissues and spinal canal: No prevertebral fluid or swelling. No visible canal hematoma. Disc levels: Moderate to severe disc degeneration at C6-7 and mild to moderate degeneration at C5-6. Moderately advanced multilevel facet arthrosis, with facet ankylosis on the left at C4-5 and on the right at C5-6. Moderate right neural foraminal stenosis at C5-6 and C6-7. No evidence  of high-grade spinal canal stenosis. Upper chest: No mass or consolidation in the included lung apices. Other: Prominent atherosclerotic calcification at the carotid bifurcations. IMPRESSION: 1. No evidence of acute intracranial abnormality. 2. Severe chronic small vessel ischemic disease. 3. No acute cervical spine fracture or traumatic malalignment. Electronically Signed   By: Aundra Lee M.D.   On: 03/09/2024 14:28   DG Hip Unilat With Pelvis 2-3 Views Right Result Date: 03/09/2024 CLINICAL DATA:  Marvell Slider EXAM: DG HIP (WITH OR WITHOUT PELVIS) 2-3V RIGHT COMPARISON:  03/24/2023 FINDINGS: There is no evidence of hip fracture or dislocation. There is no evidence of arthropathy or other focal bone abnormality. Extensive femoral arterial calcifications. IMPRESSION: Negative. Electronically Signed   By: Nicoletta Barrier M.D.   On: 03/09/2024 13:18   DG Chest 1 View Result Date: 03/09/2024 CLINICAL DATA:  Marvell Slider EXAM: CHEST  1 VIEW COMPARISON:  12/07/2021 FINDINGS: Prominent perihilar interstitial markings left greater than right. No confluent airspace disease. No pneumothorax. Heart size upper limits normal. Aortic Atherosclerosis (ICD10-170.0). Post CABG. IMPRESSION: Prominent perihilar interstitial markings left greater than right. Electronically Signed   By: Nicoletta Barrier M.D.   On: 03/09/2024 13:17   DG Shoulder Right Result Date: 03/09/2024 CLINICAL DATA:  Marvell Slider EXAM: RIGHT SHOULDER - 2+ VIEW COMPARISON:  12/09/2021 FINDINGS: No fracture or dislocation.  Mild diffuse osteopenia.  Post CABG. IMPRESSION: No acute findings. Electronically Signed   By: Nicoletta Barrier M.D.   On: 03/09/2024 13:16    Pending Labs Unresulted Labs (From admission, onward)     Start     Ordered   03/09/24 1636  Blood Culture ID Panel (Reflexed)  Once,   STAT        03/09/24 1636   03/09/24 1554  Blood culture (routine x 2)  BLOOD CULTURE X 2,   STAT      03/09/24 1554   03/09/24 1534  Urine Culture  Once,   R        03/09/24 1534             Vitals/Pain Today's Vitals   03/10/24 0158 03/10/24 0227 03/10/24 0325 03/10/24 0332  BP:   (!) 125/51   Pulse:   60   Resp:   14   Temp: 98.3 F (36.8 C)     TempSrc: Oral     SpO2:   99%   Weight:      Height:      PainSc:  0-No pain Asleep Asleep    Isolation Precautions No active isolations  Medications Medications  sodium chloride flush (NS) 0.9 % injection 3 mL (3 mLs Intravenous Given 03/09/24 2109)  acetaminophen (TYLENOL) tablet 650 mg (has no administration in time range)    Or  acetaminophen (TYLENOL) suppository 650  mg (has no administration in time range)  ondansetron (ZOFRAN) tablet 4 mg (has no administration in time range)    Or  ondansetron (ZOFRAN) injection 4 mg (has no administration in time range)  polyethylene glycol (MIRALAX / GLYCOLAX) packet 17 g (has no administration in time range)  multivitamin with minerals tablet 1 tablet (1 tablet Oral Given 03/09/24 1811)  enoxaparin (LOVENOX) injection 30 mg (30 mg Subcutaneous Given 03/09/24 1811)  lactated ringers infusion (0 mLs Intravenous Stopped 03/10/24 0334)  ascorbic acid (VITAMIN C) tablet 1,000 mg (has no administration in time range)  aspirin EC tablet 81 mg (has no administration in time range)  atorvastatin (LIPITOR) tablet 40 mg (has no administration in time range)  cholecalciferol (VITAMIN D3) 25 MCG (1000 UNIT) tablet 1,000 Units (has no administration in time range)  feeding supplement (ENSURE ENLIVE / ENSURE PLUS) liquid 237 mL (237 mLs Oral Given 03/09/24 2001)  carbidopa-levodopa (SINEMET IR) 25-100 MG per tablet immediate release 2 tablet (has no administration in time range)    And  carbidopa-levodopa (SINEMET IR) 25-100 MG per tablet immediate release 1 tablet (has no administration in time range)  meropenem (MERREM) 1 g in sodium chloride 0.9 % 100 mL IVPB (has no administration in time range)  sodium chloride 0.9 % bolus 1,000 mL (0 mLs Intravenous Stopped 03/09/24 1420)   acetaminophen (TYLENOL) tablet 1,000 mg (1,000 mg Oral Given 03/09/24 1224)  meropenem (MERREM) 1 g in sodium chloride 0.9 % 100 mL IVPB (0 g Intravenous Stopped 03/09/24 1752)    Mobility walks with device     Focused Assessments     R Recommendations: See Admitting Provider Note  Report given to:   Additional Notes:

## 2024-03-10 NOTE — Progress Notes (Signed)
 PHARMACY - PHYSICIAN COMMUNICATION CRITICAL VALUE ALERT - BLOOD CULTURE IDENTIFICATION (BCID)  Kimberly Scott is an 89 y.o. female who presented to [facility] on 03/09/2024 with a chief complaint of complicated UTI.   Assessment:  E Coli in 2 of 4 bottles, CTX resistance detected.  (include suspected source if known)  Name of physician (or Provider) Contacted:  Elisabeth Guild, NP   Current antibiotics: meropenem 1 gm IV Q12H   Changes to prescribed antibiotics recommended:  Patient is on recommended antibiotics - No changes needed  No results found for this or any previous visit.  Cecilio Ohlrich D 03/10/2024  5:30 AM

## 2024-03-10 NOTE — Progress Notes (Signed)
 PROGRESS NOTE    KAMERIN AXFORD  ZOX:[id number] DOB: 12-20-1927 DOA: 03/09/2024 PCP: Melchor Spoon, MD    Brief Narrative:   From admission h and p  CORTANA VANDERFORD is a 89 y.o. female with medical history significant of Parkinson's disease, CAD s/p CABG (2013), TIA, mild Alzheimer's dementia, hypertension, hyperlipidemia, who presents to the ED due to ground-level fall.   History provided by both daughter and Mrs. Lenahan at bedside.  Mrs. Mcneary states that over the past few days, she has been feeling increasingly weak with a poor appetite.  Her daughter states that a few days ago, she noticed low blood pressure and called her PCP, who recommended she cut the telmisartan dose in half.  The day after doing so, blood pressure returned back to normal.  Last night, patient states that she was trying to put her cane down next to her walker when she lost her footing and somehow slipped.  The cane ended up hitting her in the head but she denies any loss of consciousness.  At this time, she denies any nausea, vomiting, abdominal pain, dysuria, hematuria, urinary frequency or urgency.  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 No murmur. Hemodynamically stable - continue meropenem, await sensitivities - check ct abdomen/pelvis  # Falls Likely weak from bacteremia. CT head/nec, hips, shoulder all neg, no new pains - PT consult  # Parkinson's disease # Dementia Stable - home sinemet  # Hx TIA - home asa, statin  # HTN Here bp wnl - holding home telmisartan   DVT prophylaxis: lovenox Code Status:  full Family Communication: daughter updated @ bedside 4/13  Level of care: Med-Surg Status is: Inpatient Remains inpatient appropriate because: severity of illness    Consultants:  Will plan on reviewing w/ ID tomorrow  Procedures: none  Antimicrobials:  meropenem    Subjective: Reports feeling fatigued, otherwise fine  Objective: Vitals:   03/10/24 0556 03/10/24 0637 03/10/24 0700 03/10/24 0841  BP:  (!) 165/127 102/70 (!) 113/48  Pulse:  63    Resp:  18  18  Temp: 99.4 F (37.4 C) 99.7 F (37.6 C)  98.2 F (36.8 C)  TempSrc: Oral Oral  Oral  SpO2:  94%  95%  Weight:  70.4 kg    Height:        Intake/Output Summary (Last 24 hours) at 03/10/2024 1211 Last data filed at 03/10/2024 0500 Gross per 24 hour  Intake 250 ml  Output 450 ml  Net -200 ml   Filed Weights   03/09/24 1128 03/10/24 0637  Weight: 67.2 kg 70.4 kg    Examination:  General exam: Appears calm and comfortable  Respiratory system: Clear to auscultation. Respiratory effort normal. Cardiovascular system: S1 & S2 heard, RRR.   Gastrointestinal system: Abdomen is nondistended, soft and nontender.   Central nervous system: Alert and oriented. No focal neurological deficits. Extremities: Symmetric 5 x 5 power. Skin: No rashes, lesions or ulcers Psychiatry: Judgement and insight appear normal. Mood & affect appropriate.     Data Reviewed: I have personally reviewed following labs and imaging  studies  CBC: Recent Labs  Lab 03/09/24 1239 03/10/24 0154  WBC 9.5 9.3  HGB 11.6* 10.4*  HCT 35.0* 31.9*  MCV 96.2 98.5  PLT 228 197   Basic Metabolic Panel: Recent Labs  Lab 03/09/24 1239 03/10/24 0154  NA 136 137  K 4.7 4.4  CL 104 108  CO2 23 23  GLUCOSE 104* 146*  BUN 35* 32*  CREATININE 1.39* 1.25*  CALCIUM 9.4 8.4*  MG  --  2.0   GFR: Estimated Creatinine Clearance: 24.6 mL/min (A) (by C-G formula based on SCr of 1.25 mg/dL (H)). Liver Function Tests: Recent Labs  Lab  03/09/24 1239  AST 25  ALT 11  ALKPHOS 50  BILITOT 1.2  PROT 7.3  ALBUMIN 3.9   No results for input(s): "LIPASE", "AMYLASE" in the last 168 hours. No results for input(s): "AMMONIA" in the last 168 hours. Coagulation Profile: No results for input(s): "INR", "PROTIME" in the last 168 hours. Cardiac Enzymes: Recent Labs  Lab 03/09/24 1239  CKTOTAL 59   BNP (last 3 results) No results for input(s): "PROBNP" in the last 8760 hours. HbA1C: No results for input(s): "HGBA1C" in the last 72 hours. CBG: No results for input(s): "GLUCAP" in the last 168 hours. Lipid Profile: No results for input(s): "CHOL", "HDL", "LDLCALC", "TRIG", "CHOLHDL", "LDLDIRECT" in the last 72 hours. Thyroid Function Tests: No results for input(s): "TSH", "T4TOTAL", "FREET4", "T3FREE", "THYROIDAB" in the last 72 hours. Anemia Panel: No results for input(s): "VITAMINB12", "FOLATE", "FERRITIN", "TIBC", "IRON", "RETICCTPCT" in the last 72 hours. Urine analysis:    Component Value Date/Time   COLORURINE YELLOW (A) 03/09/2024 1534   APPEARANCEUR HAZY (A) 03/09/2024 1534   APPEARANCEUR Clear 05/03/2023 1437   LABSPEC 1.011 03/09/2024 1534   LABSPEC 1.015 06/01/2013 1452   PHURINE 5.0 03/09/2024 1534   GLUCOSEU NEGATIVE 03/09/2024 1534   GLUCOSEU 50 mg/dL 86/57/8469 6295   HGBUR NEGATIVE 03/09/2024 1534   BILIRUBINUR NEGATIVE 03/09/2024 1534   BILIRUBINUR Negative 05/03/2023 1437   BILIRUBINUR Negative 06/01/2013 1452   KETONESUR NEGATIVE 03/09/2024 1534   PROTEINUR NEGATIVE 03/09/2024 1534   UROBILINOGEN 0.2 09/30/2012 1930   NITRITE POSITIVE (A) 03/09/2024 1534   LEUKOCYTESUR SMALL (A) 03/09/2024 1534   LEUKOCYTESUR Negative 06/01/2013 1452   Sepsis Labs: @LABRCNTIP (procalcitonin:4,lacticidven:4)  ) Recent Results (from the past 240 hours)  Urine Culture     Status: Abnormal (Preliminary result)   Collection Time: 03/09/24  3:34 PM   Specimen: Urine, Random  Result Value Ref Range Status    Specimen Description   Final    URINE, RANDOM Performed at [facility], [address] [place], [place] [address]    Special Requests   Final    NONE Reflexed from [phone] Performed at [facility], [address], [place], [place] [address]    Culture (A)  Final    >=100,000 COLONIES/mL Hillis Lu NEGATIVE RODS SUSCEPTIBILITIES TO FOLLOW Performed at [facility], [address], [place], [place] [address]    Report Status PENDING  Incomplete  Blood culture (routine x 2)     Status: None (Preliminary result)   Collection Time: 03/09/24  4:36 PM   Specimen: BLOOD RIGHT ARM  Result Value Ref Range Status   Specimen Description   Final    BLOOD RIGHT ARM Performed at [facility], [address], [place], [place] [address]    Special Requests   Final    BOTTLES DRAWN AEROBIC AND ANAEROBIC Blood Culture results may  not be optimal due to an inadequate volume of blood received in culture bottles Performed at [facility], [address] [place], [place] [address]    Culture  Setup Time   Final    IN BOTH AEROBIC AND ANAEROBIC BOTTLES GRAM NEGATIVE RODS CRITICAL RESULT CALLED TO, READ BACK BY AND VERIFIED WITH: JASON ROBBINS @ 1914 03/10/24 [facility] Performed at [facility], [address] [place], [place] [address]    Culture GRAM NEGATIVE RODS  Final   Report Status PENDING  Incomplete  Blood culture (routine x 2)     Status: None (Preliminary result)   Collection Time: 03/09/24  4:36 PM   Specimen: BLOOD LEFT ARM  Result Value Ref Range Status   Specimen Description   Final    BLOOD LEFT ARM Performed at [facility], [address], [place], [place] [address]    Special Requests   Final    BOTTLES DRAWN AEROBIC AND ANAEROBIC Blood Culture results may not be optimal due to an inadequate volume of blood received in culture bottles Performed at [facility], [address] [place], [place] [address]    Culture   Setup Time   Final    IN BOTH AEROBIC AND ANAEROBIC BOTTLES GRAM NEGATIVE RODS CRITICAL RESULT CALLED TO, READ BACK BY AND VERIFIED WITH: JASON ROBBINS @ 0531 03/10/24 [facility] CRITICAL VALUE NOTED.  VALUE IS CONSISTENT WITH PREVIOUSLY REPORTED AND CALLED VALUE. Performed at [facility], [address], [place], [place] [address]    Culture GRAM NEGATIVE RODS  Final   Report Status PENDING  Incomplete  Blood Culture ID Panel (Reflexed)     Status: Abnormal   Collection Time: 03/09/24  4:36 PM  Result Value Ref Range Status   Enterococcus faecalis NOT DETECTED NOT DETECTED Final   Enterococcus Faecium NOT DETECTED NOT DETECTED Final   Listeria monocytogenes NOT DETECTED NOT DETECTED Final   Staphylococcus species NOT DETECTED NOT DETECTED Final   Staphylococcus aureus (BCID) NOT DETECTED NOT DETECTED Final   Staphylococcus epidermidis NOT DETECTED NOT DETECTED Final   Staphylococcus lugdunensis NOT DETECTED NOT DETECTED Final   Streptococcus species NOT DETECTED NOT DETECTED Final   Streptococcus agalactiae NOT DETECTED NOT DETECTED Final   Streptococcus pneumoniae NOT DETECTED NOT DETECTED Final   Streptococcus pyogenes NOT DETECTED NOT DETECTED Final   A.calcoaceticus-baumannii NOT DETECTED NOT DETECTED Final   Bacteroides fragilis NOT DETECTED NOT DETECTED Final   Enterobacterales DETECTED (A) NOT DETECTED Final    Comment: Enterobacterales represent a large order of gram negative bacteria, not a single organism. CRITICAL RESULT CALLED TO, READ BACK BY AND VERIFIED WITH: JASON ROBBINS @ 0531 03/10/24 [facility]    Enterobacter cloacae complex NOT DETECTED NOT DETECTED Final   Escherichia coli DETECTED (A) NOT DETECTED Final    Comment: CRITICAL RESULT CALLED TO, READ BACK BY AND VERIFIED WITH: JASON ROBBINS @ 0531 03/10/24 [facility]    Klebsiella aerogenes NOT DETECTED NOT DETECTED Final   Klebsiella oxytoca NOT DETECTED NOT DETECTED Final   Klebsiella pneumoniae NOT DETECTED NOT DETECTED  Final   Proteus species NOT DETECTED NOT DETECTED Final   Salmonella species NOT DETECTED NOT DETECTED Final   Serratia marcescens NOT DETECTED NOT DETECTED Final   Haemophilus influenzae NOT DETECTED NOT DETECTED Final   Neisseria meningitidis NOT DETECTED NOT DETECTED Final   Pseudomonas aeruginosa NOT DETECTED NOT DETECTED Final   Stenotrophomonas maltophilia NOT DETECTED NOT DETECTED Final   Candida albicans NOT  DETECTED NOT DETECTED Final   Candida auris NOT DETECTED NOT DETECTED Final   Candida glabrata NOT DETECTED NOT DETECTED Final   Candida krusei NOT DETECTED NOT DETECTED Final   Candida parapsilosis NOT DETECTED NOT DETECTED Final   Candida tropicalis NOT DETECTED NOT DETECTED Final   Cryptococcus neoformans/gattii NOT DETECTED NOT DETECTED Final   CTX-M ESBL DETECTED (A) NOT DETECTED Final    Comment: CRITICAL RESULT CALLED TO, READ BACK BY AND VERIFIED WITH: JASON ROBBINS @ 0531 03/10/24 [facility] (NOTE) Extended spectrum beta-lactamase detected. Recommend a carbapenem as initial therapy.      Carbapenem resistance IMP NOT DETECTED NOT DETECTED Final   Carbapenem resistance KPC NOT DETECTED NOT DETECTED Final   Carbapenem resistance NDM NOT DETECTED NOT DETECTED Final   Carbapenem resist OXA 48 LIKE NOT DETECTED NOT DETECTED Final   Carbapenem resistance VIM NOT DETECTED NOT DETECTED Final    Comment: Performed at [facility], [address], [place], [place] [address]         Radiology Studies: CT Head Wo Contrast Result Date: 03/09/2024 CLINICAL DATA:  Head trauma, minor (Age >= 65y); Neck trauma (Age >= 65y). Fall. EXAM: CT HEAD WITHOUT CONTRAST CT CERVICAL SPINE WITHOUT CONTRAST TECHNIQUE: Multidetector CT imaging of the head and cervical spine was performed following the standard protocol without intravenous contrast. Multiplanar CT image reconstructions of the cervical spine were also generated. RADIATION DOSE REDUCTION: This exam was performed according  to the departmental dose-optimization program which includes automated exposure control, adjustment of the mA and/or kV according to patient size and/or use of iterative reconstruction technique. COMPARISON:  CT head 03/24/2023.  MRI cervical spine 04/29/2022. FINDINGS: CT HEAD FINDINGS Brain: There is no evidence of an acute infarct, intracranial hemorrhage, mass, midline shift, or extra-axial fluid collection. Confluent cerebral white matter hypodensities are unchanged and nonspecific but compatible with severe chronic small vessel ischemic disease. Mild cerebral atrophy is considered to be within normal limits for age. Vascular: Calcified atherosclerosis at the skull base. No hyperdense vessel. Skull: No acute fracture or suspicious lesion. Sinuses/Orbits: Paranasal sinuses and mastoid air cells are clear. Bilateral cataract extraction. Other: None. CT CERVICAL SPINE FINDINGS Alignment: Unchanged trace anterolisthesis of C4 on C5 and 4 mm anterolisthesis of C5 on C6. Skull base and vertebrae: No acute fracture or destructive lesion. Soft tissues and spinal canal: No prevertebral fluid or swelling. No visible canal hematoma. Disc levels: Moderate to severe disc degeneration at C6-7 and mild to moderate degeneration at C5-6. Moderately advanced multilevel facet arthrosis, with facet ankylosis on the left at C4-5 and on the right at C5-6. Moderate right neural foraminal stenosis at C5-6 and C6-7. No evidence of high-grade spinal canal stenosis. Upper chest: No mass or consolidation in the included lung apices. Other: Prominent atherosclerotic calcification at the carotid bifurcations. IMPRESSION: 1. No evidence of acute intracranial abnormality. 2. Severe chronic small vessel ischemic disease. 3. No acute cervical spine fracture or traumatic malalignment. Electronically Signed   By: Aundra Lee M.D.   On: 03/09/2024 14:28   CT Cervical Spine Wo Contrast Result Date: 03/09/2024 CLINICAL DATA:  Head trauma, minor  (Age >= 65y); Neck trauma (Age >= 65y). Fall. EXAM: CT HEAD WITHOUT CONTRAST CT CERVICAL SPINE WITHOUT CONTRAST TECHNIQUE: Multidetector CT imaging of the head and cervical spine was performed following the standard protocol without intravenous contrast. Multiplanar CT image reconstructions of the cervical spine were also generated. RADIATION DOSE REDUCTION: This exam was performed according to the departmental dose-optimization program  which includes automated exposure control, adjustment of the mA and/or kV according to patient size and/or use of iterative reconstruction technique. COMPARISON:  CT head 03/24/2023.  MRI cervical spine 04/29/2022. FINDINGS: CT HEAD FINDINGS Brain: There is no evidence of an acute infarct, intracranial hemorrhage, mass, midline shift, or extra-axial fluid collection. Confluent cerebral white matter hypodensities are unchanged and nonspecific but compatible with severe chronic small vessel ischemic disease. Mild cerebral atrophy is considered to be within normal limits for age. Vascular: Calcified atherosclerosis at the skull base. No hyperdense vessel. Skull: No acute fracture or suspicious lesion. Sinuses/Orbits: Paranasal sinuses and mastoid air cells are clear. Bilateral cataract extraction. Other: None. CT CERVICAL SPINE FINDINGS Alignment: Unchanged trace anterolisthesis of C4 on C5 and 4 mm anterolisthesis of C5 on C6. Skull base and vertebrae: No acute fracture or destructive lesion. Soft tissues and spinal canal: No prevertebral fluid or swelling. No visible canal hematoma. Disc levels: Moderate to severe disc degeneration at C6-7 and mild to moderate degeneration at C5-6. Moderately advanced multilevel facet arthrosis, with facet ankylosis on the left at C4-5 and on the right at C5-6. Moderate right neural foraminal stenosis at C5-6 and C6-7. No evidence of high-grade spinal canal stenosis. Upper chest: No mass or consolidation in the included lung apices. Other: Prominent  atherosclerotic calcification at the carotid bifurcations. IMPRESSION: 1. No evidence of acute intracranial abnormality. 2. Severe chronic small vessel ischemic disease. 3. No acute cervical spine fracture or traumatic malalignment. Electronically Signed   By: Aundra Lee M.D.   On: 03/09/2024 14:28   DG Hip Unilat With Pelvis 2-3 Views Right Result Date: 03/09/2024 CLINICAL DATA:  Marvell Slider EXAM: DG HIP (WITH OR WITHOUT PELVIS) 2-3V RIGHT COMPARISON:  03/24/2023 FINDINGS: There is no evidence of hip fracture or dislocation. There is no evidence of arthropathy or other focal bone abnormality. Extensive femoral arterial calcifications. IMPRESSION: Negative. Electronically Signed   By: Nicoletta Barrier M.D.   On: 03/09/2024 13:18   DG Chest 1 View Result Date: 03/09/2024 CLINICAL DATA:  Marvell Slider EXAM: CHEST  1 VIEW COMPARISON:  12/07/2021 FINDINGS: Prominent perihilar interstitial markings left greater than right. No confluent airspace disease. No pneumothorax. Heart size upper limits normal. Aortic Atherosclerosis (ICD10-170.0). Post CABG. IMPRESSION: Prominent perihilar interstitial markings left greater than right. Electronically Signed   By: Nicoletta Barrier M.D.   On: 03/09/2024 13:17   DG Shoulder Right Result Date: 03/09/2024 CLINICAL DATA:  Marvell Slider EXAM: RIGHT SHOULDER - 2+ VIEW COMPARISON:  12/09/2021 FINDINGS: No fracture or dislocation.  Mild diffuse osteopenia.  Post CABG. IMPRESSION: No acute findings. Electronically Signed   By: Nicoletta Barrier M.D.   On: 03/09/2024 13:16        Scheduled Meds:  ascorbic acid  1,000 mg Oral Daily   aspirin EC  81 mg Oral Daily   atorvastatin  40 mg Oral q1800   carbidopa-levodopa  2 tablet Oral Q breakfast   And   carbidopa-levodopa  1 tablet Oral 2 times per day   cholecalciferol  1,000 Units Oral Daily   enoxaparin (LOVENOX) injection  30 mg Subcutaneous Q24H   feeding supplement  237 mL Oral TID BM   multivitamin with minerals  1 tablet Oral Daily   sodium chloride  flush  3 mL Intravenous Q12H   Continuous Infusions:  meropenem (MERREM) IV Stopped (03/10/24 0457)     LOS: 0 days     Raymonde Calico, MD [facility]   If 7PM-7AM, please contact night-coverage [email or web address] Password  [email or web address] 03/10/2024, 12:11 PM

## 2024-03-10 NOTE — Care Management Obs Status (Signed)
 MEDICARE OBSERVATION STATUS NOTIFICATION   Patient Details  Name: Kimberly Scott MRN: [id number] Date of Birth: 12-10-1927   Medicare Observation Status Notification Given:  Yes    Areta Beer, RN 03/10/2024, 11:45 AM

## 2024-03-10 NOTE — Evaluation (Signed)
 Occupational Therapy Evaluation Patient Details Name: Kimberly Scott MRN: [id number] DOB: [birth year] Today's Date: 03/10/2024   History of Present Illness   Patient is a 89 year old female with ground level fall, UTI. History of parkinson's disease, CAD s/p CABG, TIA, mild Alzheimer's dementia, hypertension, hyperlipidemia     Clinical Impressions Pt was seen for OT evaluation and co-tx with PT this date. Prior to hospital admission, pt was living alone with assist for IADL and bathing from family. Pt denies need for assist with medication mgt and meal prep but daughter clarifies that she does. Pt denies falls, does not mention fall that led to this admission. Pt presents to acute OT demonstrating impaired ADL performance and functional mobility 2/2 impaired cognition, decreased strength, balance, and BLE sore/tenderness (See OT problem list for additional functional deficits). Pt currently requires MOD A +2 for bed mobility and standing, MIN A +2 for safety with ADL mobility using RW, and set up and supv for grooming tasks, and MIN A for seated UB dressing.  Pt would benefit from skilled OT services to address noted impairments and functional limitations (see below for any additional details) in order to maximize safety and independence while minimizing falls risk and caregiver burden.     If plan is discharge home, recommend the following:   A lot of help with walking and/or transfers;A lot of help with bathing/dressing/bathroom;Assistance with cooking/housework;Assist for transportation;Direct supervision/assist for financial management;Direct supervision/assist for medications management;Supervision due to cognitive status;Help with stairs or ramp for entrance     Functional Status Assessment   Patient has had a recent decline in their functional status and demonstrates the ability to make significant improvements in function in a reasonable and predictable amount of time.      Equipment Recommendations   Other (comment) (defer)     Recommendations for Other Services         Precautions/Restrictions   Precautions Precautions: Fall Recall of Precautions/Restrictions: Impaired Restrictions Weight Bearing Restrictions Per Provider Order: No     Mobility Bed Mobility Overal bed mobility: Needs Assistance Bed Mobility: Supine to Sit, Sit to Supine     Supine to sit: Mod assist, +2 for physical assistance Sit to supine: Mod assist, +2 for physical assistance   General bed mobility comments: cues for technique.    Transfers Overall transfer level: Needs assistance Equipment used: Rolling walker (2 wheels) Transfers: Sit to/from Stand Sit to Stand: Mod assist, +2 physical assistance           General transfer comment: verbal cues for technique. +2 pesron assistance required for standing      Balance Overall balance assessment: Needs assistance Sitting-balance support: Feet supported Sitting balance-Leahy Scale: Fair Sitting balance - Comments: fair once feet are supported on the floor   Standing balance support: Bilateral upper extremity supported, No upper extremity supported Standing balance-Leahy Scale: Poor Standing balance comment: external support with heavy reliance on rolling walker                           ADL either performed or assessed with clinical judgement   ADL Overall ADL's : Needs assistance/impaired     Grooming: Sitting;Set up;Supervision/safety;Wash/dry face;Brushing hair           Upper Body Dressing : Sitting;Minimal assistance   Lower Body Dressing: Sit to/from stand;Minimal assistance;Moderate assistance               Functional mobility during ADLs: Rolling  walker (2 wheels);Minimal assistance;+2 for physical assistance;+2 for safety/equipment       Vision         Perception         Praxis         Pertinent Vitals/Pain Pain Assessment Pain Assessment:  Faces Faces Pain Scale: Hurts a little bit Pain Location: left leg more than right leg Pain Descriptors / Indicators: Discomfort, Sore Pain Intervention(s): Limited activity within patient's tolerance, Monitored during session, Repositioned     Extremity/Trunk Assessment Upper Extremity Assessment Upper Extremity Assessment: Generalized weakness   Lower Extremity Assessment Lower Extremity Assessment: Generalized weakness   Cervical / Trunk Assessment Cervical / Trunk Assessment: Kyphotic   Communication Communication Communication: No apparent difficulties   Cognition Arousal: Alert Behavior During Therapy: WFL for tasks assessed/performed Cognition: History of cognitive impairments                               Following commands: Impaired Following commands impaired: Follows one step commands with increased time     Cueing  General Comments   Cueing Techniques: Verbal cues;Tactile cues      Exercises     Shoulder Instructions      Home Living Family/patient expects to be discharged to:: Private residence Living Arrangements: Alone Available Help at Discharge: Family;Available PRN/intermittently Type of Home: House Home Access: Stairs to enter [employer] of Steps: 3 Entrance Stairs-Rails: Right Home Layout: One level     Bathroom Shower/Tub: Tub/shower unit         Home Equipment: Agricultural consultant (2 wheels);Tub bench   Additional Comments: cameras around the house and outside the home. daughter and other family check in frequently. daughter takes patient out of the house frequently also and brings her to her home for showering      Prior Functioning/Environment Prior Level of Function : Needs assist;History of Falls (last six months)             Mobility Comments: Mod I for ambulation using rolling walker. History of falls. ADLs Comments: assistance for bathing, showering, medications, meals.    OT Problem List:  Decreased strength;Pain;Decreased cognition;Decreased activity tolerance;Decreased safety awareness;Decreased knowledge of use of DME or AE;Impaired balance (sitting and/or standing)   OT Treatment/Interventions: Self-care/ADL training;Therapeutic exercise;Therapeutic activities;Cognitive remediation/compensation;DME and/or AE instruction;Patient/family education;Balance training      OT Goals(Current goals can be found in the care plan section)   Acute Rehab OT Goals Patient Stated Goal: get better OT Goal Formulation: With patient/family Time For Goal Achievement: 03/24/24 Potential to Achieve Goals: Fair ADL Goals Pt Will Perform Upper Body Dressing: sitting;with modified independence Pt Will Perform Lower Body Dressing: sitting/lateral leans;sit to/from stand;with supervision Pt Will Transfer to Toilet: ambulating;with supervision;regular height toilet (LRAD) Pt Will Perform Toileting - Clothing Manipulation and hygiene: with modified independence Additional ADL Goal #1: Pt will complete morning grooming routine with supv in standing/sitting without LOB or VC for safety required.   OT Frequency:  Min 2X/week    Co-evaluation PT/OT/SLP Co-Evaluation/Treatment: Yes Reason for Co-Treatment: Complexity of the patient's impairments (multi-system involvement);For patient/therapist safety PT goals addressed during session: Mobility/safety with mobility OT goals addressed during session: ADL's and self-care      AM-PAC OT "6 Clicks" Daily Activity     Outcome Measure Help from another person eating meals?: None Help from another person taking care of personal grooming?: A Little Help from another person toileting, which includes using toliet, bedpan,  or urinal?: A Lot Help from another person bathing (including washing, rinsing, drying)?: A Lot Help from another person to put on and taking off regular upper body clothing?: A Little Help from another person to put on and taking off  regular lower body clothing?: A Lot 6 Click Score: 16   End of Session Equipment Utilized During Treatment: Gait belt;Rolling walker (2 wheels)  Activity Tolerance: Patient tolerated treatment well Patient left: in bed;with call bell/phone within reach;with bed alarm set;with family/visitor present  OT Visit Diagnosis: Other abnormalities of gait and mobility (R26.89);History of falling (Z91.81);Muscle weakness (generalized) (M62.81)                Time: 9562-1308 OT Time Calculation (min): 24 min Charges:  OT General Charges $OT Visit: 1 Visit OT Evaluation $OT Eval Moderate Complexity: 1 Mod OT Treatments $Self Care/Home Management : 8-22 mins  Berenda Breaker., MPH, MS, OTR/L ascom [phone] 03/10/24, 3:33 PM

## 2024-03-10 NOTE — Evaluation (Signed)
 Physical Therapy Evaluation Patient Details Name: Kimberly Scott MRN: [id number] DOB: 27-Nov-1928 Today's Date: 03/10/2024  History of Present Illness  Patient is a 89 year old female with ground level fall, UTI. History of parkinson's disease, CAD s/p CABG, TIA, mild Alzheimer's dementia, hypertension, hyperlipidemia  Clinical Impression  The patient is agreeable to PT evaluation. Supportive daughter at the bedside. The patient lives alone but has supportive family that checks in on her frequently. She needs assistance for medication, meals, ADLs. She is ambulatory with a 4 wheeled walker at home.  Today the patient is not at her baseline level of functional independence. She required +2 person assistance for bed mobility and for standing. She complains of mild pain in both legs, the left more than the right. She was able to walk around 66ft with the rolling walker with physical assistance for steadying and for advancement of the walker. Anticipate patient may required increased support including physical assistance with mobility after this hospital stay. Consider rehabilitation < 3 hours/day if caregiver support is not available. PT will continue to follow.       If plan is discharge home, recommend the following: A lot of help with walking and/or transfers;A lot of help with bathing/dressing/bathroom;Assistance with cooking/housework;Help with stairs or ramp for entrance;Supervision due to cognitive status;Direct supervision/assist for financial management;Direct supervision/assist for medications management   Can travel by private vehicle   No    Equipment Recommendations None recommended by PT  Recommendations for Other Services       Functional Status Assessment Patient has had a recent decline in their functional status and demonstrates the ability to make significant improvements in function in a reasonable and predictable amount of time.     Precautions / Restrictions  Precautions Precautions: Fall Recall of Precautions/Restrictions: Impaired Restrictions Weight Bearing Restrictions Per Provider Order: No      Mobility  Bed Mobility Overal bed mobility: Needs Assistance Bed Mobility: Supine to Sit, Sit to Supine     Supine to sit: Mod assist, +2 for physical assistance Sit to supine: Mod assist, +2 for physical assistance   General bed mobility comments: cues for technique.    Transfers Overall transfer level: Needs assistance Equipment used: Rolling walker (2 wheels) Transfers: Sit to/from Stand Sit to Stand: Mod assist, +2 physical assistance           General transfer comment: verbal cues for technique. +2 pesron assistance required for standing    Ambulation/Gait Ambulation/Gait assistance: Min assist Gait Distance (Feet): 20 Feet Assistive device: Rolling walker (2 wheels) Gait Pattern/deviations: Shuffle, Trunk flexed, Decreased stride length Gait velocity: decreased     General Gait Details: cues for technique. physical assistance required for advancement of rolling walker and with turns. cues for foot clearance bilaterally  Stairs            Wheelchair Mobility     Tilt Bed    Modified Rankin (Stroke Patients Only)       Balance Overall balance assessment: Needs assistance Sitting-balance support: Feet supported Sitting balance-Leahy Scale: Fair Sitting balance - Comments: fair once feet are supported on the floor   Standing balance support: Bilateral upper extremity supported, No upper extremity supported Standing balance-Leahy Scale: Poor Standing balance comment: external support with heavy reliance on rolling walker                             Pertinent Vitals/Pain Pain Assessment Pain Assessment: Faces Faces  Pain Scale: Hurts a little bit Pain Location: left leg more than right leg Pain Descriptors / Indicators: Discomfort Pain Intervention(s): Limited activity within patient's  tolerance, Monitored during session, Repositioned    Home Living Family/patient expects to be discharged to:: Private residence Living Arrangements: Alone Available Help at Discharge: Family;Available PRN/intermittently Type of Home: House Home Access: Stairs to enter Entrance Stairs-Rails: Right Entrance Stairs-Number of Steps: 3   Home Layout: One level Home Equipment: Agricultural consultant (2 wheels);Tub bench Additional Comments: cameras around the house and outside the home. daughter and other family check in frequently. daughter takes patient out of the house frequently also and brings her to her home for showering    Prior Function Prior Level of Function : Needs assist;History of Falls (last six months)             Mobility Comments: Mod I for ambulation using rolling walker. History of falls. ADLs Comments: assistance for bathing, showering, medications, meals.     Extremity/Trunk Assessment   Upper Extremity Assessment Upper Extremity Assessment: Defer to OT evaluation    Lower Extremity Assessment Lower Extremity Assessment: Generalized weakness    Cervical / Trunk Assessment Cervical / Trunk Assessment: Kyphotic  Communication   Communication Communication: No apparent difficulties    Cognition Arousal: Alert Behavior During Therapy: WFL for tasks assessed/performed   PT - Cognitive impairments: History of cognitive impairments                       PT - Cognition Comments: Patient is able to follow single step commands with increased time. History of dementia Following commands: Impaired Following commands impaired: Follows one step commands with increased time     Cueing Cueing Techniques: Verbal cues, Tactile cues     General Comments      Exercises     Assessment/Plan    PT Assessment Patient needs continued PT services  PT Problem List Decreased strength;Decreased range of motion;Decreased activity tolerance;Decreased  balance;Decreased mobility;Decreased safety awareness       PT Treatment Interventions DME instruction;Gait training;Stair training;Functional mobility training;Therapeutic activities;Therapeutic exercise;Balance training;Neuromuscular re-education;Cognitive remediation;Patient/family education    PT Goals (Current goals can be found in the Care Plan section)  Acute Rehab PT Goals Patient Stated Goal: to return home PT Goal Formulation: With patient Time For Goal Achievement: 03/24/24 Potential to Achieve Goals: Fair    Frequency Min 2X/week     Co-evaluation PT/OT/SLP Co-Evaluation/Treatment: Yes Reason for Co-Treatment: Complexity of the patient's impairments (multi-system involvement);For patient/therapist safety PT goals addressed during session: Mobility/safety with mobility OT goals addressed during session: ADL's and self-care       AM-PAC PT "6 Clicks" Mobility  Outcome Measure Help needed turning from your back to your side while in a flat bed without using bedrails?: A Little Help needed moving from lying on your back to sitting on the side of a flat bed without using bedrails?: A Lot Help needed moving to and from a bed to a chair (including a wheelchair)?: Total Help needed standing up from a chair using your arms (e.g., wheelchair or bedside chair)?: Total Help needed to walk in hospital room?: Total Help needed climbing 3-5 steps with a railing? : Total 6 Click Score: 9    End of Session   Activity Tolerance: Patient tolerated treatment well Patient left: in bed;with call bell/phone within reach Nurse Communication: Mobility status PT Visit Diagnosis: Unsteadiness on feet (R26.81);Muscle weakness (generalized) (M62.81)    Time: 1610-9604 PT  Time Calculation (min) (ACUTE ONLY): 27 min   Charges:   PT Evaluation $PT Eval Moderate Complexity: 1 Mod PT Treatments $Therapeutic Activity: 8-22 mins PT General Charges $$ ACUTE PT VISIT: 1 Visit          Ozie Bo, PT, MPT   [name] 03/10/2024, 11:42 AM

## 2024-03-11 DIAGNOSIS — B9620 Unspecified Escherichia coli [E. coli] as the cause of diseases classified elsewhere: Secondary | ICD-10-CM

## 2024-03-11 DIAGNOSIS — N390 Urinary tract infection, site not specified: Secondary | ICD-10-CM | POA: Diagnosis not present

## 2024-03-11 DIAGNOSIS — Z1612 Extended spectrum beta lactamase (ESBL) resistance: Secondary | ICD-10-CM

## 2024-03-11 LAB — CBC
HCT: 32.5 % — ABNORMAL LOW (ref 36.0–46.0)
Hemoglobin: 10.9 g/dL — ABNORMAL LOW (ref 12.0–15.0)
MCH: 31.9 pg (ref 26.0–34.0)
MCHC: 33.5 g/dL (ref 30.0–36.0)
MCV: 95 fL (ref 80.0–100.0)
Platelets: 200 10*3/uL (ref 150–400)
RBC: 3.42 MIL/uL — ABNORMAL LOW (ref 3.87–5.11)
RDW: 13.5 % (ref 11.5–15.5)
WBC: 6.8 10*3/uL (ref 4.0–10.5)
nRBC: 0 % (ref 0.0–0.2)

## 2024-03-11 LAB — BASIC METABOLIC PANEL WITH GFR
Anion gap: 5 (ref 5–15)
BUN: 24 mg/dL — ABNORMAL HIGH (ref 8–23)
CO2: 23 mmol/L (ref 22–32)
Calcium: 8.5 mg/dL — ABNORMAL LOW (ref 8.9–10.3)
Chloride: 105 mmol/L (ref 98–111)
Creatinine, Ser: 1.17 mg/dL — ABNORMAL HIGH (ref 0.44–1.00)
GFR, Estimated: 44 mL/min — ABNORMAL LOW (ref >60)
Glucose, Bld: 114 mg/dL — ABNORMAL HIGH (ref 70–99)
Potassium: 4.7 mmol/L (ref 3.5–5.1)
Sodium: 133 mmol/L — ABNORMAL LOW (ref 135–145)

## 2024-03-11 LAB — URINE CULTURE: Culture: >=100000 — AB

## 2024-03-11 NOTE — Consult Note (Signed)
 NAME: Kimberly Scott  DOB: 03-14-1928  MRN: [id number]  Date/Time: 03/11/2024 7:19 PM  REQUESTING PROVIDER: Sari Cunning Subjective:  REASON FOR CONSULT: ESBL ecoli bacteremia ?pt is a limited historian Kimberly Scott is a 89 y.o. female with a history of CABG, Rt TKA presents from home after a fall. As per patient she was using her walker and her cane fell down and she stepped on it by mistake and it sprung up and hit her and she fell to the floor taken by surprise- She called her daughter- And  EMS was called and she was brought to the ED Pt lives on her won She denies any fever, chills, dysuria, weakness, N/V  03/09/24 11:27  BP 151/58 !  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 and in April 2024 when she was in the hospital after a fall and this bacteria was in the urine- thought to be colonizing and not the cause of the fall then- There was concern for Atrophic vaginitis    Past Medical History:  Diagnosis Date   Anginal pain (HCC)    Arthritis    Cancer (HCC)    FACIAL SKIN CANCER   Coronary artery disease    Headache(784.0)    Heart murmur    Hypertension    Pneumonia    hx of PNA    Past Surgical History:  Procedure Laterality Date   ABDOMINAL HYSTERECTOMY     APPENDECTOMY     cataracts     CORONARY ARTERY BYPASS GRAFT  10/01/2012   Procedure: CORONARY ARTERY BYPASS GRAFTING (CABG);  Surgeon: Norita Beauvais, MD;  Location: [facility] OR;  Service: Open Heart Surgery;  Laterality: N/A;  times five using Left Internal Mammary Artery    ENDOVEIN HARVEST OF GREATER SAPHENOUS VEIN  10/01/2012   Procedure: ENDOVEIN HARVEST OF GREATER SAPHENOUS  VEIN;  Surgeon: Norita Beauvais, MD;  Location: [facility] OR;  Service: Open Heart Surgery;  Laterality: Bilateral;   ESOPHAGOGASTRODUODENOSCOPY N/A 06/30/2021   Procedure: ESOPHAGOGASTRODUODENOSCOPY (EGD);  Surgeon: Toledo, Alphonsus Jeans, MD;  Location: [facility] ENDOSCOPY;  Service: Gastroenterology;  Laterality: N/A;   KNEE SURGERY     TONSILLECTOMY     TOTAL KNEE ARTHROPLASTY     right knee    Social History   Socioeconomic History   Marital status: Married    Spouse name: Not on file   Number of children: Not on file   Years of education: Not on file   Highest education level: Not on file  Occupational History   Not on file  Tobacco Use   Smoking status: Never   Smokeless tobacco: Never  Vaping Use   Vaping status: Never Used  Substance and Sexual Activity   Alcohol use: No   Drug use: No   Sexual activity: Never  Other Topics Concern   Not on file  Social History Narrative   Not on file   Social Drivers of Health   Financial Resource Strain: Low Risk  (02/26/2024)   [facility]   Overall Financial Resource Strain (CARDIA)    Difficulty of Paying Living Expenses: Not hard at all  Food Insecurity: No Food Insecurity (03/10/2024)   Hunger Vital Sign    Worried About Running Out of Food in the Last Year: Never true    Ran Out of Food in the Last Year: Never true  Transportation Needs: No Transportation Needs (03/10/2024)   PRAPARE - Administrator, Civil Service (Medical): No    Lack of Transportation (Non-Medical): No  Physical Activity: Unknown (07/31/2019)   Exercise Vital Sign    Days of Exercise per Week: Patient declined    Minutes of Exercise per Session: Patient declined  Stress: Not on file  Social Connections: Patient Unable To Answer (03/10/2024)   Social Connection and Isolation Panel [NHANES]    Frequency of Communication with Friends and Family: Patient unable to answer    Frequency of Social Gatherings with Friends and  Family: Patient unable to answer    Attends Religious Services: Patient unable to answer    Active Member of Clubs or Organizations: Patient unable to answer    Attends Banker Meetings: Patient unable to answer    Marital Status: Patient unable to answer  Intimate Partner Violence: Not At Risk (03/10/2024)   Humiliation, Afraid, Rape, and Kick questionnaire    Fear of Current or Ex-Partner: No    Emotionally Abused: No    Physically Abused: No    Sexually Abused: No    History reviewed. No pertinent family history. Allergies  Allergen Reactions   Bacitracin-Neomycin-Polymyxin Swelling and Rash    Blisters Blisters    Ace Inhibitors     Other reaction(s): Cough   Omega 3  [Fish Oil] Other (See Comments)   Neosporin [Neomycin-Bacitracin Zn-Polymyx] Rash   Tape Rash    Blisters   I? Current Facility-Administered Medications  Medication Dose Route Frequency Provider Last Rate Last Admin   acetaminophen (TYLENOL) tablet 650 mg  650 mg Oral Q6H PRN Basaraba, Iulia, MD   650 mg at 03/10/24 0550   Or   acetaminophen (TYLENOL) suppository 650 mg  650 mg Rectal Q6H PRN Avi Body, MD       ascorbic acid (VITAMIN C) tablet 1,000 mg  1,000 mg Oral Daily Basaraba, Iulia, MD   1,000 mg at 03/11/24 1610   aspirin EC tablet 81 mg  81 mg Oral Daily Avi Body, MD   81 mg at 03/11/24 0917   atorvastatin (LIPITOR) tablet 40 mg  40 mg Oral q1800 Avi Body, MD   40 mg at 03/11/24 1647   carbidopa-levodopa (SINEMET IR) 25-100 MG per tablet immediate release 2 tablet  2 tablet Oral Q breakfast Avi Body, MD   2 tablet at 03/11/24 9604   And   carbidopa-levodopa (SINEMET IR) 25-100 MG per tablet immediate release 1 tablet  1 tablet Oral 2 times per day Avi Body, MD   1 tablet at 03/11/24 1646   cholecalciferol (VITAMIN D3) 25 MCG (1000 UNIT) tablet 1,000 Units  1,000 Units Oral Daily Avi Body, MD   1,000 Units at 03/11/24 0917   enoxaparin (LOVENOX)  injection 30 mg  30 mg Subcutaneous Q24H Avi Body, MD   30 mg at 03/11/24 1647   feeding supplement (ENSURE ENLIVE / ENSURE PLUS) liquid 237 mL  237 mL Oral TID BM Avi Body, MD   237 mL at 03/11/24 1300   meropenem (MERREM) 1 g in sodium chloride 0.9 % 100  mL IVPB  1 g Intravenous Q12H Ananias Balls, RPH 200 mL/hr at 03/11/24 1652 1 g at 03/11/24 1652   multivitamin with minerals tablet 1 tablet  1 tablet Oral Daily Avi Body, MD   1 tablet at 03/11/24 0917   ondansetron (ZOFRAN) tablet 4 mg  4 mg Oral Q6H PRN Avi Body, MD       Or   ondansetron (ZOFRAN) injection 4 mg  4 mg Intravenous Q6H PRN Basaraba, Iulia, MD       polyethylene glycol (MIRALAX / GLYCOLAX) packet 17 g  17 g Oral Daily PRN Avi Body, MD       sodium chloride flush (NS) 0.9 % injection 3 mL  3 mL Intravenous Q12H Avi Body, MD   3 mL at 03/11/24 1610     Abtx:  Anti-infectives (From admission, onward)    Start     Dose/Rate Route Frequency Ordered Stop   03/10/24 0500  meropenem (MERREM) 1 g in sodium chloride 0.9 % 100 mL IVPB        1 g 200 mL/hr over 30 Minutes Intravenous Every 12 hours 03/09/24 2030     03/09/24 1600  cefTRIAXone (ROCEPHIN) 2 g in sodium chloride 0.9 % 100 mL IVPB  Status:  Discontinued        2 g 200 mL/hr over 30 Minutes Intravenous Once 03/09/24 1554 03/09/24 1556   03/09/24 1600  meropenem (MERREM) 1 g in sodium chloride 0.9 % 100 mL IVPB        1 g 200 mL/hr over 30 Minutes Intravenous  Once 03/09/24 1557 03/09/24 1752       REVIEW OF SYSTEMS:  Const: negative fever, negative chills, negative weight loss Eyes: negative diplopia or visual changes, negative eye pain ENT: negative coryza, negative sore throat Resp: negative cough, hemoptysis, dyspnea Cards: negative for chest pain, palpitations, lower extremity edema GU: negative for frequency, dysuria and hematuria GI: Negative for abdominal pain, diarrhea, bleeding, constipation Skin: negative  for rash and pruritus Heme: negative for easy bruising and gum/nose bleeding MS: some weakness Neurolo:negative for headaches, dizziness, vertigo, memory problems  Psych: negative for feelings of anxiety, depression  Endocrine: negative for thyroid, diabetes Allergy/Immunology- as above ?  Objective:  VITALS:  BP (!) 127/50 (BP Location: Left Arm)   Pulse 79   Temp 98.1 F (36.7 C) (Oral)   Resp 20   Ht 5' (1.524 m)   Wt 70.4 kg   SpO2 96%   BMI 30.31 kg/m   PHYSICAL EXAM:  General: Alert, cooperative, no distress, young for her age- oreinted in place, person, month Some memory issues Head: Normocephalic, without obvious abnormality, atraumatic. Eyes: Conjunctivae clear, anicteric sclerae. Pupils are equal ENT Nares normal. No drainage or sinus tenderness. Lips, mucosa, and tongue normal. No Thrush Neck: Supple, symmetrical, no adenopathy, thyroid: non tender no carotid bruit and no JVD. Back: No CVA tenderness. Lungs: Clear to auscultation bilaterally. No Wheezing or Rhonchi. No rales. Heart: s1s2. Abdomen: Soft, non-tender,not distended. Bowel sounds normal. No masses Extremities: swelling of legs Rt TKA scar Let knee scar ( smaller) Skin: No rashes or lesions. Or bruising Lymph: Cervical, supraclavicular normal. Neurologic: did not examine in detail Pertinent Labs Lab Results CBC    Component Value Date/Time   WBC 6.8 03/11/2024 0532   RBC 3.42 (L) 03/11/2024 0532   HGB 10.9 (L) 03/11/2024 0532   HGB 11.2 (L) 06/04/2013 0910   HCT 32.5 (L) 03/11/2024 0532   HCT 33.0 (L) 06/05/2013  0649   PLT 200 03/11/2024 0532   PLT 213 06/04/2013 0910   MCV 95.0 03/11/2024 0532   MCV 94 06/04/2013 0910   MCH 31.9 03/11/2024 0532   MCHC 33.5 03/11/2024 0532   RDW 13.5 03/11/2024 0532   RDW 13.7 06/04/2013 0910   LYMPHSABS 1.8 12/10/2021 0543   LYMPHSABS 2.1 06/04/2013 0910   MONOABS 0.7 12/10/2021 0543   MONOABS 0.9 06/04/2013 0910   EOSABS 0.0 12/10/2021 0543    EOSABS 0.3 06/04/2013 0910   BASOSABS 0.0 12/10/2021 0543   BASOSABS 0.1 06/04/2013 0910       Latest Ref Rng & Units 03/11/2024    5:32 AM 03/10/2024    1:54 AM 03/09/2024   12:39 PM  CMP  Glucose 70 - 99 mg/dL 161  096  045   BUN 8 - 23 mg/dL 24  32  35   Creatinine 0.44 - 1.00 mg/dL 4.09  8.11  9.14   Sodium 135 - 145 mmol/L 133  137  136   Potassium 3.5 - 5.1 mmol/L 4.7  4.4  4.7   Chloride 98 - 111 mmol/L 105  108  104   CO2 22 - 32 mmol/L 23  23  23    Calcium 8.9 - 10.3 mg/dL 8.5  8.4  9.4   Total Protein 6.5 - 8.1 g/dL   7.3   Total Bilirubin 0.0 - 1.2 mg/dL   1.2   Alkaline Phos 38 - 126 U/L   50   AST 15 - 41 U/L   25   ALT 0 - 44 U/L   11       Microbiology: Recent Results (from the past 240 hours)  Urine Culture     Status: Abnormal   Collection Time: 03/09/24  3:34 PM   Specimen: Urine, Random  Result Value Ref Range Status   Specimen Description   Final    URINE, RANDOM Performed at [facility], [address], [place], [place] [address]    Special Requests   Final    NONE Reflexed from [id number] Performed at [facility], [address] [place], [place] [address]    Culture (A)  Final    >=100,000 COLONIES/mL ESCHERICHIA COLI Confirmed Extended Spectrum Beta-Lactamase Producer (ESBL).  In bloodstream infections from ESBL organisms, carbapenems are preferred over piperacillin/tazobactam. They are shown to have a lower risk of mortality.    Report Status 03/11/2024 FINAL  Final   Organism ID, Bacteria ESCHERICHIA COLI (A)  Final      Susceptibility   Escherichia coli - MIC*    AMPICILLIN >=32 RESISTANT Resistant     CEFAZOLIN >=64 RESISTANT Resistant     CEFEPIME 8 INTERMEDIATE Intermediate     CEFTRIAXONE >=64 RESISTANT Resistant     CIPROFLOXACIN <=0.25 SENSITIVE Sensitive     GENTAMICIN >=16 RESISTANT Resistant     IMIPENEM 0.5 SENSITIVE Sensitive     NITROFURANTOIN 32 SENSITIVE Sensitive     TRIMETH/SULFA >=320 RESISTANT  Resistant     AMPICILLIN/SULBACTAM >=32 RESISTANT Resistant     PIP/TAZO <=4 SENSITIVE Sensitive ug/mL    * >=100,000 COLONIES/mL ESCHERICHIA COLI  Blood culture (routine x 2)     Status: Abnormal (Preliminary result)   Collection Time: 03/09/24  4:36 PM   Specimen: BLOOD RIGHT ARM  Result Value Ref Range Status   Specimen Description   Final    BLOOD RIGHT ARM Performed at [facility], [address] [place], [place] [address]  Special Requests   Final    BOTTLES DRAWN AEROBIC AND ANAEROBIC Blood Culture results may not be optimal due to an inadequate volume of blood received in culture bottles Performed at [facility], [address] [place], [place] [address]    Culture  Setup Time   Final    IN BOTH AEROBIC AND ANAEROBIC BOTTLES GRAM NEGATIVE RODS CRITICAL RESULT CALLED TO, READ BACK BY AND VERIFIED WITH: JASON ROBBINS @ 0531 03/10/24 [facility]    Culture (A)  Final    ESCHERICHIA COLI SUSCEPTIBILITIES TO FOLLOW Performed at [facility], [address], [place], [place] [address]    Report Status PENDING  Incomplete  Blood culture (routine x 2)     Status: None (Preliminary result)   Collection Time: 03/09/24  4:36 PM   Specimen: BLOOD LEFT ARM  Result Value Ref Range Status   Specimen Description   Final    BLOOD LEFT ARM Performed at [facility], [address], [place], [place] [address]    Special Requests   Final    BOTTLES DRAWN AEROBIC AND ANAEROBIC Blood Culture results may not be optimal due to an inadequate volume of blood received in culture bottles Performed at [facility], [address] [place], [place] [address]    Culture  Setup Time   Final    IN BOTH AEROBIC AND ANAEROBIC BOTTLES GRAM NEGATIVE RODS CRITICAL RESULT CALLED TO, READ BACK BY AND VERIFIED WITH: JASON ROBBINS @ 0531 03/10/24 [facility] CRITICAL VALUE NOTED.  VALUE IS CONSISTENT WITH PREVIOUSLY REPORTED AND CALLED VALUE.    Culture   Final    GRAM  NEGATIVE RODS IDENTIFICATION TO FOLLOW Performed at [facility], [address], [place], [place] [address]    Report Status PENDING  Incomplete  Blood Culture ID Panel (Reflexed)     Status: Abnormal   Collection Time: 03/09/24  4:36 PM  Result Value Ref Range Status   Enterococcus faecalis NOT DETECTED NOT DETECTED Final   Enterococcus Faecium NOT DETECTED NOT DETECTED Final   Listeria monocytogenes NOT DETECTED NOT DETECTED Final   Staphylococcus species NOT DETECTED NOT DETECTED Final   Staphylococcus aureus (BCID) NOT DETECTED NOT DETECTED Final   Staphylococcus epidermidis NOT DETECTED NOT DETECTED Final   Staphylococcus lugdunensis NOT DETECTED NOT DETECTED Final   Streptococcus species NOT DETECTED NOT DETECTED Final   Streptococcus agalactiae NOT DETECTED NOT DETECTED Final   Streptococcus pneumoniae NOT DETECTED NOT DETECTED Final   Streptococcus pyogenes NOT DETECTED NOT DETECTED Final   A.calcoaceticus-baumannii NOT DETECTED NOT DETECTED Final   Bacteroides fragilis NOT DETECTED NOT DETECTED Final   Enterobacterales DETECTED (A) NOT DETECTED Final    Comment: Enterobacterales represent a large order of gram negative bacteria, not a single organism. CRITICAL RESULT CALLED TO, READ BACK BY AND VERIFIED WITH: JASON ROBBINS @ 0531 03/10/24 [facility]    Enterobacter cloacae complex NOT DETECTED NOT DETECTED Final   Escherichia coli DETECTED (A) NOT DETECTED Final    Comment: CRITICAL RESULT CALLED TO, READ BACK BY AND VERIFIED WITH: JASON ROBBINS @ 0531 03/10/24 [facility]    Klebsiella aerogenes NOT DETECTED NOT DETECTED Final   Klebsiella oxytoca NOT DETECTED NOT DETECTED Final   Klebsiella pneumoniae NOT DETECTED NOT DETECTED Final   Proteus species NOT DETECTED NOT DETECTED Final   Salmonella species NOT DETECTED NOT DETECTED Final   Serratia marcescens NOT DETECTED NOT DETECTED Final   Haemophilus influenzae NOT DETECTED NOT DETECTED Final  Neisseria meningitidis NOT  DETECTED NOT DETECTED Final   Pseudomonas aeruginosa NOT DETECTED NOT DETECTED Final   Stenotrophomonas maltophilia NOT DETECTED NOT DETECTED Final   Candida albicans NOT DETECTED NOT DETECTED Final   Candida auris NOT DETECTED NOT DETECTED Final   Candida glabrata NOT DETECTED NOT DETECTED Final   Candida krusei NOT DETECTED NOT DETECTED Final   Candida parapsilosis NOT DETECTED NOT DETECTED Final   Candida tropicalis NOT DETECTED NOT DETECTED Final   Cryptococcus neoformans/gattii NOT DETECTED NOT DETECTED Final   CTX-M ESBL DETECTED (A) NOT DETECTED Final    Comment: CRITICAL RESULT CALLED TO, READ BACK BY AND VERIFIED WITH: JASON ROBBINS @ 0531 03/10/24 [facility] (NOTE) Extended spectrum beta-lactamase detected. Recommend a carbapenem as initial therapy.      Carbapenem resistance IMP NOT DETECTED NOT DETECTED Final   Carbapenem resistance KPC NOT DETECTED NOT DETECTED Final   Carbapenem resistance NDM NOT DETECTED NOT DETECTED Final   Carbapenem resist OXA 48 LIKE NOT DETECTED NOT DETECTED Final   Carbapenem resistance VIM NOT DETECTED NOT DETECTED Final    Comment: Performed at [facility], [address] [place], [place] [address]    IMAGING RESULTS: Ct abdomen and pelvis No renal stome No hydronephrosis Bladder distension noted I have personally reviewed the films ? Impression/Recommendation ?ESBL ecoli bacteremia and esbl ecoli in urine. 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 ? _I have personally spent  -60--minutes involved in face-to-face and non-face-to-face activities for this patient on the day of the visit. Professional time spent includes the following activities: Preparing to see the patient (review of tests), Obtaining and/or reviewing separately obtained history (admission/discharge record), Performing a medically appropriate examination and/or evaluation , Ordering medications/tests/procedures, referring and communicating with other health care professionals, Documenting clinical information in the EMR, Independently interpreting results (not separately reported), Communicating results to the patient/family/caregiver, Counseling and educating the patient/family/caregiver and Care coordination (not separately reported).   This involved complex antimicrobial management.

## 2024-03-11 NOTE — Progress Notes (Signed)
 PROGRESS NOTE    Kimberly Scott  ZOX:[id number] DOB: 1928/03/14 DOA: 03/09/2024 PCP: Lynnea Ferrier, MD    Brief Narrative:   From admission h and p  Kimberly Scott is a 89 y.o. female with medical history significant of Parkinson's disease, CAD s/p CABG (2013), TIA, mild Alzheimer's dementia, hypertension, hyperlipidemia, who presents to the ED due to ground-level fall.   History provided by both daughter and Kimberly Scott at bedside.  Kimberly Scott states that over the past few days, she has been feeling increasingly weak with a poor appetite.  Her daughter states that a few days ago, she noticed low blood pressure and called her PCP, who recommended she cut the telmisartan dose in half.  The day after doing so, blood pressure returned back to normal.  Last night, patient states that she was trying to put her cane down next to her walker when she lost her footing and somehow slipped.  The cane ended up hitting her in the head but she denies any loss of consciousness.  At this time, she denies any nausea, vomiting, abdominal pain, dysuria, hematuria, urinary frequency or urgency.  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 No murmur. Hemodynamically stable. CT abdomen/pelvis nothing acute - continue meropenem, await sensitivities - repeat blood cultures ordered - bladder scan (bladder distended on CT)  # Falls Likely weak from bacteremia. CT head/nec, hips, shoulder all neg, no new pains - PT consulted, advises snf, patient declining that thus far  # Parkinson's disease # Dementia Stable - home  sinemet  # Hx TIA - home asa, statin  # HTN Here bp wnl - holding home telmisartan   DVT prophylaxis: lovenox Code Status: full Family Communication: daughter updated telephonically 4/14  Level of care: Med-Surg Status is: Inpatient Remains inpatient appropriate because: severity of illness    Consultants:  ID  Procedures: none  Antimicrobials:  meropenem    Subjective: Feeling fine, BM today, tolerating diet  Objective: Vitals:   03/10/24 1751 03/10/24 2209 03/11/24 0628 03/11/24 0740  BP: (!) 152/55 115/73 (!) 142/54 (!) 124/52  Pulse: 91 (!) 58 (!) 52 87  Resp: 16   16  Temp: 97.9 F (36.6 C) 98.3 F (36.8 C) 98.6 F (37 C) 98 F (36.7 C)  TempSrc:      SpO2: 95% 96% 93% 97%  Weight:      Height:        Intake/Output Summary (Last 24 hours) at 03/11/2024 1435 Last data filed at 03/11/2024 1100 Gross per 24 hour  Intake 340 ml  Output --  Net 340 ml   Filed Weights   03/09/24 1128 03/10/24 0637  Weight: 67.2 kg 70.4 kg    Examination:  General exam: Appears calm and comfortable  Respiratory system: Clear to auscultation. Respiratory effort normal. Cardiovascular system: S1 & S2 heard, RRR.   Gastrointestinal system: Abdomen is nondistended, soft and nontender.   Central nervous system: Alert and oriented. No focal neurological deficits. Extremities: Symmetric 5 x 5 power. Skin: No rashes, lesions or ulcers Psychiatry: Judgement and insight appear normal. Mood &  affect appropriate.     Data Reviewed: I have personally reviewed following labs and imaging studies  CBC: Recent Labs  Lab 03/09/24 1239 03/10/24 0154 03/11/24 0532  WBC 9.5 9.3 6.8  HGB 11.6* 10.4* 10.9*  HCT 35.0* 31.9* 32.5*  MCV 96.2 98.5 95.0  PLT 228 197 200   Basic Metabolic Panel: Recent Labs  Lab 03/09/24 1239 03/10/24 0154 03/11/24 0532  NA 136 137 133*  K 4.7 4.4 4.7  CL 104 108 105  CO2 23 23 23   GLUCOSE 104* 146* 114*  BUN 35* 32* 24*  CREATININE  1.39* 1.25* 1.17*  CALCIUM 9.4 8.4* 8.5*  MG  --  2.0  --    GFR: Estimated Creatinine Clearance: 26.3 mL/min (A) (by C-G formula based on SCr of 1.17 mg/dL (H)). Liver Function Tests: Recent Labs  Lab 03/09/24 1239  AST 25  ALT 11  ALKPHOS 50  BILITOT 1.2  PROT 7.3  ALBUMIN 3.9   No results for input(s): "LIPASE", "AMYLASE" in the last 168 hours. No results for input(s): "AMMONIA" in the last 168 hours. Coagulation Profile: No results for input(s): "INR", "PROTIME" in the last 168 hours. Cardiac Enzymes: Recent Labs  Lab 03/09/24 1239  CKTOTAL 59   BNP (last 3 results) No results for input(s): "PROBNP" in the last 8760 hours. HbA1C: No results for input(s): "HGBA1C" in the last 72 hours. CBG: No results for input(s): "GLUCAP" in the last 168 hours. Lipid Profile: No results for input(s): "CHOL", "HDL", "LDLCALC", "TRIG", "CHOLHDL", "LDLDIRECT" in the last 72 hours. Thyroid Function Tests: No results for input(s): "TSH", "T4TOTAL", "FREET4", "T3FREE", "THYROIDAB" in the last 72 hours. Anemia Panel: No results for input(s): "VITAMINB12", "FOLATE", "FERRITIN", "TIBC", "IRON", "RETICCTPCT" in the last 72 hours. Urine analysis:    Component Value Date/Time   COLORURINE YELLOW (A) 03/09/2024 1534   APPEARANCEUR HAZY (A) 03/09/2024 1534   APPEARANCEUR Clear 05/03/2023 1437   LABSPEC 1.011 03/09/2024 1534   LABSPEC 1.015 06/01/2013 1452   PHURINE 5.0 03/09/2024 1534   GLUCOSEU NEGATIVE 03/09/2024 1534   GLUCOSEU 50 mg/dL 78/29/5621 3086   HGBUR NEGATIVE 03/09/2024 1534   BILIRUBINUR NEGATIVE 03/09/2024 1534   BILIRUBINUR Negative 05/03/2023 1437   BILIRUBINUR Negative 06/01/2013 1452   KETONESUR NEGATIVE 03/09/2024 1534   PROTEINUR NEGATIVE 03/09/2024 1534   UROBILINOGEN 0.2 09/30/2012 1930   NITRITE POSITIVE (A) 03/09/2024 1534   LEUKOCYTESUR SMALL (A) 03/09/2024 1534   LEUKOCYTESUR Negative 06/01/2013 1452   Sepsis  Labs: @LABRCNTIP (procalcitonin:4,lacticidven:4)  ) Recent Results (from the past 240 hours)  Urine Culture     Status: Abnormal   Collection Time: 03/09/24  3:34 PM   Specimen: Urine, Random  Result Value Ref Range Status   Specimen Description   Final    URINE, RANDOM Performed at [facility], [address] [place], [place] [address]    Special Requests   Final    NONE Reflexed from [id number] Performed at [facility], [address] [place], [place] [address]    Culture (A)  Final    >=100,000 COLONIES/mL ESCHERICHIA COLI Confirmed Extended Spectrum Beta-Lactamase Producer (ESBL).  In bloodstream infections from ESBL organisms, carbapenems are preferred over piperacillin/tazobactam. They are shown to have a lower risk of mortality.    Report Status 03/11/2024 FINAL  Final   Organism ID, Bacteria ESCHERICHIA COLI (A)  Final      Susceptibility   Escherichia coli - MIC*    AMPICILLIN >=32 RESISTANT Resistant  CEFAZOLIN >=64 RESISTANT Resistant     CEFEPIME 8 INTERMEDIATE Intermediate     CEFTRIAXONE >=64 RESISTANT Resistant     CIPROFLOXACIN <=0.25 SENSITIVE Sensitive     GENTAMICIN >=16 RESISTANT Resistant     IMIPENEM 0.5 SENSITIVE Sensitive     NITROFURANTOIN 32 SENSITIVE Sensitive     TRIMETH/SULFA >=320 RESISTANT Resistant     AMPICILLIN/SULBACTAM >=32 RESISTANT Resistant     PIP/TAZO <=4 SENSITIVE Sensitive ug/mL    * >=100,000 COLONIES/mL ESCHERICHIA COLI  Blood culture (routine x 2)     Status: Abnormal (Preliminary result)   Collection Time: 03/09/24  4:36 PM   Specimen: BLOOD RIGHT ARM  Result Value Ref Range Status   Specimen Description   Final    BLOOD RIGHT ARM Performed at [facility], [address] [place], [place] [address]    Special Requests   Final    BOTTLES DRAWN AEROBIC AND ANAEROBIC Blood Culture results may not be optimal due to an inadequate volume of blood received in culture bottles Performed at [facility], [address] [place], [place] [address]    Culture  Setup Time   Final    IN BOTH AEROBIC AND ANAEROBIC BOTTLES GRAM NEGATIVE RODS CRITICAL RESULT CALLED TO, READ BACK BY AND VERIFIED WITH: JASON ROBBINS @ 0531 03/10/24 [facility]    Culture (A)  Final    ESCHERICHIA COLI SUSCEPTIBILITIES TO FOLLOW Performed at [facility], [address] [place], [place] [address]    Report Status PENDING  Incomplete  Blood culture (routine x 2)     Status: None (Preliminary result)   Collection Time: 03/09/24  4:36 PM   Specimen: BLOOD LEFT ARM  Result Value Ref Range Status   Specimen Description   Final    BLOOD LEFT ARM Performed at [facility], [address], [place], [place] [address]    Special Requests   Final    BOTTLES DRAWN AEROBIC AND ANAEROBIC Blood Culture results may not be optimal due to an inadequate volume of blood received in culture bottles Performed at [facility], [address] [place], [place] [address]    Culture  Setup Time   Final    IN BOTH AEROBIC AND ANAEROBIC BOTTLES GRAM NEGATIVE RODS CRITICAL RESULT CALLED TO, READ BACK BY AND VERIFIED WITH: JASON ROBBINS @ 0531 03/10/24 [facility] CRITICAL VALUE NOTED.  VALUE IS CONSISTENT WITH PREVIOUSLY REPORTED AND CALLED VALUE.    Culture   Final    GRAM NEGATIVE RODS IDENTIFICATION TO FOLLOW Performed at [facility], [address] [place], [place] [address]    Report Status PENDING  Incomplete  Blood Culture ID Panel (Reflexed)     Status: Abnormal   Collection Time: 03/09/24  4:36 PM  Result Value Ref Range Status   Enterococcus faecalis NOT DETECTED NOT DETECTED Final   Enterococcus Faecium NOT DETECTED NOT DETECTED Final   Listeria monocytogenes NOT DETECTED NOT DETECTED Final   Staphylococcus species NOT DETECTED NOT DETECTED Final   Staphylococcus aureus (BCID) NOT DETECTED NOT DETECTED Final   Staphylococcus epidermidis NOT DETECTED NOT DETECTED Final    Staphylococcus lugdunensis NOT DETECTED NOT DETECTED Final   Streptococcus species NOT DETECTED NOT DETECTED Final   Streptococcus agalactiae NOT DETECTED NOT DETECTED Final   Streptococcus pneumoniae NOT DETECTED NOT DETECTED Final   Streptococcus pyogenes NOT DETECTED NOT DETECTED Final   A.calcoaceticus-baumannii NOT DETECTED NOT DETECTED Final   Bacteroides fragilis NOT DETECTED NOT  DETECTED Final   Enterobacterales DETECTED (A) NOT DETECTED Final    Comment: Enterobacterales represent a large order of gram negative bacteria, not a single organism. CRITICAL RESULT CALLED TO, READ BACK BY AND VERIFIED WITH: JASON ROBBINS @ 0531 03/10/24 [facility]    Enterobacter cloacae complex NOT DETECTED NOT DETECTED Final   Escherichia coli DETECTED (A) NOT DETECTED Final    Comment: CRITICAL RESULT CALLED TO, READ BACK BY AND VERIFIED WITH: JASON ROBBINS @ 0531 03/10/24 [facility]    Klebsiella aerogenes NOT DETECTED NOT DETECTED Final   Klebsiella oxytoca NOT DETECTED NOT DETECTED Final   Klebsiella pneumoniae NOT DETECTED NOT DETECTED Final   Proteus species NOT DETECTED NOT DETECTED Final   Salmonella species NOT DETECTED NOT DETECTED Final   Serratia marcescens NOT DETECTED NOT DETECTED Final   Haemophilus influenzae NOT DETECTED NOT DETECTED Final   Neisseria meningitidis NOT DETECTED NOT DETECTED Final   Pseudomonas aeruginosa NOT DETECTED NOT DETECTED Final   Stenotrophomonas maltophilia NOT DETECTED NOT DETECTED Final   Candida albicans NOT DETECTED NOT DETECTED Final   Candida auris NOT DETECTED NOT DETECTED Final   Candida glabrata NOT DETECTED NOT DETECTED Final   Candida krusei NOT DETECTED NOT DETECTED Final   Candida parapsilosis NOT DETECTED NOT DETECTED Final   Candida tropicalis NOT DETECTED NOT DETECTED Final   Cryptococcus neoformans/gattii NOT DETECTED NOT DETECTED Final   CTX-M ESBL DETECTED (A) NOT DETECTED Final    Comment: CRITICAL RESULT CALLED TO, READ BACK BY AND VERIFIED  WITH: JASON ROBBINS @ 0531 03/10/24 [facility] (NOTE) Extended spectrum beta-lactamase detected. Recommend a carbapenem as initial therapy.      Carbapenem resistance IMP NOT DETECTED NOT DETECTED Final   Carbapenem resistance KPC NOT DETECTED NOT DETECTED Final   Carbapenem resistance NDM NOT DETECTED NOT DETECTED Final   Carbapenem resist OXA 48 LIKE NOT DETECTED NOT DETECTED Final   Carbapenem resistance VIM NOT DETECTED NOT DETECTED Final    Comment: Performed at [facility], [address], [place], [place] [address]         Radiology Studies: CT ABDOMEN PELVIS WO CONTRAST Result Date: 03/10/2024 CLINICAL DATA:  bacteremia, presume urinary source EXAM: CT ABDOMEN AND PELVIS WITHOUT CONTRAST TECHNIQUE: Multidetector CT imaging of the abdomen and pelvis was performed following the standard protocol without IV contrast. RADIATION DOSE REDUCTION: This exam was performed according to the departmental dose-optimization program which includes automated exposure control, adjustment of the mA and/or kV according to patient size and/or use of iterative reconstruction technique. COMPARISON:  07/31/2019 FINDINGS: Lower chest: Trace pleural effusions. No pericardial effusion. Blood pool is hypodense compared to the interventricular septum suggesting anemia. Extensive coronary and aortic calcifications, post CABG. Linear scarring at the left lung base. Hepatobiliary: Gallbladder nondistended. A few scattered calcified granulomas in the right hepatic lobe. No new liver lesion or biliary ductal dilatation. Pancreas: Unremarkable. No pancreatic ductal dilatation or surrounding inflammatory changes. Spleen: Normal in size without focal abnormality. Adrenals/Urinary Tract: No adrenal mass. Symmetric renal contours without focal lesion, urolithiasis, or hydronephrosis. Urinary bladder is distended. Stomach/Bowel: Stomach partially distended by ingested material, without acute finding. Small bowel  decompressed. Post appendectomy. The colon is partially distended, without acute finding. Vascular/Lymphatic: Extensive aortoiliac calcified atheromatous plaque without aneurysm. No abdominal or pelvic adenopathy. Reproductive: Status post hysterectomy. No adnexal masses. Other: No ascites.  No free air. Musculoskeletal: Mild spondylitic changes in the lower thoracic and lumbar spine. Soft tissue thickening overlying the lower sacrum. IMPRESSION: 1. No acute findings.  2. Trace pleural effusions. 3. Coronary and Aortic Atherosclerosis (ICD10-170.0). Electronically Signed   By: Nicoletta Barrier M.D.   On: 03/10/2024 14:35        Scheduled Meds:  ascorbic acid  1,000 mg Oral Daily   aspirin EC  81 mg Oral Daily   atorvastatin  40 mg Oral q1800   carbidopa-levodopa  2 tablet Oral Q breakfast   And   carbidopa-levodopa  1 tablet Oral 2 times per day   cholecalciferol  1,000 Units Oral Daily   enoxaparin (LOVENOX) injection  30 mg Subcutaneous Q24H   feeding supplement  237 mL Oral TID BM   multivitamin with minerals  1 tablet Oral Daily   sodium chloride flush  3 mL Intravenous Q12H   Continuous Infusions:  meropenem (MERREM) IV 1 g (03/11/24 0547)     LOS: 1 day     Raymonde Calico, MD [facility]   If 7PM-7AM, please contact night-coverage [email or web address] Password [facility] 03/11/2024, 2:35 PM

## 2024-03-11 NOTE — Progress Notes (Signed)
 Initial Nutrition Assessment  DOCUMENTATION CODES:   Obesity unspecified  INTERVENTION:   -Continue regular diet -Encourage adequate oral intake; family can bring outside food if desired -Ensure Enlive po TID, each supplement provides 350 kcal and 20 grams of protein -Magic cup TID with meals, each supplement provides 290 kcal and 9 grams of protein  -MVI with minerals daily   NUTRITION DIAGNOSIS:   Inadequate oral intake related to poor appetite as evidenced by meal completion < 25%, per patient/family report.  GOAL:   Patient will meet greater than or equal to 90% of their needs  MONITOR:   PO intake, Supplement acceptance  REASON FOR ASSESSMENT:   Consult Assessment of nutrition requirement/status, Poor PO  ASSESSMENT:   Pt with medical history significant of Parkinson's disease, CAD s/p CABG (2013), TIA, mild Alzheimer's dementia, hypertension, hyperlipidemia, who presents due to ground-level fall.  Pt admitted with complicated UTI secondary to bacteremia.   Reviewed I/O's: +340 ml x 24 hours and +140 ml since admission   Spoke with pt and daughter at bedside. Pt daughter provided most of the history. Per daughter, pt with poor appetite over the past few months, but has been an even more constant struggle over the past few weeks. Daughter states intake has decreased further in the hospital and that she is concerned that pt has very little motivation to eat despite encouragement. Pt consumed a few bites of sausage and gravy biscuit from [employer] this morning. Meal completions 30%.   Per daughter, pt consumed about "half ov breakfast at home (toast and cornflakes) and about half of her lunch (daughter and husband take her out for socialization daily). Lunch meal consist of breakfast foods of chicken tacos, which pt consumes about half of with encouragement. Daughter reports pt seems to be overwhelmed with large portions of food. She also consumes 1-2 Ensure supplements  with encouragement.   Per daughter, UBW is around 150#. She estimates pt has lost 10# over the past 2 months. Wt has been stable over the past year.   RD provided emotional support, who reports increased stress in caring for mother as well as other family issues. Encouraged to bring in outside foods if possible. Discussed other options for supplements. Pt amenable to [employer].   Medications reviewed and include vitamin C, sinemet, and lovenox.   Lab Results  Component Value Date   HGBA1C 7.1 (H) 05/21/2018   PTA DM medications are none.   Labs reviewed: Na: 133, CBGS: 115 (inpatient orders for glycemic control are none).    NUTRITION - FOCUSED PHYSICAL EXAM:  Flowsheet Row Most Recent Value  Orbital Region No depletion  Upper Arm Region Mild depletion  Thoracic and Lumbar Region No depletion  Buccal Region No depletion  Temple Region No depletion  Clavicle Bone Region No depletion  Clavicle and Acromion Bone Region No depletion  Scapular Bone Region No depletion  Dorsal Hand Mild depletion  Patellar Region No depletion  Anterior Thigh Region No depletion  Posterior Calf Region No depletion  Edema (RD Assessment) Moderate  Hair Reviewed  Eyes Reviewed  Mouth Reviewed  Skin Reviewed  Nails Reviewed       Diet Order:   Diet Order             Diet regular Room service appropriate? Yes; Fluid consistency: Thin  Diet effective now                   EDUCATION NEEDS:   Education needs have  been addressed  Skin:  Skin Assessment: Reviewed RN Assessment  Last BM:  03/09/24  Height:   Ht Readings from Last 1 Encounters:  03/09/24 5' (1.524 m)    Weight:   Wt Readings from Last 1 Encounters:  03/10/24 70.4 kg    Ideal Body Weight:  45.5 kg  BMI:  Body mass index is 30.31 kg/m.  Estimated Nutritional Needs:   Kcal:  1600-1800  Protein:  80-95 grams  Fluid:  1.6-1.8 L    Herschel Lords, RD, LDN, CDCES Registered Dietitian III Certified Diabetes  Care and Education Specialist If unable to reach this RD, please use "RD Inpatient" group chat on secure chat between hours of 8am-4 pm daily

## 2024-03-11 NOTE — Progress Notes (Signed)
 Physical Therapy Treatment Patient Details Name: Kimberly Scott MRN: [id number] DOB: Nov 01, 1928 Today's Date: 03/11/2024   History of Present Illness Patient is a 89 year old female with ground level fall, UTI. History of parkinson's disease, CAD s/p CABG, TIA, mild Alzheimer's dementia, hypertension, hyperlipidemia    PT Comments  Patient is agreeable to PT. Increased independence with transfers and bed mobility. Increased ambulation distance with rolling walker for support. The patient does have generalized weakness and is not at her baseline level of independence. Anticipate the patient will require initial physical assistance with mobility after this hospital stay. Recommend rehabilitation < 3 hours/day.    If plan is discharge home, recommend the following: A lot of help with walking and/or transfers;A lot of help with bathing/dressing/bathroom;Assistance with cooking/housework;Help with stairs or ramp for entrance;Supervision due to cognitive status;Direct supervision/assist for financial management;Direct supervision/assist for medications management   Can travel by private vehicle     No  Equipment Recommendations  None recommended by PT    Recommendations for Other Services       Precautions / Restrictions Precautions Precautions: Fall Recall of Precautions/Restrictions: Impaired Restrictions Weight Bearing Restrictions Per Provider Order: No     Mobility  Bed Mobility Overal bed mobility: Needs Assistance Bed Mobility: Supine to Sit, Sit to Supine     Supine to sit: Mod assist     General bed mobility comments: assistance for trunk and BLE support. verbal cues for technique    Transfers Overall transfer level: Needs assistance Equipment used: Rolling walker (2 wheels) Transfers: Sit to/from Stand Sit to Stand: Min assist           General transfer comment: cues for hand placement. lifting assistance required    Ambulation/Gait Ambulation/Gait  assistance: Min assist, Contact guard assist Gait Distance (Feet): 35 Feet Assistive device: Rolling walker (2 wheels) Gait Pattern/deviations: Shuffle, Trunk flexed, Decreased stride length Gait velocity: decreased     General Gait Details: cues for upright standing posture with carry over demonstrated with increased trunk flexion with fatigue. patient limits further walking due to fatigue with mobility   Stairs             Wheelchair Mobility     Tilt Bed    Modified Rankin (Stroke Patients Only)       Balance Overall balance assessment: Needs assistance Sitting-balance support: Feet supported Sitting balance-Leahy Scale: Fair     Standing balance support: Bilateral upper extremity supported, No upper extremity supported Standing balance-Leahy Scale: Poor Standing balance comment: CGA provided with heavy reliance on rolling walker for support                            Communication Communication Communication: No apparent difficulties  Cognition Arousal: Alert Behavior During Therapy: WFL for tasks assessed/performed   PT - Cognitive impairments: History of cognitive impairments                         Following commands: Impaired Following commands impaired: Follows one step commands with increased time    Cueing Cueing Techniques: Verbal cues, Tactile cues  Exercises      General Comments        Pertinent Vitals/Pain Pain Assessment Pain Assessment: Faces Faces Pain Scale: Hurts a little bit Pain Location: left leg Pain Descriptors / Indicators: Sore Pain Intervention(s): Limited activity within patient's tolerance, Monitored during session, Repositioned    Home Living  Prior Function            PT Goals (current goals can now be found in the care plan section) Acute Rehab PT Goals Patient Stated Goal: to return home PT Goal Formulation: With patient Time For Goal Achievement:  03/24/24 Potential to Achieve Goals: Fair Progress towards PT goals: Progressing toward goals    Frequency    Min 2X/week      PT Plan      Co-evaluation              AM-PAC PT "6 Clicks" Mobility   Outcome Measure  Help needed turning from your back to your side while in a flat bed without using bedrails?: A Little Help needed moving from lying on your back to sitting on the side of a flat bed without using bedrails?: A Lot Help needed moving to and from a bed to a chair (including a wheelchair)?: A Lot Help needed standing up from a chair using your arms (e.g., wheelchair or bedside chair)?: A Little Help needed to walk in hospital room?: A Little Help needed climbing 3-5 steps with a railing? : A Lot 6 Click Score: 15    End of Session Equipment Utilized During Treatment: Gait belt Activity Tolerance: Patient tolerated treatment well Patient left: in chair;with call bell/phone within reach;with chair alarm set Nurse Communication: Mobility status PT Visit Diagnosis: Unsteadiness on feet (R26.81);Muscle weakness (generalized) (M62.81)     Time: 9629-5284 PT Time Calculation (min) (ACUTE ONLY): 29 min  Charges:    $Therapeutic Activity: 23-37 mins PT General Charges $$ ACUTE PT VISIT: 1 Visit                     Ozie Bo, PT, MPT    [name] 03/11/2024, 11:41 AM

## 2024-03-11 NOTE — Plan of Care (Signed)

## 2024-03-12 DIAGNOSIS — N390 Urinary tract infection, site not specified: Secondary | ICD-10-CM | POA: Diagnosis not present

## 2024-03-12 LAB — BASIC METABOLIC PANEL WITH GFR
Anion gap: 4 — ABNORMAL LOW (ref 5–15)
BUN: 21 mg/dL (ref 8–23)
CO2: 26 mmol/L (ref 22–32)
Calcium: 8.4 mg/dL — ABNORMAL LOW (ref 8.9–10.3)
Chloride: 107 mmol/L (ref 98–111)
Creatinine, Ser: 1.1 mg/dL — ABNORMAL HIGH (ref 0.44–1.00)
GFR, Estimated: 47 mL/min — ABNORMAL LOW (ref >60)
Glucose, Bld: 117 mg/dL — ABNORMAL HIGH (ref 70–99)
Potassium: 4.3 mmol/L (ref 3.5–5.1)
Sodium: 137 mmol/L (ref 135–145)

## 2024-03-12 LAB — CULTURE, BLOOD (ROUTINE X 2)

## 2024-03-12 MED ORDER — SODIUM CHLORIDE 0.9% FLUSH: 10.0000 mL | INTRAVENOUS | Status: DC | PRN

## 2024-03-12 MED ORDER — SODIUM CHLORIDE 0.9 % IV SOLN
1.0000 g | INTRAVENOUS | Status: DC
  Administered 2024-03-13: 1 g via INTRAVENOUS
  Filled 2024-03-12: qty 1000

## 2024-03-12 MED ORDER — SODIUM CHLORIDE 0.9% FLUSH
10.0000 mL | Freq: Two times a day (BID) | INTRAVENOUS | Status: DC
  Administered 2024-03-12 – 2024-03-14 (×4): 10 mL

## 2024-03-12 NOTE — Progress Notes (Signed)
 PROGRESS NOTE    Kimberly Scott  ZOX:[id number] DOB: 04-Nov-1928 DOA: 03/09/2024 PCP: Lynnea Ferrier, MD    Brief Narrative:   From admission h and p  Kimberly Scott is a 89 y.o. female with medical history significant of Parkinson's disease, CAD s/p CABG (2013), TIA, mild Alzheimer's dementia, hypertension, hyperlipidemia, who presents to the ED due to ground-level fall.   History provided by both daughter and Kimberly Scott at bedside.  Kimberly Scott states that over the past few days, she has been feeling increasingly weak with a poor appetite.  Her daughter states that a few days ago, she noticed low blood pressure and called her PCP, who recommended she cut the telmisartan dose in half.  The day after doing so, blood pressure returned back to normal.  Last night, patient states that she was trying to put her cane down next to her walker when she lost her footing and somehow slipped.  The cane ended up hitting her in the head but she denies any loss of consciousness.  At this time, she denies any nausea, vomiting, abdominal pain, dysuria, hematuria, urinary frequency or urgency.  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 No murmur. Hemodynamically stable. CT abdomen/pelvis nothing acute - continue penem - midline ordered - ID to place final treatment recs today  # Falls Likely weak from bacteremia. CT head/nec, hips, shoulder all neg, no new pains - PT consulted, advises snf, patient now assents to SNF, daughter wants this, TOC aware  # Parkinson's disease # Dementia Stable - home sinemet  # Hx  TIA - home asa, statin  # HTN Here bp wnl - holding home telmisartan   DVT prophylaxis: lovenox Code Status: full Family Communication: daughter updated at bedside 4/15  Level of care: Med-Surg Status is: Inpatient Remains inpatient appropriate because: severity of illness    Consultants:  ID  Procedures: none  Antimicrobials:  meropenem    Subjective: Feeling fine, worked with pt today  Objective: Vitals:   03/11/24 2140 03/12/24 0501 03/12/24 0740 03/12/24 0920  BP: (!) 129/105 (!) 150/59 (!) 135/39   Pulse: (!) 110 68 (!) 38 62  Resp: 16 16 18    Temp: 98.7 F (37.1 C) 97.8 F (36.6 C)    TempSrc: Oral     SpO2: 93% 95% 96%   Weight:      Height:        Intake/Output Summary (Last 24 hours) at 03/12/2024 1519 Last data filed at 03/12/2024 0900 Gross per 24 hour  Intake 537 ml  Output --  Net 537 ml   Filed Weights   03/09/24 1128 03/10/24 0637  Weight: 67.2 kg 70.4 kg    Examination:  General exam: Appears calm and comfortable  Respiratory system: Clear to auscultation. Respiratory effort normal. Cardiovascular system: S1 & S2 heard, RRR.   Gastrointestinal system: Abdomen is nondistended, soft and nontender.   Central nervous system: Alert and oriented. No focal neurological deficits. Extremities: Symmetric 5 x 5 power. Skin: No rashes, lesions or ulcers Psychiatry: Judgement and insight appear normal. Mood & affect appropriate.  Data Reviewed: I have personally reviewed following labs and imaging studies  CBC: Recent Labs  Lab 03/09/24 1239 03/10/24 0154 03/11/24 0532  WBC 9.5 9.3 6.8  HGB 11.6* 10.4* 10.9*  HCT 35.0* 31.9* 32.5*  MCV 96.2 98.5 95.0  PLT 228 197 200   Basic Metabolic Panel: Recent Labs  Lab 03/09/24 1239 03/10/24 0154 03/11/24 0532 03/12/24 0432  NA 136 137 133* 137  K 4.7 4.4 4.7 4.3  CL 104 108 105 107  CO2 23 23 23 26   GLUCOSE 104* 146* 114* 117*  BUN 35* 32* 24* 21  CREATININE 1.39* 1.25* 1.17*  1.10*  CALCIUM 9.4 8.4* 8.5* 8.4*  MG  --  2.0  --   --    GFR: Estimated Creatinine Clearance: 28 mL/min (A) (by C-G formula based on SCr of 1.1 mg/dL (H)). Liver Function Tests: Recent Labs  Lab 03/09/24 1239  AST 25  ALT 11  ALKPHOS 50  BILITOT 1.2  PROT 7.3  ALBUMIN 3.9   No results for input(s): "LIPASE", "AMYLASE" in the last 168 hours. No results for input(s): "AMMONIA" in the last 168 hours. Coagulation Profile: No results for input(s): "INR", "PROTIME" in the last 168 hours. Cardiac Enzymes: Recent Labs  Lab 03/09/24 1239  CKTOTAL 59   BNP (last 3 results) No results for input(s): "PROBNP" in the last 8760 hours. HbA1C: No results for input(s): "HGBA1C" in the last 72 hours. CBG: No results for input(s): "GLUCAP" in the last 168 hours. Lipid Profile: No results for input(s): "CHOL", "HDL", "LDLCALC", "TRIG", "CHOLHDL", "LDLDIRECT" in the last 72 hours. Thyroid Function Tests: No results for input(s): "TSH", "T4TOTAL", "FREET4", "T3FREE", "THYROIDAB" in the last 72 hours. Anemia Panel: No results for input(s): "VITAMINB12", "FOLATE", "FERRITIN", "TIBC", "IRON", "RETICCTPCT" in the last 72 hours. Urine analysis:    Component Value Date/Time   COLORURINE YELLOW (A) 03/09/2024 1534   APPEARANCEUR HAZY (A) 03/09/2024 1534   APPEARANCEUR Clear 05/03/2023 1437   LABSPEC 1.011 03/09/2024 1534   LABSPEC 1.015 06/01/2013 1452   PHURINE 5.0 03/09/2024 1534   GLUCOSEU NEGATIVE 03/09/2024 1534   GLUCOSEU 50 mg/dL 16/08/9603 5409   HGBUR NEGATIVE 03/09/2024 1534   BILIRUBINUR NEGATIVE 03/09/2024 1534   BILIRUBINUR Negative 05/03/2023 1437   BILIRUBINUR Negative 06/01/2013 1452   KETONESUR NEGATIVE 03/09/2024 1534   PROTEINUR NEGATIVE 03/09/2024 1534   UROBILINOGEN 0.2 09/30/2012 1930   NITRITE POSITIVE (A) 03/09/2024 1534   LEUKOCYTESUR SMALL (A) 03/09/2024 1534   LEUKOCYTESUR Negative 06/01/2013 1452   Sepsis  Labs: @LABRCNTIP (procalcitonin:4,lacticidven:4)  ) Recent Results (from the past 240 hours)  Urine Culture     Status: Abnormal   Collection Time: 03/09/24  3:34 PM   Specimen: Urine, Random  Result Value Ref Range Status   Specimen Description   Final    URINE, RANDOM Performed at [facility], [address], [place], [place] [address]    Special Requests   Final    NONE Reflexed from [phone] Performed at [facility], [address] [place], [place] [address]    Culture (A)  Final    >=100,000 COLONIES/mL ESCHERICHIA COLI Confirmed Extended Spectrum Beta-Lactamase Producer (ESBL).  In bloodstream infections from ESBL organisms, carbapenems are preferred over piperacillin/tazobactam. They are shown to have a lower risk of mortality.    Report Status 03/11/2024 FINAL  Final   Organism ID, Bacteria ESCHERICHIA COLI (A)  Final      Susceptibility   Escherichia coli - MIC*    AMPICILLIN >=  32 RESISTANT Resistant     CEFAZOLIN >=64 RESISTANT Resistant     CEFEPIME 8 INTERMEDIATE Intermediate     CEFTRIAXONE >=64 RESISTANT Resistant     CIPROFLOXACIN <=0.25 SENSITIVE Sensitive     GENTAMICIN >=16 RESISTANT Resistant     IMIPENEM 0.5 SENSITIVE Sensitive     NITROFURANTOIN 32 SENSITIVE Sensitive     TRIMETH/SULFA >=320 RESISTANT Resistant     AMPICILLIN/SULBACTAM >=32 RESISTANT Resistant     PIP/TAZO <=4 SENSITIVE Sensitive ug/mL    * >=100,000 COLONIES/mL ESCHERICHIA COLI  Blood culture (routine x 2)     Status: Abnormal   Collection Time: 03/09/24  4:36 PM   Specimen: BLOOD RIGHT ARM  Result Value Ref Range Status   Specimen Description   Final    BLOOD RIGHT ARM Performed at [facility], [address], [place], [place] [address]    Special Requests   Final    BOTTLES DRAWN AEROBIC AND ANAEROBIC Blood Culture results may not be optimal due to an inadequate volume of blood received in culture bottles Performed at [facility], [address] [place], [place] [address]    Culture  Setup Time   Final    IN BOTH AEROBIC AND ANAEROBIC BOTTLES GRAM NEGATIVE RODS CRITICAL RESULT CALLED TO, READ BACK BY AND VERIFIED WITHPrincella Ion @ 0531 03/10/24 [facility] Performed at [facility], [address] [place], [place] [address]    Culture (A)  Final    ESCHERICHIA COLI Confirmed Extended Spectrum Beta-Lactamase Producer (ESBL).  In bloodstream infections from ESBL organisms, carbapenems are preferred over piperacillin/tazobactam. They are shown to have a lower risk of mortality.    Report Status 03/12/2024 FINAL  Final   Organism ID, Bacteria ESCHERICHIA COLI  Final      Susceptibility   Escherichia coli - MIC*    AMPICILLIN >=32 RESISTANT Resistant     CEFEPIME 16 RESISTANT Resistant     CEFTAZIDIME RESISTANT Resistant     CEFTRIAXONE >=64 RESISTANT Resistant     CIPROFLOXACIN 0.5 INTERMEDIATE Intermediate     GENTAMICIN >=16 RESISTANT Resistant     IMIPENEM <=0.25 SENSITIVE Sensitive     TRIMETH/SULFA >=320 RESISTANT Resistant     AMPICILLIN/SULBACTAM >=32 RESISTANT Resistant     PIP/TAZO <=4 SENSITIVE Sensitive ug/mL    * ESCHERICHIA COLI  Blood culture (routine x 2)     Status: Abnormal   Collection Time: 03/09/24  4:36 PM   Specimen: BLOOD LEFT ARM  Result Value Ref Range Status   Specimen Description   Final    BLOOD LEFT ARM Performed at [facility], [address], [place], [place] [address]    Special Requests   Final    BOTTLES DRAWN AEROBIC AND ANAEROBIC Blood Culture results may not be optimal due to an inadequate volume of blood received in culture bottles Performed at [facility], [address] [place], [place] [address]    Culture  Setup Time   Final    IN BOTH AEROBIC AND ANAEROBIC BOTTLES GRAM NEGATIVE RODS CRITICAL RESULT CALLED TO, READ BACK BY AND VERIFIED WITH: JASON ROBBINS @ 0531 03/10/24 [facility] CRITICAL VALUE NOTED.  VALUE IS CONSISTENT WITH PREVIOUSLY REPORTED  AND CALLED VALUE.    Culture (A)  Final    ESCHERICHIA COLI SUSCEPTIBILITIES PERFORMED ON PREVIOUS CULTURE WITHIN THE LAST 5 DAYS. Performed at [facility], [address], [place], [place] [address]    Report Status 03/12/2024 FINAL  Final  Blood Culture ID Panel (Reflexed)     Status: Abnormal   Collection Time: 03/09/24  4:36 PM  Result Value Ref Range Status   Enterococcus faecalis NOT DETECTED NOT DETECTED Final   Enterococcus Faecium NOT DETECTED NOT DETECTED Final   Listeria monocytogenes NOT DETECTED NOT DETECTED Final   Staphylococcus species NOT DETECTED NOT DETECTED Final   Staphylococcus aureus (BCID) NOT DETECTED NOT DETECTED Final   Staphylococcus epidermidis NOT DETECTED NOT DETECTED Final   Staphylococcus lugdunensis NOT DETECTED NOT DETECTED Final   Streptococcus species NOT DETECTED NOT DETECTED Final   Streptococcus agalactiae NOT DETECTED NOT DETECTED Final   Streptococcus pneumoniae NOT DETECTED NOT DETECTED Final   Streptococcus pyogenes NOT DETECTED NOT DETECTED Final   A.calcoaceticus-baumannii NOT DETECTED NOT DETECTED Final   Bacteroides fragilis NOT DETECTED NOT DETECTED Final   Enterobacterales DETECTED (A) NOT DETECTED Final    Comment: Enterobacterales represent a large order of gram negative bacteria, not a single organism. CRITICAL RESULT CALLED TO, READ BACK BY AND VERIFIED WITH: JASON ROBBINS @ 0531 03/10/24 [facility]    Enterobacter cloacae complex NOT DETECTED NOT DETECTED Final   Escherichia coli DETECTED (A) NOT DETECTED Final    Comment: CRITICAL RESULT CALLED TO, READ BACK BY AND VERIFIED WITH: JASON ROBBINS @ 0531 03/10/24 [facility]    Klebsiella aerogenes NOT DETECTED NOT DETECTED Final   Klebsiella oxytoca NOT DETECTED NOT DETECTED Final   Klebsiella pneumoniae NOT DETECTED NOT DETECTED Final   Proteus species NOT DETECTED NOT DETECTED Final   Salmonella species NOT DETECTED NOT DETECTED Final   Serratia marcescens NOT DETECTED NOT DETECTED  Final   Haemophilus influenzae NOT DETECTED NOT DETECTED Final   Neisseria meningitidis NOT DETECTED NOT DETECTED Final   Pseudomonas aeruginosa NOT DETECTED NOT DETECTED Final   Stenotrophomonas maltophilia NOT DETECTED NOT DETECTED Final   Candida albicans NOT DETECTED NOT DETECTED Final   Candida auris NOT DETECTED NOT DETECTED Final   Candida glabrata NOT DETECTED NOT DETECTED Final   Candida krusei NOT DETECTED NOT DETECTED Final   Candida parapsilosis NOT DETECTED NOT DETECTED Final   Candida tropicalis NOT DETECTED NOT DETECTED Final   Cryptococcus neoformans/gattii NOT DETECTED NOT DETECTED Final   CTX-M ESBL DETECTED (A) NOT DETECTED Final    Comment: CRITICAL RESULT CALLED TO, READ BACK BY AND VERIFIED WITH: JASON ROBBINS @ 0531 03/10/24 [facility] (NOTE) Extended spectrum beta-lactamase detected. Recommend a carbapenem as initial therapy.      Carbapenem resistance IMP NOT DETECTED NOT DETECTED Final   Carbapenem resistance KPC NOT DETECTED NOT DETECTED Final   Carbapenem resistance NDM NOT DETECTED NOT DETECTED Final   Carbapenem resist OXA 48 LIKE NOT DETECTED NOT DETECTED Final   Carbapenem resistance VIM NOT DETECTED NOT DETECTED Final    Comment: Performed at [facility], [address] [place], [place] [address]  Culture, blood (Routine X 2) w Reflex to ID Panel     Status: None (Preliminary result)   Collection Time: 03/11/24  3:30 PM   Specimen: BLOOD  Result Value Ref Range Status   Specimen Description BLOOD BLOOD RIGHT FOREARM  Final   Special Requests   Final    BOTTLES DRAWN AEROBIC ONLY Blood Culture results may not be optimal due to an inadequate volume of blood received in culture bottles   Culture   Final    NO GROWTH < 24 HOURS Performed at [facility], [address] [place], [place] [address]    Report  Status PENDING  Incomplete  Culture, blood (Routine X 2) w Reflex to ID Panel     Status: None (Preliminary result)   Collection  Time: 03/11/24  3:43 PM   Specimen: BLOOD  Result Value Ref Range Status   Specimen Description BLOOD BLOOD LEFT FOREARM  Final   Special Requests   Final    BOTTLES DRAWN AEROBIC AND ANAEROBIC Blood Culture adequate volume   Culture   Final    NO GROWTH < 24 HOURS Performed at [facility], [address] [place], [place] [address]    Report Status PENDING  Incomplete         Radiology Studies: No results found.       Scheduled Meds:  ascorbic acid  1,000 mg Oral Daily   aspirin EC  81 mg Oral Daily   atorvastatin  40 mg Oral q1800   carbidopa-levodopa  2 tablet Oral Q breakfast   And   carbidopa-levodopa  1 tablet Oral 2 times per day   cholecalciferol  1,000 Units Oral Daily   enoxaparin (LOVENOX) injection  30 mg Subcutaneous Q24H   feeding supplement  237 mL Oral TID BM   multivitamin with minerals  1 tablet Oral Daily   sodium chloride flush  3 mL Intravenous Q12H   Continuous Infusions:  meropenem (MERREM) IV Stopped (03/12/24 6045)     LOS: 2 days     Raymonde Calico, MD [facility]   If 7PM-7AM, please contact night-coverage [email or web address] Password [facility] 03/12/2024, 3:19 PM

## 2024-03-12 NOTE — Treatment Plan (Signed)
 Diagnosis: ESBL ecoli bacteremia and UTI Baseline Creatinine  1.10    Allergies  Allergen Reactions   Bacitracin-Neomycin-Polymyxin Swelling and Rash    Blisters Blisters    Ace Inhibitors     Other reaction(s): Cough   Omega 3  [Fish Oil] Other (See Comments)   Neosporin [Neomycin-Bacitracin Zn-Polymyx] Rash   Tape Rash    Blisters    OPAT Orders Discharge antibiotics: Ertapenem 1 gram IV every 24 hrs until 03/19/24 Adjust dose depending on cr and crcl Duration: 10 days   [facility] Per Protocol:  Labs Daw on 03/16/24  while on IV antibiotics: X__ CBC with differential  _X_ CMP   _X_ Please pull PIC at completion of IV antibiotics  Fax weekly lab results  promptly to [phone]  Clinic Follow Up Appt:not needed with Dr.Henley Boettner   Call [phone] with any questions

## 2024-03-12 NOTE — Plan of Care (Signed)

## 2024-03-12 NOTE — Progress Notes (Signed)
 Physical Therapy Treatment Patient Details Name: Kimberly Scott MRN: [id number] DOB: 1927-12-17 Today's Date: 03/12/2024   History of Present Illness Patient is a 89 year old female with ground level fall, UTI. History of parkinson's disease, CAD s/p CABG, TIA, mild Alzheimer's dementia, hypertension, hyperlipidemia    PT Comments  Patient is agreeable to PT session. She continues to require intermittent physical assistance with mobility and could benefit from continued PT to maximize independence. Rehabilitation < 3 hours/day recommended after this hospital stay.    If plan is discharge home, recommend the following: A lot of help with walking and/or transfers;A lot of help with bathing/dressing/bathroom;Assistance with cooking/housework;Help with stairs or ramp for entrance;Supervision due to cognitive status;Direct supervision/assist for financial management;Direct supervision/assist for medications management   Can travel by private vehicle     No  Equipment Recommendations  None recommended by PT    Recommendations for Other Services       Precautions / Restrictions Precautions Precautions: Fall Restrictions Weight Bearing Restrictions Per Provider Order: No     Mobility  Bed Mobility Overal bed mobility: Needs Assistance Bed Mobility: Supine to Sit     Supine to sit: Min assist     General bed mobility comments: assistance for BLE and trunk support. cues for technique    Transfers Overall transfer level: Needs assistance Equipment used: Rolling walker (2 wheels) Transfers: Sit to/from Stand Sit to Stand: Min assist           General transfer comment: lifting assistance required for standing. positioning instruction with sitting    Ambulation/Gait Ambulation/Gait assistance: Contact guard assist, Min assist Gait Distance (Feet): 12 Feet Assistive device: Rolling walker (2 wheels) Gait Pattern/deviations: Shuffle, Trunk flexed, Decreased stride  length Gait velocity: decreased     General Gait Details: cues for technique and for safety.   Stairs             Wheelchair Mobility     Tilt Bed    Modified Rankin (Stroke Patients Only)       Balance Overall balance assessment: Needs assistance Sitting-balance support: Feet supported Sitting balance-Leahy Scale: Fair     Standing balance support: Bilateral upper extremity supported, No upper extremity supported Standing balance-Leahy Scale: Poor Standing balance comment: CGA provided with heavy reliance on rolling walker for support                            Communication Communication Communication: No apparent difficulties  Cognition Arousal: Alert Behavior During Therapy: WFL for tasks assessed/performed   PT - Cognitive impairments: History of cognitive impairments                       PT - Cognition Comments: patient is able to follow single step commands with increased time Following commands: Impaired Following commands impaired: Follows one step commands with increased time    Cueing Cueing Techniques: Verbal cues, Tactile cues  Exercises      General Comments        Pertinent Vitals/Pain Pain Assessment Pain Assessment: Faces Faces Pain Scale: Hurts a little bit Pain Location: left leg Pain Descriptors / Indicators: Sore Pain Intervention(s): Limited activity within patient's tolerance, Monitored during session, Repositioned    Home Living                          Prior Function  PT Goals (current goals can now be found in the care plan section) Acute Rehab PT Goals Patient Stated Goal: to return home PT Goal Formulation: With patient Time For Goal Achievement: 03/24/24 Potential to Achieve Goals: Fair Progress towards PT goals: Progressing toward goals    Frequency    Min 2X/week      PT Plan      Co-evaluation              AM-PAC PT "6 Clicks" Mobility   Outcome  Measure  Help needed turning from your back to your side while in a flat bed without using bedrails?: A Little Help needed moving from lying on your back to sitting on the side of a flat bed without using bedrails?: A Lot Help needed moving to and from a bed to a chair (including a wheelchair)?: A Lot Help needed standing up from a chair using your arms (e.g., wheelchair or bedside chair)?: A Little Help needed to walk in hospital room?: A Little Help needed climbing 3-5 steps with a railing? : A Lot 6 Click Score: 15    End of Session   Activity Tolerance: Patient tolerated treatment well Patient left: in chair;with call bell/phone within reach;with chair alarm set Nurse Communication: Mobility status PT Visit Diagnosis: Unsteadiness on feet (R26.81);Muscle weakness (generalized) (M62.81)     Time: 1610-9604 PT Time Calculation (min) (ACUTE ONLY): 19 min  Charges:    $Therapeutic Activity: 8-22 mins PT General Charges $$ ACUTE PT VISIT: 1 Visit                     Ozie Bo, PT, MPT    [name] 03/12/2024, 12:44 PM

## 2024-03-12 NOTE — Progress Notes (Signed)
 Date of Admission:  03/09/2024      ID: Kimberly Scott is a 89 y.o. 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   Stage 3b chronic kidney disease (HCC)   AKI (acute kidney injury) (HCC)   Alzheimer dementia (HCC)   Bacteremia    Pt is doing okay Daughter at bed side  Medications:   ascorbic acid  1,000 mg Oral Daily   aspirin EC  81 mg Oral Daily   atorvastatin  40 mg Oral q1800   carbidopa-levodopa  2 tablet Oral Q breakfast   And   carbidopa-levodopa  1 tablet Oral 2 times per day   cholecalciferol  1,000 Units Oral Daily   enoxaparin (LOVENOX) injection  30 mg Subcutaneous Q24H   feeding supplement  237 mL Oral TID BM   multivitamin with minerals  1 tablet Oral Daily   sodium chloride flush  3 mL Intravenous Q12H    Objective: Vital signs in last 24 hours: Patient Vitals for the past 24 hrs:  BP Temp Temp src Pulse Resp SpO2  03/12/24 0920 -- -- -- 62 -- --  03/12/24 0740 (!) 135/39 -- -- (!) 38 18 96 %  03/12/24 0501 (!) 150/59 97.8 F (36.6 C) -- 68 16 95 %  03/11/24 2140 (!) 129/105 98.7 F (37.1 C) Oral (!) 110 16 93 %  03/11/24 1622 (!) 127/50 98.1 F (36.7 C) Oral 79 20 96 %      PHYSICAL EXAM:  General: Alert, cooperative, no distress, .  Lungs: Clear to auscultation bilaterally. No Wheezing or Rhonchi. No rales. Heart: Regular rate and rhythm, no murmur, rub or gallop. Abdomen: Soft, non-tender,not distended. Bowel sounds normal. No masses Extremities: venous edema legs Rt knee surgical scar Skin: No rashes or lesions. Or bruising Lymph: Cervical, supraclavicular normal. Neurologic: Grossly non-focal  Lab Results    Latest Ref Rng & Units 03/11/2024    5:32 AM 03/10/2024    1:54 AM 03/09/2024   12:39 PM  CBC  WBC 4.0 - 10.5 K/uL 6.8  9.3  9.5    Hemoglobin 12.0 - 15.0 g/dL 16.1  09.6  04.5   Hematocrit 36.0 - 46.0 % 32.5  31.9  35.0   Platelets 150 - 400 K/uL 200  197  228        Latest Ref Rng & Units 03/12/2024    4:32 AM 03/11/2024    5:32 AM 03/10/2024    1:54 AM  CMP  Glucose 70 - 99 mg/dL 409  811  914   BUN 8 - 23 mg/dL 21  24  32   Creatinine 0.44 - 1.00 mg/dL 7.82  9.56  2.13   Sodium 135 - 145 mmol/L 137  133  137   Potassium 3.5 - 5.1 mmol/L 4.3  4.7  4.4   Chloride 98 - 111 mmol/L 107  105  108   CO2 22 - 32 mmol/L 26  23  23    Calcium 8.9 - 10.3 mg/dL 8.4  8.5  8.4       Microbiology: 03/09/24 ESBl ecoli in both sets 4/14 [facility]- neg so far Studies/Results: No results found.   Assessment/Plan: ESBL ecoli bacteremia and esbl ecoli in urine. She has no urinary symptoms She has no stones, no hydronephrosis, no stents She has  been colonized with this bacteria for more than a year- Post void bladder scan only 18 cc ESBL ecoli  intermediate resistant to cipro On meropenem Will do total of 10 days of Iv antibiotic    Fall   Anemia   AKI improved     H/o CABG H/o TKA   Discussed with patient and daughter some preventive measure for UTI OPAT note done?

## 2024-03-13 DIAGNOSIS — R7881 Bacteremia: Secondary | ICD-10-CM

## 2024-03-13 DIAGNOSIS — N1832 Chronic kidney disease, stage 3b: Secondary | ICD-10-CM

## 2024-03-13 DIAGNOSIS — N390 Urinary tract infection, site not specified: Secondary | ICD-10-CM | POA: Diagnosis not present

## 2024-03-13 LAB — BASIC METABOLIC PANEL WITH GFR
Anion gap: 4 — ABNORMAL LOW (ref 5–15)
BUN: 27 mg/dL — ABNORMAL HIGH (ref 8–23)
CO2: 25 mmol/L (ref 22–32)
Calcium: 8.2 mg/dL — ABNORMAL LOW (ref 8.9–10.3)
Chloride: 106 mmol/L (ref 98–111)
Creatinine, Ser: 1.09 mg/dL — ABNORMAL HIGH (ref 0.44–1.00)
GFR, Estimated: 47 mL/min — ABNORMAL LOW (ref >60)
Glucose, Bld: 122 mg/dL — ABNORMAL HIGH (ref 70–99)
Potassium: 4.6 mmol/L (ref 3.5–5.1)
Sodium: 135 mmol/L (ref 135–145)

## 2024-03-13 MED ORDER — SODIUM CHLORIDE 0.9 % IV SOLN
500.0000 mg | INTRAVENOUS | Status: DC
  Administered 2024-03-14: 500 mg via INTRAVENOUS
  Filled 2024-03-13: qty 500

## 2024-03-13 NOTE — Care Management Important Message (Signed)
 Important Message  Patient Details  Name: Kimberly Scott MRN: [id number] Date of Birth: 1928/08/01   Important Message Given:  Yes - Medicare IM     Baird Polinski W, CMA 03/13/2024, 12:27 PM

## 2024-03-13 NOTE — Plan of Care (Signed)

## 2024-03-13 NOTE — Progress Notes (Signed)
 PHARMACY CONSULT NOTE FOR:  OUTPATIENT  PARENTERAL ANTIBIOTIC THERAPY (OPAT)  Indication: ESBL E coli bacteremia and UTI Regimen: Ertapenem 500mg  IV q24h End date: 03/19/2024  - Labs - Once weekly:  CBC/D and CMP - Fax weekly lab results promptly to [phone]  - Please pull PICC/midline at completion of IV antibiotics - Call [phone] with any questions   IV antibiotic discharge orders are pended. To discharging provider:  please sign these orders via discharge navigator,  Select New Orders & click on the button choice - Manage This Unsigned Work.     Thank you for allowing pharmacy to be a part of this patient's care.  Colston Pyle, PharmD, BCPS, BCIDP Work Cell: [phone] 03/13/2024 12:46 PM

## 2024-03-13 NOTE — Progress Notes (Signed)
 Nutrition Follow-up  DOCUMENTATION CODES:   Obesity unspecified  INTERVENTION:   -Continue regular diet -Encourage adequate oral intake; family can bring outside food if desired -Continue Ensure Enlive po TID, each supplement provides 350 kcal and 20 grams of protein -Continue Magic cup TID with meals, each supplement provides 290 kcal and 9 grams of protein  -Continue MVI with minerals daily   NUTRITION DIAGNOSIS:   Inadequate oral intake related to poor appetite as evidenced by meal completion < 25%, per patient/family report.  Ongoing  GOAL:   Patient will meet greater than or equal to 90% of their needs  Progressing   MONITOR:   PO intake, Supplement acceptance  REASON FOR ASSESSMENT:   Consult Assessment of nutrition requirement/status, Poor PO  ASSESSMENT:   Pt with medical history significant of Parkinson's disease, CAD s/p CABG (2013), TIA, mild Alzheimer's dementia, hypertension, hyperlipidemia, who presents due to ground-level fall.  Reviewed I/O's: +200 ml x 24 hours and +877 ml since admission  Pt sleeping soundly at time of visit. No family at bedside.   Pt remains with variable intake. Noted meal completions 0-85%. Pt has been consuming Ensure supplements.   Per TOC notes, pt awaiting SNF placement for discharge.   Medications reviewed and include vitamin C, sinemet, vitamin D3, and lovenox.   Labs reviewed: CBGS: 115 (inpatient orders for glycemic control are none).    Diet Order:   Diet Order             Diet regular Room service appropriate? Yes; Fluid consistency: Thin  Diet effective now                   EDUCATION NEEDS:   Education needs have been addressed  Skin:  Skin Assessment: Reviewed RN Assessment  Last BM:  03/13/24 (type 4)  Height:   Ht Readings from Last 1 Encounters:  03/09/24 5' (1.524 m)    Weight:   Wt Readings from Last 1 Encounters:  03/10/24 70.4 kg    Ideal Body Weight:  45.5 kg  BMI:  Body  mass index is 30.31 kg/m.  Estimated Nutritional Needs:   Kcal:  1600-1800  Protein:  80-95 grams  Fluid:  1.6-1.8 L    Herschel Lords, RD, LDN, CDCES Registered Dietitian III Certified Diabetes Care and Education Specialist If unable to reach this RD, please use "RD Inpatient" group chat on secure chat between hours of 8am-4 pm daily

## 2024-03-13 NOTE — Progress Notes (Signed)
  Progress Note   Patient: Kimberly Scott:[id number] DOB: 1928-02-16 DOA: 03/09/2024     3 DOS: the patient was seen and examined on 03/13/2024   Brief hospital course: KADRA KOHAN is a 89 y.o. female with medical history significant of Parkinson's disease, CAD s/p CABG (2013), TIA, mild Alzheimer's dementia, hypertension, hyperlipidemia, who presents to the ED due to ground-level fall.  Had increased weakness and poor appetite for the last few days.  Upon arrival in the hospital, he was found to have abnormal urine, urine and blood cultures was positive for E. coli ESBL.  She was treated with meropenem and switched to ertapenem.        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 coli ESBL bacteremia,  E. coli ESBL urinary tract infection. Presumed urinary source. No murmur. Hemodynamically stable. CT abdomen/pelvis nothing acute Antibiotic switched to oral depending on 2/22.  Patient has improved.   Falls with generalized weakness. Currently pending nursing home placement.   # Parkinson's disease # Dementia Continue home medicines.   # Hx TIA - home asa, statin   # HTN Blood pressure medicine on hold due to  Chronic kidney disease stage IIIb. AKI rule out Renal function still stable.  Patient does not have AKI.  Class I obesity with BMI 30.31. Diet and excise.    Subjective:  Patient doing well today, no complaint.  Physical Exam: Vitals:   03/12/24 1702 03/12/24 2110 03/13/24 0541 03/13/24 0805  BP: (!) 137/111 (!) 118/39 (!) 113/40 (!) 131/45  Pulse: (!) 58 60 (!) 51 (!) 52  Resp: 19 18  18   Temp: 98.3 F (36.8 C)  97.7 F (36.5 C) 97.9 F (36.6 C) 98.1 F (36.7 C)  TempSrc: Oral Oral Oral   SpO2: 94% 95% 94% 96%  Weight:      Height:       General exam: Appears calm and comfortable  Respiratory system: Clear to auscultation. Respiratory effort normal. Cardiovascular system: S1 & S2 heard, RRR. No JVD, murmurs, rubs, gallops or clicks. No pedal edema. Gastrointestinal system: Abdomen is nondistended, soft and nontender. No organomegaly or masses felt. Normal bowel sounds heard. Central nervous system: Alert and oriented x2. No focal neurological deficits. Extremities: Symmetric 5 x 5 power. Skin: No rashes, lesions or ulcers Psychiatry: . Mood & affect appropriate.    Data Reviewed:  CT scan, lab results reviewed.  Family Communication: Called, could not reach daughter  Disposition: Status is: Inpatient Remains inpatient appropriate because: Unsafe discharge plan.     Time spent: 35 minutes  Author: Donaciano Frizzle, MD 03/13/2024 1:45 PM  For on call review [email or web address].

## 2024-03-13 NOTE — Progress Notes (Signed)
 Occupational Therapy Treatment Patient Details Name: Kimberly Scott MRN: [id number] DOB: Nov 27, 1928 Today's Date: 03/13/2024   History of present illness Patient is a 89 year old female with ground level fall, UTI. History of parkinson's disease, CAD s/p CABG, TIA, mild Alzheimer's dementia, hypertension, hyperlipidemia   OT comments  Pt is seated in recliner on arrival. Pleasant and agreeable to OT session. She denies pain. Pt performed STS from recliner with Min/CGA, cueing for safety. Pt required CGA for in room mobility to the bathroom and back. Min A needed to maintain balance during peri-care due to LOB. Min/CGA for toilet transfer and clothing management. Able to perform standing grooming tasks at sink as well as hand hygiene with Min/CGA and constant BUE support on sink to maintain balance. MOD A for BLE management to return to supine. Pt fatigued at end of session and remains a fall risk. All needs in place and family member present. She will cont to require skilled acute OT services to maximize her safety and IND to return to PLOF.       If plan is discharge home, recommend the following:  Assistance with cooking/housework;Assist for transportation;Direct supervision/assist for financial management;Direct supervision/assist for medications management;Supervision due to cognitive status;Help with stairs or ramp for entrance;A little help with walking and/or transfers;A lot of help with bathing/dressing/bathroom   Equipment Recommendations  Other (comment) (defer)    Recommendations for Other Services      Precautions / Restrictions Precautions Precautions: Fall Restrictions Weight Bearing Restrictions Per Provider Order: No       Mobility Bed Mobility   Bed Mobility: Sit to Supine       Sit to supine: Mod assist   General bed mobility comments: BLE management to return to supine    Transfers Overall transfer level: Needs assistance Equipment used: Rolling  walker (2 wheels) Transfers: Sit to/from Stand Sit to Stand: Min assist, Contact guard assist           General transfer comment: cueing for safety/technique     Balance Overall balance assessment: Needs assistance Sitting-balance support: Feet supported Sitting balance-Leahy Scale: Good     Standing balance support: Bilateral upper extremity supported, During functional activity Standing balance-Leahy Scale: Poor Standing balance comment: external support, RW use and BUE support on sink during functional tasks; x1 LOB posterior with correction from therapist during peri-care                           ADL either performed or assessed with clinical judgement   ADL Overall ADL's : Needs assistance/impaired     Grooming: Contact guard assist;Standing Grooming Details (indicate cue type and reason): able to don lipstick, eyebrow liner, wash hands all standing at sink counter with RW and CGA; BUE support on sink to maintain balance             Lower Body Dressing: Sitting/lateral leans;Contact guard assist Lower Body Dressing Details (indicate cue type and reason): to doff slip on/off shoes Toilet Transfer: Contact guard assist;Rolling walker (2 wheels);Minimal assistance;BSC/3in1;Regular Teacher, adult education Details (indicate cue type and reason): Min/CGA for toilet transfer with BSC over toilet Toileting- Clothing Manipulation and Hygiene: Contact guard assist;Sit to/from stand Toileting - Clothing Manipulation Details (indicate cue type and reason): to remove soiled maxi pad and replace with new one            Extremity/Trunk Assessment  Vision       Perception     Praxis     Communication Communication Communication: No apparent difficulties   Cognition Arousal: Alert Behavior During Therapy: WFL for tasks assessed/performed                                 Following commands: Impaired Following commands  impaired: Follows one step commands with increased time      Cueing   Cueing Techniques: Verbal cues, Tactile cues  Exercises      Shoulder Instructions       General Comments      Pertinent Vitals/ Pain       Pain Assessment Pain Assessment: No/denies pain Pain Intervention(s): Monitored during session  Home Living                                          Prior Functioning/Environment              Frequency  Min 2X/week        Progress Toward Goals  OT Goals(current goals can now be found in the care plan section)  Progress towards OT goals: Progressing toward goals  Acute Rehab OT Goals Patient Stated Goal: get better to go home OT Goal Formulation: With patient/family Time For Goal Achievement: 03/24/24 Potential to Achieve Goals: Fair  Plan      Co-evaluation                 AM-PAC OT "6 Clicks" Daily Activity     Outcome Measure   Help from another person eating meals?: None Help from another person taking care of personal grooming?: A Little Help from another person toileting, which includes using toliet, bedpan, or urinal?: A Lot Help from another person bathing (including washing, rinsing, drying)?: A Lot Help from another person to put on and taking off regular upper body clothing?: A Little Help from another person to put on and taking off regular lower body clothing?: A Lot 6 Click Score: 16    End of Session Equipment Utilized During Treatment: Gait belt;Rolling walker (2 wheels)  OT Visit Diagnosis: Other abnormalities of gait and mobility (R26.89);History of falling (Z91.81);Muscle weakness (generalized) (M62.81)   Activity Tolerance Patient tolerated treatment well   Patient Left in bed;with call bell/phone within reach;with bed alarm set;with family/visitor present   Nurse Communication Mobility status        Time: 1341-1409 OT Time Calculation (min): 28 min  Charges: OT General Charges $OT Visit:  1 Visit OT Treatments $Self Care/Home Management : 23-37 mins  Barnes Florek, OTR/L  03/13/24, 3:11 PM   Brodi Nery E Wynelle Dreier 03/13/2024, 3:07 PM

## 2024-03-13 NOTE — TOC Progression Note (Addendum)
 Transition of Care [facility]) - Progression Note    Patient Details  Name: Kimberly Scott MRN: [id number] Date of Birth: March 08, 1928  [facility] CM/SW Contact  Elsie Halo, RN Phone Number: 03/13/2024, 12:56 PM  Clinical Narrative:     Patient with multiple bed offers in the hub including the the Family's first choice, [facility].   TOC called Antony Baumgartner at [facility] to let the facility know the patient accepted.  Insurance Auth started by [place] with TOC.   3:20: Firefighter [phone] received for [facility]    Expected Discharge Plan and Services                                               Social Determinants of Health (SDOH) Interventions SDOH Screenings   Food Insecurity: No Food Insecurity (03/10/2024)  Housing: Low Risk  (03/10/2024)  Transportation Needs: No Transportation Needs (03/10/2024)  Utilities: Not At Risk (03/10/2024)  Financial Resource Strain: Low Risk  (02/26/2024)   Received from [facility]  Physical Activity: Unknown (07/31/2019)  Social Connections: Patient Unable To Answer (03/10/2024)  Tobacco Use: Low Risk  (03/09/2024)    Readmission Risk Interventions     No data to display

## 2024-03-13 NOTE — Care Management Important Message (Signed)
 Important Message  Patient Details  Name: Kimberly Scott MRN: [id number] Date of Birth: [birth year]   Important Message Given:        Brookie Cantor, CMA 03/13/2024, 11:32 AM

## 2024-03-13 NOTE — Hospital Course (Signed)
 Kimberly Scott is a 89 y.o. female with medical history significant of Parkinson's disease, CAD s/p CABG (2013), TIA, mild Alzheimer's dementia, hypertension, hyperlipidemia, who presents to the ED due to ground-level fall.  Had increased weakness and poor appetite for the last few days.  Upon arrival in the hospital, he was found to have abnormal urine, urine and blood cultures was positive for E. coli ESBL.  She was treated with meropenem and switched to ertapenem.

## 2024-03-13 NOTE — Progress Notes (Signed)
 Physical Therapy Treatment Patient Details Name: Kimberly Scott MRN: [id number] DOB: [birth year] Today's Date: 03/13/2024   History of Present Illness Patient is a 89 year old female with ground level fall, UTI. History of parkinson's disease, CAD s/p CABG, TIA, mild Alzheimer's dementia, hypertension, hyperlipidemia    PT Comments  Patient is agreeable to PT session. She continues to need Min A for bed mobility and transfers. CGA provided for ambulation. Activity tolerance limited by fatigue. Recommend to continue PT to maximize independence and facilitate return to prior level of function. Discharge recommendations remain appropriate.    If plan is discharge home, recommend the following: A lot of help with walking and/or transfers;A lot of help with bathing/dressing/bathroom;Assistance with cooking/housework;Help with stairs or ramp for entrance;Supervision due to cognitive status;Direct supervision/assist for financial management;Direct supervision/assist for medications management   Can travel by private vehicle     No  Equipment Recommendations  None recommended by PT    Recommendations for Other Services       Precautions / Restrictions Precautions Precautions: Fall Restrictions Weight Bearing Restrictions Per Provider Order: No     Mobility  Bed Mobility Overal bed mobility: Needs Assistance Bed Mobility: Supine to Sit     Supine to sit: Min assist     General bed mobility comments: assistance for LE support and for scooting towards edge of bed    Transfers Overall transfer level: Needs assistance Equipment used: Rolling walker (2 wheels) Transfers: Sit to/from Stand Sit to Stand: Min assist           General transfer comment: lifting assistance required for standing. verbal cues for technique    Ambulation/Gait Ambulation/Gait assistance: Contact guard assist Gait Distance (Feet): 40 Feet Assistive device: Rolling walker (2 wheels) Gait  Pattern/deviations: Shuffle, Trunk flexed, Decreased stride length Gait velocity: decreased     General Gait Details: verbal cues for upright standing posture. further gait distance limited by fatigue   Stairs             Wheelchair Mobility     Tilt Bed    Modified Rankin (Stroke Patients Only)       Balance Overall balance assessment: Needs assistance Sitting-balance support: Feet supported Sitting balance-Leahy Scale: Fair     Standing balance support: Bilateral upper extremity supported, No upper extremity supported Standing balance-Leahy Scale: Poor Standing balance comment: CGA provided with heavy reliance on rolling walker for support                            Communication Communication Communication: No apparent difficulties  Cognition Arousal: Alert Behavior During Therapy: WFL for tasks assessed/performed   PT - Cognitive impairments: History of cognitive impairments                         Following commands: Impaired Following commands impaired: Follows one step commands with increased time    Cueing Cueing Techniques: Verbal cues, Tactile cues  Exercises      General Comments        Pertinent Vitals/Pain Pain Assessment Pain Assessment: No/denies pain    Home Living                          Prior Function            PT Goals (current goals can now be found in the care plan section) Acute Rehab PT Goals  Patient Stated Goal: to return home PT Goal Formulation: With patient Time For Goal Achievement: 03/24/24 Potential to Achieve Goals: Fair Progress towards PT goals: Progressing toward goals    Frequency    Min 2X/week      PT Plan      Co-evaluation              AM-PAC PT "6 Clicks" Mobility   Outcome Measure  Help needed turning from your back to your side while in a flat bed without using bedrails?: A Little Help needed moving from lying on your back to sitting on the side  of a flat bed without using bedrails?: A Lot Help needed moving to and from a bed to a chair (including a wheelchair)?: A Lot Help needed standing up from a chair using your arms (e.g., wheelchair or bedside chair)?: A Little Help needed to walk in hospital room?: A Little Help needed climbing 3-5 steps with a railing? : A Lot 6 Click Score: 15    End of Session Equipment Utilized During Treatment: Gait belt Activity Tolerance: Patient tolerated treatment well Patient left: in chair;with call bell/phone within reach;with chair alarm set Nurse Communication: Mobility status PT Visit Diagnosis: Unsteadiness on feet (R26.81);Muscle weakness (generalized) (M62.81)     Time: 1610-9604 PT Time Calculation (min) (ACUTE ONLY): 16 min  Charges:    $Therapeutic Activity: 8-22 mins PT General Charges $$ ACUTE PT VISIT: 1 Visit                     Kimberly Scott, PT, MPT    Kimberly Scott 03/13/2024, 1:00 PM

## 2024-03-14 DIAGNOSIS — R7881 Bacteremia: Secondary | ICD-10-CM | POA: Diagnosis not present

## 2024-03-14 DIAGNOSIS — N390 Urinary tract infection, site not specified: Secondary | ICD-10-CM | POA: Diagnosis not present

## 2024-03-14 DIAGNOSIS — G20A1 Parkinson's disease without dyskinesia, without mention of fluctuations: Secondary | ICD-10-CM | POA: Diagnosis not present

## 2024-03-14 DIAGNOSIS — E875 Hyperkalemia: Secondary | ICD-10-CM

## 2024-03-14 LAB — POTASSIUM: Potassium: 4.9 mmol/L (ref 3.5–5.1)

## 2024-03-14 LAB — BASIC METABOLIC PANEL WITH GFR
Anion gap: 4 — ABNORMAL LOW (ref 5–15)
BUN: 29 mg/dL — ABNORMAL HIGH (ref 8–23)
CO2: 27 mmol/L (ref 22–32)
Calcium: 8.5 mg/dL — ABNORMAL LOW (ref 8.9–10.3)
Chloride: 105 mmol/L (ref 98–111)
Creatinine, Ser: 1.16 mg/dL — ABNORMAL HIGH (ref 0.44–1.00)
GFR, Estimated: 44 mL/min — ABNORMAL LOW (ref >60)
Glucose, Bld: 105 mg/dL — ABNORMAL HIGH (ref 70–99)
Potassium: 5.3 mmol/L — ABNORMAL HIGH (ref 3.5–5.1)
Sodium: 136 mmol/L (ref 135–145)

## 2024-03-14 MED ORDER — ERTAPENEM IV (FOR PTA / DISCHARGE USE ONLY): 500.0000 mg | INTRAVENOUS | 0 refills | Status: AC

## 2024-03-14 MED ORDER — SODIUM ZIRCONIUM CYCLOSILICATE 10 G PO PACK
10.0000 g | PACK | Freq: Once | ORAL | Status: AC
  Administered 2024-03-14: 10 g via ORAL
  Filled 2024-03-14: qty 1

## 2024-03-14 MED ORDER — POLYETHYLENE GLYCOL 3350 17 G PO PACK: 17.0000 g | PACK | Freq: Every day | ORAL | Status: AC | PRN

## 2024-03-14 NOTE — Plan of Care (Signed)

## 2024-03-14 NOTE — Discharge Summary (Addendum)
 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 female with medical history significant of Parkinson's disease, CAD s/p CABG (2013), TIA, mild Alzheimer's dementia, hypertension, hyperlipidemia, who presents to the ED due to ground-level fall.  Had increased weakness and poor appetite for the last few days.  Upon arrival in the hospital, he was found to have abnormal urine, urine and blood cultures was positive for E. coli ESBL.  She was treated with meropenem and switched to ertapenem.         Assessment and Plan:  # E. coli ESBL bacteremia,  E. coli ESBL urinary tract infection. Presumed urinary source. No murmur. Hemodynamically stable. CT abdomen/pelvis nothing acute Antibiotic switched to ertapenem until 2/22.  Patient has improved.   Falls with generalized weakness. Currently pending nursing home placement.   # Parkinson's disease # Dementia Continue home medicines.   # Hx TIA - home asa, statin   # HTN Blood pressure medicine on hold due to   Chronic kidney disease stage IIIb. AKI rule out Renal function still stable.  Patient does not have AKI.   Class I obesity with BMI 30.31. Diet and excise.      Consultants: ID Procedures performed: None  Disposition: Skilled nursing facility Diet recommendation:   Discharge Diet Orders (From admission, onward)     Start     Ordered   03/14/24 0000  Diet - low sodium heart healthy        03/14/24 1419           Cardiac diet DISCHARGE MEDICATION: Allergies as of 03/14/2024       Reactions   Bacitracin-neomycin-polymyxin Swelling, Rash   Blisters Blisters   Ace Inhibitors    Other reaction(s): Cough   Omega 3  [fish Oil] Other (See Comments)   Neosporin [neomycin-bacitracin Zn-polymyx] Rash   Tape Rash   Blisters        Medication List     STOP taking these medications    meloxicam 7.5 MG tablet Commonly known as: MOBIC   phenol 1.4 % Liqd Commonly known as: CHLORASEPTIC   telmisartan 20 MG tablet Commonly known as: MICARDIS   traMADol 50 MG tablet Commonly known as: ULTRAM       TAKE these medications    acetaminophen 325 MG tablet Commonly known as: TYLENOL Take 2 tablets (650 mg total) by mouth every 6 (six) hours as needed for mild pain, headache or fever.   acidophilus Caps capsule Take 1 capsule by mouth daily.   ascorbic acid 500 MG tablet Commonly known as: VITAMIN C Take 2 tablets (1,000 mg total) by mouth daily.   aspirin EC 81 MG tablet Take 81 mg by mouth daily.   atorvastatin 40 MG tablet Commonly known as: LIPITOR Take 1 tablet (40 mg total) by mouth daily at 6  PM.   carbidopa-levodopa 25-100 MG tablet Commonly known as: SINEMET IR Take 2 tablets by mouth as directed. Take 2 tabs in the morning and 1 tablet at noon and dinner time   Cholecalciferol 125 MCG (5000 UT) capsule Take 5,000 Units by mouth daily.   Cranberry 4200 MG Caps Take 1 capsule by mouth daily.   cyanocobalamin 1000 MCG/ML injection Commonly known as: VITAMIN B12 Inject 1,000 mcg into the muscle every 30 (thirty) days. Last dose 11/20/21   diclofenac Sodium 1 % Gel Commonly known as: VOLTAREN Apply 2 g topically 4 (four) times daily.   ertapenem IVPB Commonly known as: INVANZ Inject 500 mg into the vein daily  for 5 days. Indication:  ESBL E coli UTI and bacteremia Last Day of Therapy:  03/19/2024 Labs - Once weekly:  CBC/D and CMP Fax weekly lab results  promptly to [phone] Method of administration: Mini-Bag Plus / Gravity Method of administration may be changed at the discretion nursing facility and/or its pharmacy Please pull PICC/midline at completion of IV antibiotics Call [phone] with any questions   estradiol 0.1 MG/GM vaginal cream Commonly known as: ESTRACE Place 1 Applicatorful vaginally 3 (three) times a week.   feeding supplement Liqd Take 237 mLs by mouth 3 (three) times daily between meals.   multivitamin with minerals Tabs tablet Take 1 tablet by mouth daily.   polyethylene glycol 17 g packet Commonly known as: MIRALAX / GLYCOLAX Take 17 g by mouth daily as needed for mild constipation.   triamcinolone ointment 0.1 % Commonly known as: KENALOG Apply 1 application topically 2 (two) times daily as needed.               Discharge Care Instructions  (From admission, onward)           Start     Ordered   03/14/24 0000  Change dressing on IV access line weekly and PRN  (Home infusion instructions - [facility] )        03/14/24 1419            Contact information for follow-up providers     Curtis Sites III, MD Follow up in 1 week(s).   Specialty: Internal Medicine Why: Hospital follow up Contact information: [address] [facility] [place] [address] [phone]              Contact information for after-discharge care     Destination     [facility] Preferred SNF .   Service: Skilled Nursing Contact information: [address] [place] [address] [phone]                    Discharge Exam: Ceasar Mons Weights   03/09/24 1128 03/10/24 0637  Weight: 67.2 kg 70.4 kg   General exam:  Appears calm and comfortable  Respiratory system: Clear to auscultation. Respiratory effort normal. Cardiovascular system: S1 & S2 heard, RRR. No JVD, murmurs, rubs, gallops or clicks. No pedal edema. Gastrointestinal system: Abdomen is nondistended, soft and nontender. No organomegaly or masses felt. Normal bowel sounds heard. Central nervous system: Alert and oriented x2. No focal neurological deficits. Extremities: Symmetric 5 x 5 power. Skin: No rashes, lesions or ulcers Psychiatry: Mood & affect appropriate.    Condition at discharge: fair  The results of significant diagnostics from this hospitalization (including imaging, microbiology, ancillary and laboratory) are listed below for reference.  Imaging Studies: CT ABDOMEN PELVIS WO CONTRAST Result Date: 03/10/2024 CLINICAL DATA:  bacteremia, presume urinary source EXAM: CT ABDOMEN AND PELVIS WITHOUT CONTRAST TECHNIQUE: Multidetector CT imaging of the abdomen and pelvis was performed following the standard protocol without IV contrast. RADIATION DOSE REDUCTION: This exam was performed according to the departmental dose-optimization program which includes automated exposure control, adjustment of the mA and/or kV according to patient size and/or use of iterative reconstruction technique. COMPARISON:  07/31/2019 FINDINGS: Lower chest: Trace pleural effusions. No pericardial effusion. Blood pool is hypodense compared to the interventricular septum suggesting anemia. Extensive coronary and aortic calcifications, post CABG. Linear scarring at the left lung base. Hepatobiliary: Gallbladder nondistended. A few scattered calcified granulomas in the right hepatic lobe. No new liver lesion or biliary ductal dilatation. Pancreas: Unremarkable. No pancreatic ductal dilatation or surrounding inflammatory changes. Spleen: Normal in size without focal abnormality. Adrenals/Urinary Tract: No adrenal mass. Symmetric renal contours without focal lesion,  urolithiasis, or hydronephrosis. Urinary bladder is distended. Stomach/Bowel: Stomach partially distended by ingested material, without acute finding. Small bowel decompressed. Post appendectomy. The colon is partially distended, without acute finding. Vascular/Lymphatic: Extensive aortoiliac calcified atheromatous plaque without aneurysm. No abdominal or pelvic adenopathy. Reproductive: Status post hysterectomy. No adnexal masses. Other: No ascites.  No free air. Musculoskeletal: Mild spondylitic changes in the lower thoracic and lumbar spine. Soft tissue thickening overlying the lower sacrum. IMPRESSION: 1. No acute findings. 2. Trace pleural effusions. 3. Coronary and Aortic Atherosclerosis (ICD10-170.0). Electronically Signed   By: Corlis Leak M.D.   On: 03/10/2024 14:35   CT Head Wo Contrast Result Date: 03/09/2024 CLINICAL DATA:  Head trauma, minor (Age >= 65y); Neck trauma (Age >= 65y). Fall. EXAM: CT HEAD WITHOUT CONTRAST CT CERVICAL SPINE WITHOUT CONTRAST TECHNIQUE: Multidetector CT imaging of the head and cervical spine was performed following the standard protocol without intravenous contrast. Multiplanar CT image reconstructions of the cervical spine were also generated. RADIATION DOSE REDUCTION: This exam was performed according to the departmental dose-optimization program which includes automated exposure control, adjustment of the mA and/or kV according to patient size and/or use of iterative reconstruction technique. COMPARISON:  CT head 03/24/2023.  MRI cervical spine 04/29/2022. FINDINGS: CT HEAD FINDINGS Brain: There is no evidence of an acute infarct, intracranial hemorrhage, mass, midline shift, or extra-axial fluid collection. Confluent cerebral white matter hypodensities are unchanged and nonspecific but compatible with severe chronic small vessel ischemic disease. Mild cerebral atrophy is considered to be within normal limits for age. Vascular: Calcified atherosclerosis at the skull base.  No hyperdense vessel. Skull: No acute fracture or suspicious lesion. Sinuses/Orbits: Paranasal sinuses and mastoid air cells are clear. Bilateral cataract extraction. Other: None. CT CERVICAL SPINE FINDINGS Alignment: Unchanged trace anterolisthesis of C4 on C5 and 4 mm anterolisthesis of C5 on C6. Skull base and vertebrae: No acute fracture or destructive lesion. Soft tissues and spinal canal: No prevertebral fluid or swelling. No visible canal hematoma. Disc levels: Moderate to severe disc degeneration at C6-7 and mild to moderate degeneration at C5-6. Moderately advanced multilevel facet arthrosis, with facet ankylosis on the left at C4-5 and on the right at C5-6. Moderate right neural foraminal stenosis at C5-6 and C6-7. No evidence of high-grade spinal canal stenosis. Upper chest: No mass or consolidation in the included lung apices. Other: Prominent atherosclerotic calcification at the carotid bifurcations. IMPRESSION: 1. No evidence of acute intracranial abnormality. 2. Severe chronic small vessel ischemic disease. 3. No acute cervical spine fracture or traumatic malalignment. Electronically Signed  By: Sebastian Ache M.D.   On: 03/09/2024 14:28   CT Cervical Spine Wo Contrast Result Date: 03/09/2024 CLINICAL DATA:  Head trauma, minor (Age >= 65y); Neck trauma (Age >= 65y). Fall. EXAM: CT HEAD WITHOUT CONTRAST CT CERVICAL SPINE WITHOUT CONTRAST TECHNIQUE: Multidetector CT imaging of the head and cervical spine was performed following the standard protocol without intravenous contrast. Multiplanar CT image reconstructions of the cervical spine were also generated. RADIATION DOSE REDUCTION: This exam was performed according to the departmental dose-optimization program which includes automated exposure control, adjustment of the mA and/or kV according to patient size and/or use of iterative reconstruction technique. COMPARISON:  CT head 03/24/2023.  MRI cervical spine 04/29/2022. FINDINGS: CT HEAD FINDINGS  Brain: There is no evidence of an acute infarct, intracranial hemorrhage, mass, midline shift, or extra-axial fluid collection. Confluent cerebral white matter hypodensities are unchanged and nonspecific but compatible with severe chronic small vessel ischemic disease. Mild cerebral atrophy is considered to be within normal limits for age. Vascular: Calcified atherosclerosis at the skull base. No hyperdense vessel. Skull: No acute fracture or suspicious lesion. Sinuses/Orbits: Paranasal sinuses and mastoid air cells are clear. Bilateral cataract extraction. Other: None. CT CERVICAL SPINE FINDINGS Alignment: Unchanged trace anterolisthesis of C4 on C5 and 4 mm anterolisthesis of C5 on C6. Skull base and vertebrae: No acute fracture or destructive lesion. Soft tissues and spinal canal: No prevertebral fluid or swelling. No visible canal hematoma. Disc levels: Moderate to severe disc degeneration at C6-7 and mild to moderate degeneration at C5-6. Moderately advanced multilevel facet arthrosis, with facet ankylosis on the left at C4-5 and on the right at C5-6. Moderate right neural foraminal stenosis at C5-6 and C6-7. No evidence of high-grade spinal canal stenosis. Upper chest: No mass or consolidation in the included lung apices. Other: Prominent atherosclerotic calcification at the carotid bifurcations. IMPRESSION: 1. No evidence of acute intracranial abnormality. 2. Severe chronic small vessel ischemic disease. 3. No acute cervical spine fracture or traumatic malalignment. Electronically Signed   By: Sebastian Ache M.D.   On: 03/09/2024 14:28   DG Hip Unilat With Pelvis 2-3 Views Right Result Date: 03/09/2024 CLINICAL DATA:  Larey Seat EXAM: DG HIP (WITH OR WITHOUT PELVIS) 2-3V RIGHT COMPARISON:  03/24/2023 FINDINGS: There is no evidence of hip fracture or dislocation. There is no evidence of arthropathy or other focal bone abnormality. Extensive femoral arterial calcifications. IMPRESSION: Negative. Electronically  Signed   By: Corlis Leak M.D.   On: 03/09/2024 13:18   DG Chest 1 View Result Date: 03/09/2024 CLINICAL DATA:  Larey Seat EXAM: CHEST  1 VIEW COMPARISON:  12/07/2021 FINDINGS: Prominent perihilar interstitial markings left greater than right. No confluent airspace disease. No pneumothorax. Heart size upper limits normal. Aortic Atherosclerosis (ICD10-170.0). Post CABG. IMPRESSION: Prominent perihilar interstitial markings left greater than right. Electronically Signed   By: Corlis Leak M.D.   On: 03/09/2024 13:17   DG Shoulder Right Result Date: 03/09/2024 CLINICAL DATA:  Larey Seat EXAM: RIGHT SHOULDER - 2+ VIEW COMPARISON:  12/09/2021 FINDINGS: No fracture or dislocation.  Mild diffuse osteopenia.  Post CABG. IMPRESSION: No acute findings. Electronically Signed   By: Corlis Leak M.D.   On: 03/09/2024 13:16    Microbiology: Results for orders placed or performed during the hospital encounter of 03/09/24  Urine Culture     Status: Abnormal   Collection Time: 03/09/24  3:34 PM   Specimen: Urine, Random  Result Value Ref Range Status   Specimen Description   Final  URINE, RANDOM Performed at [facility], [address] [place], [place] [address]    Special Requests   Final    NONE Reflexed from [id number] Performed at [facility], [address] [place], [place] [address]    Culture (A)  Final    >=100,000 COLONIES/mL ESCHERICHIA COLI Confirmed Extended Spectrum Beta-Lactamase Producer (ESBL).  In bloodstream infections from ESBL organisms, carbapenems are preferred over piperacillin/tazobactam. They are shown to have a lower risk of mortality.    Report Status 03/11/2024 FINAL  Final   Organism ID, Bacteria ESCHERICHIA COLI (A)  Final      Susceptibility   Escherichia coli - MIC*    AMPICILLIN >=32 RESISTANT Resistant     CEFAZOLIN >=64 RESISTANT Resistant     CEFEPIME 8 INTERMEDIATE Intermediate     CEFTRIAXONE >=64 RESISTANT Resistant     CIPROFLOXACIN <=0.25 SENSITIVE  Sensitive     GENTAMICIN >=16 RESISTANT Resistant     IMIPENEM 0.5 SENSITIVE Sensitive     NITROFURANTOIN 32 SENSITIVE Sensitive     TRIMETH/SULFA >=320 RESISTANT Resistant     AMPICILLIN/SULBACTAM >=32 RESISTANT Resistant     PIP/TAZO <=4 SENSITIVE Sensitive ug/mL    * >=100,000 COLONIES/mL ESCHERICHIA COLI  Blood culture (routine x 2)     Status: Abnormal   Collection Time: 03/09/24  4:36 PM   Specimen: BLOOD RIGHT ARM  Result Value Ref Range Status   Specimen Description   Final    BLOOD RIGHT ARM Performed at [facility], [address] [place], [place] [address]    Special Requests   Final    BOTTLES DRAWN AEROBIC AND ANAEROBIC Blood Culture results may not be optimal due to an inadequate volume of blood received in culture bottles Performed at [facility], [address] [place], [place] [address]    Culture  Setup Time   Final    IN BOTH AEROBIC AND ANAEROBIC BOTTLES GRAM NEGATIVE RODS CRITICAL RESULT CALLED TO, READ BACK BY AND VERIFIED WITH: JASON ROBBINS @ 0531 03/10/24 [facility] Performed at [facility], [address] [place], [place] [address]    Culture (A)  Final    ESCHERICHIA COLI Confirmed Extended Spectrum Beta-Lactamase Producer (ESBL).  In bloodstream infections from ESBL organisms, carbapenems are preferred over piperacillin/tazobactam. They are shown to have a lower risk of mortality.    Report Status 03/12/2024 FINAL  Final   Organism ID, Bacteria ESCHERICHIA COLI  Final      Susceptibility   Escherichia coli - MIC*    AMPICILLIN >=32 RESISTANT Resistant     CEFEPIME 16 RESISTANT Resistant     CEFTAZIDIME RESISTANT Resistant     CEFTRIAXONE >=64 RESISTANT Resistant     CIPROFLOXACIN 0.5 INTERMEDIATE Intermediate     GENTAMICIN >=16 RESISTANT Resistant     IMIPENEM <=0.25 SENSITIVE Sensitive     TRIMETH/SULFA >=320 RESISTANT Resistant     AMPICILLIN/SULBACTAM >=32 RESISTANT Resistant     PIP/TAZO <=4 SENSITIVE Sensitive ug/mL     * ESCHERICHIA COLI  Blood culture (routine x 2)     Status: Abnormal   Collection Time: 03/09/24  4:36 PM   Specimen: BLOOD LEFT ARM  Result Value Ref Range Status   Specimen Description   Final    BLOOD LEFT ARM Performed at [facility], [address], [place], [place] [address]    Special Requests   Final    BOTTLES DRAWN AEROBIC AND ANAEROBIC Blood Culture results may not be  optimal due to an inadequate volume of blood received in culture bottles Performed at [facility], [address] [place], [place] [address]    Culture  Setup Time   Final    IN BOTH AEROBIC AND ANAEROBIC BOTTLES GRAM NEGATIVE RODS CRITICAL RESULT CALLED TO, READ BACK BY AND VERIFIED WITH: JASON ROBBINS @ 0531 03/10/24 [facility] CRITICAL VALUE NOTED.  VALUE IS CONSISTENT WITH PREVIOUSLY REPORTED AND CALLED VALUE.    Culture (A)  Final    ESCHERICHIA COLI SUSCEPTIBILITIES PERFORMED ON PREVIOUS CULTURE WITHIN THE LAST 5 DAYS. Performed at [facility], [address] [place], [place] [address]    Report Status 03/12/2024 FINAL  Final  Blood Culture ID Panel (Reflexed)     Status: Abnormal   Collection Time: 03/09/24  4:36 PM  Result Value Ref Range Status   Enterococcus faecalis NOT DETECTED NOT DETECTED Final   Enterococcus Faecium NOT DETECTED NOT DETECTED Final   Listeria monocytogenes NOT DETECTED NOT DETECTED Final   Staphylococcus species NOT DETECTED NOT DETECTED Final   Staphylococcus aureus (BCID) NOT DETECTED NOT DETECTED Final   Staphylococcus epidermidis NOT DETECTED NOT DETECTED Final   Staphylococcus lugdunensis NOT DETECTED NOT DETECTED Final   Streptococcus species NOT DETECTED NOT DETECTED Final   Streptococcus agalactiae NOT DETECTED NOT DETECTED Final   Streptococcus pneumoniae NOT DETECTED NOT DETECTED Final   Streptococcus pyogenes NOT DETECTED NOT DETECTED Final   A.calcoaceticus-baumannii NOT DETECTED NOT DETECTED Final   Bacteroides fragilis NOT DETECTED  NOT DETECTED Final   Enterobacterales DETECTED (A) NOT DETECTED Final    Comment: Enterobacterales represent a large order of gram negative bacteria, not a single organism. CRITICAL RESULT CALLED TO, READ BACK BY AND VERIFIED WITH: JASON ROBBINS @ 0531 03/10/24 [facility]    Enterobacter cloacae complex NOT DETECTED NOT DETECTED Final   Escherichia coli DETECTED (A) NOT DETECTED Final    Comment: CRITICAL RESULT CALLED TO, READ BACK BY AND VERIFIED WITH: JASON ROBBINS @ 0531 03/10/24 [facility]    Klebsiella aerogenes NOT DETECTED NOT DETECTED Final   Klebsiella oxytoca NOT DETECTED NOT DETECTED Final   Klebsiella pneumoniae NOT DETECTED NOT DETECTED Final   Proteus species NOT DETECTED NOT DETECTED Final   Salmonella species NOT DETECTED NOT DETECTED Final   Serratia marcescens NOT DETECTED NOT DETECTED Final   Haemophilus influenzae NOT DETECTED NOT DETECTED Final   Neisseria meningitidis NOT DETECTED NOT DETECTED Final   Pseudomonas aeruginosa NOT DETECTED NOT DETECTED Final   Stenotrophomonas maltophilia NOT DETECTED NOT DETECTED Final   Candida albicans NOT DETECTED NOT DETECTED Final   Candida auris NOT DETECTED NOT DETECTED Final   Candida glabrata NOT DETECTED NOT DETECTED Final   Candida krusei NOT DETECTED NOT DETECTED Final   Candida parapsilosis NOT DETECTED NOT DETECTED Final   Candida tropicalis NOT DETECTED NOT DETECTED Final   Cryptococcus neoformans/gattii NOT DETECTED NOT DETECTED Final   CTX-M ESBL DETECTED (A) NOT DETECTED Final    Comment: CRITICAL RESULT CALLED TO, READ BACK BY AND VERIFIED WITH: JASON ROBBINS @ 0531 03/10/24 [facility] (NOTE) Extended spectrum beta-lactamase detected. Recommend a carbapenem as initial therapy.      Carbapenem resistance IMP NOT DETECTED NOT DETECTED Final   Carbapenem resistance KPC NOT DETECTED NOT DETECTED Final   Carbapenem resistance NDM NOT DETECTED NOT DETECTED Final   Carbapenem resist OXA 48 LIKE NOT DETECTED NOT DETECTED Final    Carbapenem resistance VIM NOT DETECTED NOT DETECTED Final    Comment: Performed at  [facility], [address] [place], [place] [address]  Culture, blood (Routine X 2) w Reflex to ID Panel     Status: None (Preliminary result)   Collection Time: 03/11/24  3:30 PM   Specimen: BLOOD  Result Value Ref Range Status   Specimen Description BLOOD BLOOD RIGHT FOREARM  Final   Special Requests   Final    BOTTLES DRAWN AEROBIC ONLY Blood Culture results may not be optimal due to an inadequate volume of blood received in culture bottles   Culture   Final    NO GROWTH 3 DAYS Performed at [facility], [address] [place], [place] [address]    Report Status PENDING  Incomplete  Culture, blood (Routine X 2) w Reflex to ID Panel     Status: None (Preliminary result)   Collection Time: 03/11/24  3:43 PM   Specimen: BLOOD  Result Value Ref Range Status   Specimen Description BLOOD BLOOD LEFT FOREARM  Final   Special Requests   Final    BOTTLES DRAWN AEROBIC AND ANAEROBIC Blood Culture adequate volume   Culture   Final    NO GROWTH 3 DAYS Performed at [facility], [address] [place], [place] [address]    Report Status PENDING  Incomplete    Labs: CBC: Recent Labs  Lab 03/09/24 1239 03/10/24 0154 03/11/24 0532  WBC 9.5 9.3 6.8  HGB 11.6* 10.4* 10.9*  HCT 35.0* 31.9* 32.5*  MCV 96.2 98.5 95.0  PLT 228 197 200   Basic Metabolic Panel: Recent Labs  Lab 03/10/24 0154 03/11/24 0532 03/12/24 0432 03/13/24 0228 03/14/24 0533 03/14/24 1324  NA 137 133* 137 135 136  --   K 4.4 4.7 4.3 4.6 5.3* 4.9  CL 108 105 107 106 105  --   CO2 23 23 26 25 27   --   GLUCOSE 146* 114* 117* 122* 105*  --   BUN 32* 24* 21 27* 29*  --   CREATININE 1.25* 1.17* 1.10* 1.09* 1.16*  --   CALCIUM 8.4* 8.5* 8.4* 8.2* 8.5*  --   MG 2.0  --   --   --   --   --    Liver Function Tests: Recent Labs  Lab 03/09/24 1239  AST 25  ALT 11  ALKPHOS 50  BILITOT 1.2  PROT  7.3  ALBUMIN 3.9   CBG: No results for input(s): "GLUCAP" in the last 168 hours.  Discharge time spent: greater than 30 minutes.  Signed: Donaciano Frizzle, MD [facility] 03/14/2024

## 2024-03-14 NOTE — TOC Transition Note (Signed)
 Transition of Care [facility]) - Discharge Note   Patient Details  Name: Kimberly Scott MRN: [id number] Date of Birth: 1928/06/21  Transition of Care [facility]) CM/SW Contact:  Elsie Halo, RN Phone Number: 03/14/2024, 10:56 AM   Clinical Narrative:     Patient is medically clear for dc to [facility] for STR. Transportation arranged with [facility]. TOC spoke with the patient's daughter Caryl Clas and she is agreeable with the DC plan.   No other TOC needs identified.    Final next level of care: Skilled Nursing Facility Barriers to Discharge: Continued Medical Work up   Patient Goals and CMS Choice            Discharge Placement              Patient chooses bed at: [facility] Patient to be transferred to facility by: Ephraim Hash Name of family member notified: Ova Bloomer Patient and family notified of of transfer: 03/14/24  Discharge Plan and Services Additional resources added to the After Visit Summary for                                       Social Drivers of Health (SDOH) Interventions SDOH Screenings   Food Insecurity: No Food Insecurity (03/10/2024)  Housing: Low Risk  (03/10/2024)  Transportation Needs: No Transportation Needs (03/10/2024)  Utilities: Not At Risk (03/10/2024)  Financial Resource Strain: Low Risk  (02/26/2024)   Received from [facility]  Physical Activity: Unknown (07/31/2019)  Social Connections: Patient Unable To Answer (03/10/2024)  Tobacco Use: Low Risk  (03/09/2024)     Readmission Risk Interventions     No data to display

## 2024-03-16 LAB — CULTURE, BLOOD (ROUTINE X 2)
Culture: NO GROWTH
Culture: NO GROWTH
Special Requests: ADEQUATE

## 2024-10-03 ENCOUNTER — Inpatient Hospital Stay
Admission: EM | Admit: 2024-10-03 | Discharge: 2024-10-10 | DRG: 394 | Disposition: A | Discharge status: Skilled Nursing Facility | Attending: Internal Medicine | Admitting: Internal Medicine

## 2024-10-03 ENCOUNTER — Other Ambulatory Visit: Payer: Self-pay

## 2024-10-03 DIAGNOSIS — Z79899 Other long term (current) drug therapy: Secondary | ICD-10-CM

## 2024-10-03 DIAGNOSIS — Z87440 Personal history of urinary (tract) infections: Secondary | ICD-10-CM

## 2024-10-03 DIAGNOSIS — I4510 Unspecified right bundle-branch block: Secondary | ICD-10-CM | POA: Diagnosis present

## 2024-10-03 DIAGNOSIS — R54 Age-related physical debility: Secondary | ICD-10-CM | POA: Diagnosis present

## 2024-10-03 DIAGNOSIS — Z66 Do not resuscitate: Secondary | ICD-10-CM | POA: Diagnosis present

## 2024-10-03 DIAGNOSIS — K6289 Other specified diseases of anus and rectum: Secondary | ICD-10-CM | POA: Diagnosis not present

## 2024-10-03 DIAGNOSIS — Z85828 Personal history of other malignant neoplasm of skin: Secondary | ICD-10-CM

## 2024-10-03 DIAGNOSIS — Z90710 Acquired absence of both cervix and uterus: Secondary | ICD-10-CM

## 2024-10-03 DIAGNOSIS — Z951 Presence of aortocoronary bypass graft: Secondary | ICD-10-CM

## 2024-10-03 DIAGNOSIS — R410 Disorientation, unspecified: Secondary | ICD-10-CM

## 2024-10-03 DIAGNOSIS — F0280 Dementia in other diseases classified elsewhere without behavioral disturbance: Secondary | ICD-10-CM | POA: Diagnosis present

## 2024-10-03 DIAGNOSIS — N289 Disorder of kidney and ureter, unspecified: Secondary | ICD-10-CM | POA: Diagnosis present

## 2024-10-03 DIAGNOSIS — G20A1 Parkinson's disease without dyskinesia, without mention of fluctuations: Secondary | ICD-10-CM | POA: Diagnosis present

## 2024-10-03 DIAGNOSIS — E785 Hyperlipidemia, unspecified: Secondary | ICD-10-CM | POA: Diagnosis present

## 2024-10-03 DIAGNOSIS — Z881 Allergy status to other antibiotic agents status: Secondary | ICD-10-CM

## 2024-10-03 DIAGNOSIS — N390 Urinary tract infection, site not specified: Principal | ICD-10-CM | POA: Diagnosis present

## 2024-10-03 DIAGNOSIS — I2510 Atherosclerotic heart disease of native coronary artery without angina pectoris: Secondary | ICD-10-CM | POA: Diagnosis present

## 2024-10-03 DIAGNOSIS — Z96651 Presence of right artificial knee joint: Secondary | ICD-10-CM | POA: Diagnosis present

## 2024-10-03 DIAGNOSIS — Z888 Allergy status to other drugs, medicaments and biological substances status: Secondary | ICD-10-CM

## 2024-10-03 DIAGNOSIS — Z91048 Other nonmedicinal substance allergy status: Secondary | ICD-10-CM

## 2024-10-03 DIAGNOSIS — K5641 Fecal impaction: Principal | ICD-10-CM | POA: Diagnosis present

## 2024-10-03 DIAGNOSIS — R001 Bradycardia, unspecified: Secondary | ICD-10-CM | POA: Diagnosis not present

## 2024-10-03 DIAGNOSIS — I10 Essential (primary) hypertension: Secondary | ICD-10-CM | POA: Diagnosis present

## 2024-10-03 DIAGNOSIS — Z7982 Long term (current) use of aspirin: Secondary | ICD-10-CM

## 2024-10-03 DIAGNOSIS — Z9049 Acquired absence of other specified parts of digestive tract: Secondary | ICD-10-CM

## 2024-10-03 DIAGNOSIS — R509 Fever, unspecified: Secondary | ICD-10-CM

## 2024-10-03 LAB — CBC
HCT: 34.3 % — ABNORMAL LOW (ref 36.0–46.0)
Hemoglobin: 11 g/dL — ABNORMAL LOW (ref 12.0–15.0)
MCH: 31.3 pg (ref 26.0–34.0)
MCHC: 32.1 g/dL (ref 30.0–36.0)
MCV: 97.4 fL (ref 80.0–100.0)
Platelets: 233 K/uL (ref 150–400)
RBC: 3.52 MIL/uL — ABNORMAL LOW (ref 3.87–5.11)
RDW: 13.5 % (ref 11.5–15.5)
WBC: 14.3 K/uL — ABNORMAL HIGH (ref 4.0–10.5)
nRBC: 0 % (ref 0.0–0.2)

## 2024-10-03 LAB — COMPREHENSIVE METABOLIC PANEL WITH GFR
ALT: 6 U/L (ref 0–44)
AST: 32 U/L (ref 15–41)
Albumin: 3.9 g/dL (ref 3.5–5.0)
Alkaline Phosphatase: 52 U/L (ref 38–126)
Anion gap: 12 (ref 5–15)
BUN: 29 mg/dL — ABNORMAL HIGH (ref 8–23)
CO2: 21 mmol/L — ABNORMAL LOW (ref 22–32)
Calcium: 9.3 mg/dL (ref 8.9–10.3)
Chloride: 104 mmol/L (ref 98–111)
Creatinine, Ser: 1.29 mg/dL — ABNORMAL HIGH (ref 0.44–1.00)
GFR, Estimated: 38 mL/min — ABNORMAL LOW (ref >60)
Glucose, Bld: 101 mg/dL — ABNORMAL HIGH (ref 70–99)
Potassium: 4.1 mmol/L (ref 3.5–5.1)
Sodium: 137 mmol/L (ref 135–145)
Total Bilirubin: 1.2 mg/dL (ref 0.0–1.2)
Total Protein: 7.5 g/dL (ref 6.5–8.1)

## 2024-10-03 LAB — LIPASE, BLOOD: Lipase: 29 U/L (ref 11–51)

## 2024-10-03 MED ORDER — LACTATED RINGERS IV BOLUS
1000.0000 mL | Freq: Once | INTRAVENOUS | Status: AC
  Administered 2024-10-03: 1000 mL via INTRAVENOUS

## 2024-10-03 NOTE — ED Provider Notes (Signed)
 Kimberly Scott Provider Note    Event Date/Time   First MD Initiated Contact with Patient 10/03/24 2302     (approximate)   History   Fecal Impaction and Dysuria   HPI  Kimberly Scott is a 89 y.o. female history of Parkinson's, dementia, peripheral neuropathy, hypertension, hyperlipidemia, GERD, presenting with constipation.  Daughter states that she has been constipated for several days.  Tried suppository and MiraLAX  without improvement.  Patient had also been complaining about some dysuria and lower abdominal burning.  No nausea or vomiting.  Daughter had also noted that she was slightly confused at home, thought she was seeing another woman in the house.  States that she does have history of constipation but not this severe.  No recent trauma or falls.  She denies any pain anywhere, no shortness of breath or cough.  Independent history obtained from daughter as above.  Independent history from EMS, she had a temp of 101.  Blood glucose was 165.    On independent chart review, she was seen by neurology in early November, has history of ongoing Parkinson's disease, is on Sinemet  as well as tramadol  as needed for lower back pain and toe pain.     Physical Exam   Triage Vital Signs: ED Triage Vitals  Encounter Vitals Group     BP 10/03/24 1936 (!) 188/65     Girls Systolic BP Percentile --      Girls Diastolic BP Percentile --      Boys Systolic BP Percentile --      Boys Diastolic BP Percentile --      Pulse Rate 10/03/24 1936 73     Resp 10/03/24 1936 16     Temp 10/03/24 1936 98 F (36.7 C)     Temp Source 10/03/24 1936 Oral     SpO2 10/03/24 1936 97 %     Weight 10/03/24 1936 138 lb (62.6 kg)     Height 10/03/24 1936 5' 2 (1.575 m)     Head Circumference --      Peak Flow --      Pain Score 10/03/24 1942 0     Pain Loc --      Pain Education --      Exclude from Growth Chart --     Most recent vital signs: Vitals:   10/04/24 0157  10/04/24 0200  BP:  (!) 168/57  Pulse:  80  Resp:  15  Temp: 97.9 F (36.6 C) 98.4 F (36.9 C)  SpO2:  98%     General: Awake, no distress.  CV:  Good peripheral perfusion.  Resp:  Normal effort.  Abd:  No distention.  Soft nontender Other:  Mildly dry oral mucosa   ED Results / Procedures / Treatments   Labs (all labs ordered are listed, but only abnormal results are displayed) Labs Reviewed  COMPREHENSIVE METABOLIC PANEL WITH GFR - Abnormal; Notable for the following components:      Result Value   CO2 21 (*)    Glucose, Bld 101 (*)    BUN 29 (*)    Creatinine, Ser 1.29 (*)    GFR, Estimated 38 (*)    All other components within normal limits  CBC - Abnormal; Notable for the following components:   WBC 14.3 (*)    RBC 3.52 (*)    Hemoglobin 11.0 (*)    HCT 34.3 (*)    All other components within normal limits  URINALYSIS, ROUTINE  W REFLEX MICROSCOPIC - Abnormal; Notable for the following components:   Color, Urine YELLOW (*)    APPearance HAZY (*)    Ketones, ur 5 (*)    Nitrite POSITIVE (*)    Leukocytes,Ua MODERATE (*)    Bacteria, UA MANY (*)    Non Squamous Epithelial PRESENT (*)    All other components within normal limits  LIPASE, BLOOD      RADIOLOGY On my independent interpretation, CT without obvious SBO   PROCEDURES:  Critical Care performed: No  Procedures   MEDICATIONS ORDERED IN ED: Medications  enoxaparin  (LOVENOX ) injection 40 mg (has no administration in time range)  acetaminophen  (TYLENOL ) tablet 650 mg (has no administration in time range)    Or  acetaminophen  (TYLENOL ) suppository 650 mg (has no administration in time range)  ondansetron  (ZOFRAN ) tablet 4 mg (has no administration in time range)    Or  ondansetron  (ZOFRAN ) injection 4 mg (has no administration in time range)  HYDROcodone -acetaminophen  (NORCO/VICODIN) 5-325 MG per tablet 1-2 tablet (has no administration in time range)  morphine  (PF) 2 MG/ML injection 2 mg  (has no administration in time range)  lactated ringers  bolus 1,000 mL (0 mLs Intravenous Stopped 10/04/24 0202)  iohexol  (OMNIPAQUE ) 300 MG/ML solution 80 mL (80 mLs Intravenous Contrast Given 10/04/24 0027)  meropenem  (MERREM ) 1 g in sodium chloride  0.9 % 100 mL IVPB (0 g Intravenous Stopped 10/04/24 0432)     IMPRESSION / MDM / ASSESSMENT AND PLAN / ED COURSE  I reviewed the triage vital signs and the nursing notes.                              Differential diagnosis includes, but is not limited to, UTI, constipation, mass, SBO, stercoral colitis, dehydration, electrolyte derangements.  Labs, UA, CT.  IV fluids.  Patient's presentation is most consistent with acute presentation with potential threat to life or bodily function.  Independent interpretation of labs and imaging below.  Given history of ESBL UTI and dysuria, will start her on IV meropenem .  She will need to be admitted for further management.  Consult to hospitalist who will admit the patient.  She is admitted.    Clinical Course as of 10/04/24 9487  [facility] Oct 04, 2024  0056 Independent review of labs, leukocytosis noted, mild AKI, electrolytes not severely deranged, LFTs are normal, UA is consistent with UTI. [TT]  0058 On independent chart review, patient has history of ESBL UTI, previously on meropenem .  Will put in a pharmacy consult for meropenem . [TT]  0113 CT ABDOMEN PELVIS W CONTRAST 1. Infectious or inflammatory proctitis.  [TT]    Clinical Course User Index [TT] Waymond Lorelle Cummins, MD     FINAL CLINICAL IMPRESSION(S) / ED DIAGNOSES   Final diagnoses:  Proctitis  Urinary tract infection without hematuria, site unspecified  Fever, unspecified fever cause  Confusion     Rx / DC Orders   ED Discharge Orders     None        Note:  This document was prepared using Dragon voice recognition software and may include unintentional dictation errors.    Waymond Lorelle Cummins, MD 10/04/24 [phone]

## 2024-10-03 NOTE — ED Triage Notes (Signed)
 Pt comes via EMS from home with UTI symptoms and constipation. Pt states this started 2 day for constipation and UTI stuff today. Family tried otc meds for constipation but no relief.  CBG 165 TEmp 101  Pt has dementia per family but no dx yet.

## 2024-10-03 NOTE — ED Triage Notes (Signed)
 Patient now out of the bathroom to be triaged. Patient endorses feel mass at her rectum and unable to pass stool. Family also endorses some confusion at home and possible UTI. Family also states she has not had any home meds today.

## 2024-10-04 ENCOUNTER — Emergency Department

## 2024-10-04 DIAGNOSIS — N289 Disorder of kidney and ureter, unspecified: Secondary | ICD-10-CM | POA: Diagnosis present

## 2024-10-04 DIAGNOSIS — Z66 Do not resuscitate: Secondary | ICD-10-CM | POA: Diagnosis present

## 2024-10-04 DIAGNOSIS — Z85828 Personal history of other malignant neoplasm of skin: Secondary | ICD-10-CM | POA: Diagnosis not present

## 2024-10-04 DIAGNOSIS — Z7982 Long term (current) use of aspirin: Secondary | ICD-10-CM | POA: Diagnosis not present

## 2024-10-04 DIAGNOSIS — I10 Essential (primary) hypertension: Secondary | ICD-10-CM | POA: Diagnosis present

## 2024-10-04 DIAGNOSIS — Z79899 Other long term (current) drug therapy: Secondary | ICD-10-CM | POA: Diagnosis not present

## 2024-10-04 DIAGNOSIS — G20A1 Parkinson's disease without dyskinesia, without mention of fluctuations: Secondary | ICD-10-CM | POA: Diagnosis present

## 2024-10-04 DIAGNOSIS — Z951 Presence of aortocoronary bypass graft: Secondary | ICD-10-CM | POA: Diagnosis not present

## 2024-10-04 DIAGNOSIS — I2510 Atherosclerotic heart disease of native coronary artery without angina pectoris: Secondary | ICD-10-CM | POA: Diagnosis present

## 2024-10-04 DIAGNOSIS — N390 Urinary tract infection, site not specified: Secondary | ICD-10-CM | POA: Diagnosis present

## 2024-10-04 DIAGNOSIS — Z96651 Presence of right artificial knee joint: Secondary | ICD-10-CM | POA: Diagnosis present

## 2024-10-04 DIAGNOSIS — Z888 Allergy status to other drugs, medicaments and biological substances status: Secondary | ICD-10-CM | POA: Diagnosis not present

## 2024-10-04 DIAGNOSIS — K6289 Other specified diseases of anus and rectum: Secondary | ICD-10-CM | POA: Diagnosis present

## 2024-10-04 DIAGNOSIS — K5641 Fecal impaction: Secondary | ICD-10-CM | POA: Diagnosis present

## 2024-10-04 DIAGNOSIS — R410 Disorientation, unspecified: Secondary | ICD-10-CM | POA: Diagnosis present

## 2024-10-04 DIAGNOSIS — Z881 Allergy status to other antibiotic agents status: Secondary | ICD-10-CM | POA: Diagnosis not present

## 2024-10-04 DIAGNOSIS — R54 Age-related physical debility: Secondary | ICD-10-CM | POA: Diagnosis present

## 2024-10-04 DIAGNOSIS — Z87440 Personal history of urinary (tract) infections: Secondary | ICD-10-CM | POA: Diagnosis not present

## 2024-10-04 DIAGNOSIS — F0280 Dementia in other diseases classified elsewhere without behavioral disturbance: Secondary | ICD-10-CM | POA: Diagnosis present

## 2024-10-04 DIAGNOSIS — Z9049 Acquired absence of other specified parts of digestive tract: Secondary | ICD-10-CM | POA: Diagnosis not present

## 2024-10-04 DIAGNOSIS — I4510 Unspecified right bundle-branch block: Secondary | ICD-10-CM | POA: Diagnosis present

## 2024-10-04 DIAGNOSIS — R001 Bradycardia, unspecified: Secondary | ICD-10-CM | POA: Diagnosis not present

## 2024-10-04 DIAGNOSIS — E785 Hyperlipidemia, unspecified: Secondary | ICD-10-CM | POA: Diagnosis present

## 2024-10-04 DIAGNOSIS — Z91048 Other nonmedicinal substance allergy status: Secondary | ICD-10-CM | POA: Diagnosis not present

## 2024-10-04 DIAGNOSIS — Z90710 Acquired absence of both cervix and uterus: Secondary | ICD-10-CM | POA: Diagnosis not present

## 2024-10-04 LAB — URINALYSIS, ROUTINE W REFLEX MICROSCOPIC
Bilirubin Urine: NEGATIVE
Glucose, UA: NEGATIVE mg/dL
Hgb urine dipstick: NEGATIVE
Ketones, ur: 5 mg/dL — AB
Nitrite: POSITIVE — AB
Protein, ur: NEGATIVE mg/dL
Specific Gravity, Urine: 1.013 (ref 1.005–1.030)
pH: 5 (ref 5.0–8.0)

## 2024-10-04 MED ORDER — LACTATED RINGERS IV SOLN: INTRAVENOUS | Status: AC

## 2024-10-04 MED ORDER — CARBIDOPA-LEVODOPA 25-100 MG PO TABS
2.0000 | ORAL_TABLET | Freq: Every day | ORAL | Status: DC
  Administered 2024-10-05 – 2024-10-10 (×6): 2 via ORAL
  Filled 2024-10-04 (×6): qty 2

## 2024-10-04 MED ORDER — ONDANSETRON HCL 4 MG PO TABS
4.0000 mg | ORAL_TABLET | Freq: Four times a day (QID) | ORAL | Status: DC | PRN
Start: 2024-10-04 — End: 2024-10-10

## 2024-10-04 MED ORDER — MORPHINE SULFATE (PF) 2 MG/ML IV SOLN: 2.0000 mg | INTRAVENOUS | Status: DC | PRN

## 2024-10-04 MED ORDER — ENOXAPARIN SODIUM 30 MG/0.3ML IJ SOSY
30.0000 mg | PREFILLED_SYRINGE | INTRAMUSCULAR | Status: DC
  Administered 2024-10-04 – 2024-10-10 (×7): 30 mg via SUBCUTANEOUS
  Filled 2024-10-04 (×7): qty 0.3

## 2024-10-04 MED ORDER — ASPIRIN 81 MG PO TBEC
81.0000 mg | DELAYED_RELEASE_TABLET | Freq: Every day | ORAL | Status: DC
  Administered 2024-10-04 – 2024-10-10 (×7): 81 mg via ORAL
  Filled 2024-10-04 (×7): qty 1

## 2024-10-04 MED ORDER — ORAL CARE MOUTH RINSE: 15.0000 mL | OROMUCOSAL | Status: DC | PRN

## 2024-10-04 MED ORDER — ACETAMINOPHEN 325 MG PO TABS: 650.0000 mg | ORAL_TABLET | Freq: Four times a day (QID) | ORAL | Status: DC | PRN

## 2024-10-04 MED ORDER — POLYETHYLENE GLYCOL 3350 17 G PO PACK: 17.0000 g | PACK | Freq: Every day | ORAL | Status: DC | PRN

## 2024-10-04 MED ORDER — IOHEXOL 300 MG/ML  SOLN
80.0000 mL | Freq: Once | INTRAMUSCULAR | Status: AC | PRN
  Administered 2024-10-04: 80 mL via INTRAVENOUS

## 2024-10-04 MED ORDER — CARBIDOPA-LEVODOPA 25-100 MG PO TABS
1.0000 | ORAL_TABLET | ORAL | Status: DC
  Administered 2024-10-04 – 2024-10-10 (×13): 1 via ORAL
  Filled 2024-10-04 (×13): qty 1

## 2024-10-04 MED ORDER — ADULT MULTIVITAMIN W/MINERALS CH
1.0000 | ORAL_TABLET | Freq: Every day | ORAL | Status: DC
  Administered 2024-10-04 – 2024-10-10 (×7): 1 via ORAL
  Filled 2024-10-04 (×7): qty 1

## 2024-10-04 MED ORDER — ONDANSETRON HCL 4 MG/2ML IJ SOLN
4.0000 mg | Freq: Four times a day (QID) | INTRAMUSCULAR | Status: DC | PRN
Start: 2024-10-04 — End: 2024-10-10

## 2024-10-04 MED ORDER — HYDROCODONE-ACETAMINOPHEN 5-325 MG PO TABS: 1.0000 | ORAL_TABLET | ORAL | Status: DC | PRN

## 2024-10-04 MED ORDER — POLYETHYLENE GLYCOL 3350 17 G PO PACK
17.0000 g | PACK | Freq: Every day | ORAL | Status: DC
  Administered 2024-10-04 – 2024-10-10 (×7): 17 g via ORAL
  Filled 2024-10-04 (×7): qty 1

## 2024-10-04 MED ORDER — ATORVASTATIN CALCIUM 20 MG PO TABS
40.0000 mg | ORAL_TABLET | Freq: Every day | ORAL | Status: DC
  Administered 2024-10-04 – 2024-10-09 (×6): 40 mg via ORAL
  Filled 2024-10-04 (×6): qty 2

## 2024-10-04 MED ORDER — SODIUM CHLORIDE 0.9 % IV SOLN
500.0000 mg | Freq: Two times a day (BID) | INTRAVENOUS | Status: DC
  Administered 2024-10-04 – 2024-10-07 (×7): 500 mg via INTRAVENOUS
  Filled 2024-10-04 (×6): qty 10
  Filled 2024-10-04: qty 500

## 2024-10-04 MED ORDER — SODIUM CHLORIDE 0.9 % IV SOLN
1.0000 g | Freq: Once | INTRAVENOUS | Status: AC
  Administered 2024-10-04: 1 g via INTRAVENOUS
  Filled 2024-10-04: qty 20

## 2024-10-04 MED ORDER — ACETAMINOPHEN 650 MG RE SUPP: 650.0000 mg | Freq: Four times a day (QID) | RECTAL | Status: DC | PRN

## 2024-10-04 MED ORDER — BISACODYL 10 MG RE SUPP
10.0000 mg | Freq: Every day | RECTAL | Status: DC | PRN
  Administered 2024-10-07: 10 mg via RECTAL
  Filled 2024-10-04: qty 1

## 2024-10-04 NOTE — TOC CM/SW Note (Signed)
 Transition of Care [facility]) CM/SW Note    Transition of Care [facility]) - Inpatient Brief Assessment   Patient Details  Name: Kimberly Scott MRN: [id number] Date of Birth: Mar 12, 1928  Transition [facility] Care [facility]) CM/SW Contact:    Alfonso Rummer, LCSW Phone Number: 10/04/2024, 4:12 PM   Clinical Narrative:  Kimberly Scott Rummer completed toc chart review No needs identified please contact should needs arise.   Transition of Care Asessment:

## 2024-10-04 NOTE — ED Notes (Signed)
 This tech assisted RN Comer change pt brief after removing bedpan. Pt has family sitting by bedside with call bell on side rail. Pt bed in the lowest position.

## 2024-10-04 NOTE — H&P (Signed)
 History and Physical    Patient: Kimberly Scott FMW:[id number] DOB: 1928/03/18 DOA: 10/03/2024 DOS: the patient was seen and examined on 10/04/2024 PCP: Fernande Ophelia [name], MD  Patient coming from: Home  Chief Complaint:   HPI: Kimberly Scott is a 89 y.o. female with history of Parkinson's, dementia, hypertension, GERD, ESBL UTI, presented initially with constipation as well as dysuria, lower abdominal burning. EMS had reported temperature 101F, glucose was normal. Daughter had noted that she was also a little bit confused today but no trauma or falls. Not on blood thinning medication. Her abdomen was soft, no tenderness.   UA is consistent with UTI with h/o ESBL UTI in the past.CT abdomen and pelvis showed inflammatory versus infectious proctitis.  Review of Systems:  General: Denies Fever  Chills Eyes: Denies  Eye Discharge , blurring of vision Ears/Nose/Mouth/Throat: Denies Sinus Problems  Dysphagia  Sore Throat   Cardiovascular: Denies Palpitations  Chest Pain/Pressure   Respiratory: Denies Cough  Hemoptysis  Shortness of Breath   Endocrine: Denies Cold/Hot Intolerance   Gastrointestinal: Denies Nausea  Vomiting  Has lower Abdominal Pain, Constipation Genitourinary: Has Dysuria  Musculoskeletal: Denies Joint Swelling, Muscle Pain/Soreness  Neurologic: Denies Light-Headedness, Headaches  Skin: Denies Rash , Easy Bruising  Psychiatric: Denies Depressive Symptoms   Past Medical History:  Diagnosis Date   Anginal pain    Arthritis    Cancer (HCC)    FACIAL SKIN CANCER   Coronary artery disease    Headache(784.0)    Heart murmur    Hypertension    Pneumonia    hx of PNA   Past Surgical History:  Procedure Laterality Date   ABDOMINAL HYSTERECTOMY     APPENDECTOMY     cataracts     CORONARY ARTERY BYPASS GRAFT  10/01/2012   Procedure: CORONARY ARTERY BYPASS GRAFTING (CABG);  Surgeon: Dallas KATHEE Jude, MD;  Location: [facility] OR;  Service: Open Heart Surgery;   Laterality: N/A;  times five using Left Internal Mammary Artery    ENDOVEIN HARVEST OF GREATER SAPHENOUS VEIN  10/01/2012   Procedure: ENDOVEIN HARVEST OF GREATER SAPHENOUS VEIN;  Surgeon: Dallas KATHEE Jude, MD;  Location: [facility] OR;  Service: Open Heart Surgery;  Laterality: Bilateral;   ESOPHAGOGASTRODUODENOSCOPY N/A 06/30/2021   Procedure: ESOPHAGOGASTRODUODENOSCOPY (EGD);  Surgeon: Toledo, Ladell POUR, MD;  Location: [facility] END[facility]COPY;  Service: Gastroenterology;  Laterality: N/A;   KNEE SURGERY     TONSILLECTOMY     TOTAL KNEE ARTHROPLASTY     right knee   Social History:  reports that she has never smoked. She has never used smokeless tobacco. She reports that she does not drink alcohol and does not use drugs.  Allergies  Allergen Reactions   Bacitracin-Neomycin-Polymyxin Swelling and Rash    Blisters Blisters    Ace Inhibitors     Other reaction(s): Cough   Omega 3  [Fish Oil] Other (See Comments)   Neosporin [Neomycin-Bacitracin Zn-Polymyx] Rash   Tape Rash    Blisters    No family history on file.  Prior to Admission medications   Medication Sig Start Date End Date Taking? Authorizing Provider  acetaminophen  (TYLENOL ) 325 MG tablet Take 2 tablets (650 mg total) by mouth every 6 (six) hours as needed for mild pain, headache or fever. 12/11/21   Fausto Burnard LABOR, DO  acidophilus (RISAQUAD) CAPS capsule Take 1 capsule by mouth daily.    [provider]  ascorbic acid  (VITAMIN C ) 500 MG tablet Take 2 tablets (1,000 mg total) by  mouth daily. 12/11/21   Fausto Burnard LABOR, DO  aspirin  EC 81 MG tablet Take 81 mg by mouth daily.    [provider]  atorvastatin  (LIPITOR) 40 MG tablet Take 1 tablet (40 mg total) by mouth daily at 6 PM. 05/23/18   Josette Ade, MD  carbidopa -levodopa  (SINEMET  IR) 25-100 MG tablet Take 2 tablets by mouth as directed. Take 2 tabs in the morning and 1 tablet at noon and dinner time    [provider]  Cholecalciferol  125 MCG (5000 UT)  capsule Take 5,000 Units by mouth daily.    [provider]  Cranberry 4200 MG CAPS Take 1 capsule by mouth daily.    [provider]  cyanocobalamin  (,VITAMIN B-12,) 1000 MCG/ML injection Inject 1,000 mcg into the muscle every 30 (thirty) days. Last dose 11/20/21 11/21/21   [provider]  diclofenac  Sodium (VOLTAREN ) 1 % GEL Apply 2 g topically 4 (four) times daily. 12/11/21   Fausto Burnard A, DO  estradiol  (ESTRACE ) 0.1 MG/GM vaginal cream Place 1 Applicatorful vaginally 3 (three) times a week.    [provider]  feeding supplement (ENSURE ENLIVE / ENSURE PLUS) LIQD Take 237 mLs by mouth 3 (three) times daily between meals. 12/11/21   Fausto Burnard LABOR, DO  furosemide  (LASIX ) 20 MG tablet Take 20 mg by mouth daily.    [provider]  losartan (COZAAR) 25 MG tablet Take 25 mg by mouth daily.    [provider]  Multiple Vitamin (MULTIVITAMIN WITH MINERALS) TABS tablet Take 1 tablet by mouth daily. 12/12/21   Fausto Burnard LABOR, DO  polyethylene glycol (MIRALAX  / GLYCOLAX ) 17 g packet Take 17 g by mouth daily as needed for mild constipation. 03/14/24   Laurita Pillion, MD  potassium chloride  SA (KLOR-CON  M) 20 MEQ tablet Take 20 mEq by mouth daily.    [provider]  triamcinolone ointment (KENALOG) 0.1 % Apply 1 application topically 2 (two) times daily as needed.    [provider]    Physical Exam: Vitals:   10/04/24 0157 10/04/24 0200 10/04/24 0634 10/04/24 0728  BP:  (!) 168/57 (!) 152/59 (!) 129/53  Pulse:  80 65 63  Resp:  15 18 16   Temp: 97.9 F (36.6 C) 98.4 F (36.9 C) 97.6 F (36.4 C) 97.6 F (36.4 C)  TempSrc: Oral Oral Oral Oral  SpO2:  98% 99% 99%  Weight:      Height:       General:  NAD/NARD. Alert & Oriented x 3. Cooperative.   Eyes: PERRLA. EOMI.   ENMT:   No deformities.  MMM.   Head: Normocephalic/Atraumatic.   Neck: Supple. No cervical LAD.  No JVD.  Respiratory:  No wheezing/rales/rhonchi  appreciated.  Normal respiratory rate and rhythm.  Cardiovascular: Heart: RRR. Normal S1/S2. No murmurs/gallops/rubs. No LE edema.   Abdomen:  Soft. Mildly tender in lower abdomen. ND. Normoactive BS present. No rebound tenderness, guarding, fluid or masses/organomegaly present.   Genitourinary: Genital exam deferred.  Musculoskeletal:  No deformities, induration, or joint effusion. Non-tender to palpation.   Skin: No rashes, nodules, or evidence of bleeding/bruising. Neurologic: Grossly intact. Normal tone and bulk. Sensation intact.  Psychologic: Normal affect/mood.   Data Reviewed:   Latest Reference Range & Units 10/03/24 19:51  Sodium 135 - 145 mmol/L 137  Potassium 3.5 - 5.1 mmol/L 4.1  Chloride 98 - 111 mmol/L 104  CO2 22 - 32 mmol/L 21 (L)  Glucose 70 - 99 mg/dL 898 (H)  BUN 8 - 23 mg/dL 29 (H)  Creatinine 9.55 - 1.00 mg/dL 8.70 (H)  Calcium  8.9 - 10.3 mg/dL 9.3  Anion gap 5 - 15  12  Alkaline Phosphatase 38 - 126 U/L 52  Albumin  3.5 - 5.0 g/dL 3.9  Lipase 11 - 51 U/L 29  AST 15 - 41 U/L 32  ALT 0 - 44 U/L 6  Total Protein 6.5 - 8.1 g/dL 7.5  Total Bilirubin 0.0 - 1.2 mg/dL 1.2  GFR, Estimated >39 mL/min 38 (L)  WBC 4.0 - 10.5 K/uL 14.3 (H)  RBC 3.87 - 5.11 MIL/uL 3.52 (L)  Hemoglobin 12.0 - 15.0 g/dL 88.9 (L)  HCT 63.9 - 53.9 % 34.3 (L)  MCV 80.0 - 100.0 fL 97.4  MCH 26.0 - 34.0 pg 31.3  MCHC 30.0 - 36.0 g/dL 67.8  RDW 88.4 - 84.4 % 13.5  Platelets 150 - 400 K/uL 233  nRBC 0.0 - 0.2 % 0.0  (L): Data is abnormally low (H): Data is abnormally high  Assessment and Plan: Acute proctitis likely secondary to fecal impaction: Started on IV meropenem  Give continuous IV fluids Clear liquid diet for now  Constipation: Give MiraLAX  once daily, Dulcolax suppository daily as needed for severe constipation  UTI: Noted abnormal UA, Follow-up urine culture Give IV meropenem  given history of ESBL UTI in the past Contact precautions  Mild acute renal insufficiency:  Start on continuous IV fluids Avoid nephrotoxic agents Follow-up creatinine in a.m.  History of parkinsonism: Resume Sinemet  at home dose  History of CAD: No active chest symptoms Resume aspirin  statin  DVT prophylaxis with Lovenox   Advance Care Planning: Based on patient's daughter at bedside, patient is DNR B CODE STATUS-this has been patient's wish  Family Communication: Updated patient's daughter at bedside about plans of care  Severity of Illness: Inpatient  Author: Cheyne Bungert A Jacobo Moncrief, MD 10/04/2024 9:15 AM  For on call review [email or web address].

## 2024-10-04 NOTE — Progress Notes (Signed)
 Anticoagulation monitoring(Lovenox ):  89 yo female ordered Lovenox  40 mg Q24h    Filed Weights   10/03/24 1936  Weight: 62.6 kg (138 lb)   BMI 25   Lab Results  Component Value Date   CREATININE 1.29 (H) 10/03/2024   CREATININE 1.16 (H) 03/14/2024   CREATININE 1.09 (H) 03/13/2024   Estimated Creatinine Clearance: 23.2 mL/min (A) (by C-G formula based on SCr of 1.29 mg/dL (H)). Hemoglobin & Hematocrit     Component Value Date/Time   HGB 11.0 (L) 10/03/2024 1951   HGB 11.2 (L) 06/04/2013 0910   HCT 34.3 (L) 10/03/2024 1951   HCT 33.0 (L) 06/05/2013 9350     Per Protocol for Patient with estCrcl < 30 ml/min and BMI < 30, will transition to Lovenox  30 mg Q24h.

## 2024-10-04 NOTE — ED Notes (Signed)
 Changed pt brief and bed sheets at this time. Assisted RN Comer with getting pt on the bedpan as pt stated she needed to have a bowel movement.

## 2024-10-04 NOTE — Plan of Care (Signed)
  Problem: Education: Goal: Knowledge of General Education information will improve Description: Including pain rating scale, medication(s)/side effects and non-pharmacologic comfort measures Outcome: Progressing   Problem: Clinical Measurements: Goal: Ability to maintain clinical measurements within normal limits will improve Outcome: Progressing   Problem: Clinical Measurements: Goal: Diagnostic test results will improve Outcome: Progressing   

## 2024-10-04 NOTE — Plan of Care (Signed)

## 2024-10-04 NOTE — Progress Notes (Signed)
 ED Pharmacy Antibiotic Sign Off An antibiotic consult was received from an ED provider for meropenem  per pharmacy dosing for UTI, hx of ESBL. A chart review was completed to assess appropriateness.   The following one time order(s) were placed:  Meropenem  1 gm IV X 1.   Further antibiotic and/or antibiotic pharmacy consults should be ordered by the admitting provider if indicated.   Thank you for allowing pharmacy to be a part of this patient's care.   Silver Selinda BIRCH, [facility]  Clinical Pharmacist 10/04/24 1:16 AM

## 2024-10-05 DIAGNOSIS — K6289 Other specified diseases of anus and rectum: Secondary | ICD-10-CM | POA: Diagnosis not present

## 2024-10-05 LAB — BASIC METABOLIC PANEL WITH GFR
Anion gap: 9 (ref 5–15)
BUN: 19 mg/dL (ref 8–23)
CO2: 22 mmol/L (ref 22–32)
Calcium: 8.5 mg/dL — ABNORMAL LOW (ref 8.9–10.3)
Chloride: 110 mmol/L (ref 98–111)
Creatinine, Ser: 1.13 mg/dL — ABNORMAL HIGH (ref 0.44–1.00)
GFR, Estimated: 45 mL/min — ABNORMAL LOW (ref >60)
Glucose, Bld: 88 mg/dL (ref 70–99)
Potassium: 4.4 mmol/L (ref 3.5–5.1)
Sodium: 141 mmol/L (ref 135–145)

## 2024-10-05 LAB — CBC
HCT: 30.6 % — ABNORMAL LOW (ref 36.0–46.0)
Hemoglobin: 9.9 g/dL — ABNORMAL LOW (ref 12.0–15.0)
MCH: 31.2 pg (ref 26.0–34.0)
MCHC: 32.4 g/dL (ref 30.0–36.0)
MCV: 96.5 fL (ref 80.0–100.0)
Platelets: 183 K/uL (ref 150–400)
RBC: 3.17 MIL/uL — ABNORMAL LOW (ref 3.87–5.11)
RDW: 13.6 % (ref 11.5–15.5)
WBC: 4.9 K/uL (ref 4.0–10.5)
nRBC: 0 % (ref 0.0–0.2)

## 2024-10-05 NOTE — Progress Notes (Signed)
 Progress Note   Patient: Kimberly Scott FMW:[id number] DOB: October 11, 1928 DOA: 10/03/2024     1 DOS: the patient was seen and examined on 10/05/2024   Brief hospital course: Patient has been admitted for acute proctitis secondary to fecal impaction and also noted to have UTI.  Assessment and Plan: Acute proctitis likely secondary to fecal impaction: Continue IV meropenem  Give continuous IV fluids Change clear liquids to GI soft diet   Constipation: Give MiraLAX  once daily, Dulcolax suppository daily as needed for severe constipation   UTI: Noted abnormal UA, Follow-up urine culture Continue IV meropenem  given history of ESBL UTI in the past Contact precautions   Mild acute renal insufficiency: Improving to baseline creatinine  Stop continuous IV fluids Avoid nephrotoxic agents Follow-up creatinine in a.m.   History of parkinsonism: Continue Sinemet  at home dose   History of CAD: No active chest symptoms Continue aspirin  statin   DVT prophylaxis with Lovenox    Advance Care Planning: Based on patient's daughter at bedside, patient is DNR B CODE STATUS-this has been patient's wish   Family Communication: Updated patient's daughter over the phone this afternoon about plans of care   Severity of Illness: Inpatient       Subjective: Patient denies any abdominal pain States that she passed 2 bowel movements yesterday evening Denies fever, chills, shortness of breath, chest pain, nausea, vomiting, dizziness, headache.  Physical Exam: Vitals:   10/04/24 2100 10/05/24 0043 10/05/24 0401 10/05/24 0843  BP: (!) (P) 123/54 (!) 120/54 (!) 148/61 (!) 141/54  Pulse: (!) (P) 53 62 (!) 59 (!) 48  Resp:   20 16  Temp:   97.8 F (36.6 C) (!) 97.5 F (36.4 C)  TempSrc:   Oral Oral  SpO2:   95% 97%  Weight:      Height:       General:  NAD/NARD. Alert & Oriented x 2-3. Cooperative.   Eyes: PERRLA. EOMI.   ENMT:   No deformities.  MMM.   Head: Normocephalic/Atraumatic.    Neck: Supple. No cervical LAD.  No JVD.  Respiratory:  No wheezing/rales/rhonchi appreciated.  Normal respiratory rate and rhythm.  Cardiovascular: Heart: RRR. Normal S1/S2. No murmurs/gallops/rubs. No LE edema.   Abdomen:  Soft.  Nontender. ND. Normoactive BS present. No rebound tenderness, guarding, fluid or masses/organomegaly present.   Genitourinary: Genital exam deferred.  Musculoskeletal:  No deformities, induration, or joint effusion. Non-tender to palpation.   Skin: No rashes, nodules, or evidence of bleeding/bruising. Neurologic: Grossly intact. Normal tone and bulk. Sensation intact.  Psychologic: Normal affect/mood.    Data Reviewed:  Latest Reference Range & Units 10/05/24 05:50  Sodium 135 - 145 mmol/L 141  Potassium 3.5 - 5.1 mmol/L 4.4  Chloride 98 - 111 mmol/L 110  CO2 22 - 32 mmol/L 22  Glucose 70 - 99 mg/dL 88  BUN 8 - 23 mg/dL 19  Creatinine 9.55 - 8.99 mg/dL 8.86 (H)  Calcium  8.9 - 10.3 mg/dL 8.5 (L)  Anion gap 5 - 15  9  GFR, Estimated >60 mL/min 45 (L)  WBC 4.0 - 10.5 K/uL 4.9  RBC 3.87 - 5.11 MIL/uL 3.17 (L)  Hemoglobin 12.0 - 15.0 g/dL 9.9 (L)  HCT 63.9 - 53.9 % 30.6 (L)  MCV 80.0 - 100.0 fL 96.5  MCH 26.0 - 34.0 pg 31.2  MCHC 30.0 - 36.0 g/dL 67.5  RDW 88.4 - 84.4 % 13.6  Platelets 150 - 400 K/uL 183  nRBC 0.0 - 0.2 % 0.0  (H): Data  is abnormally high (L): Data is abnormally low  Disposition: Status is: Inpatient   Planned Discharge Destination: Home    Time spent: 35 minutes  Author: Anis Cinelli A Ezekiel Menzer, MD 10/05/2024 10:39 AM  For on call review [email or web address].

## 2024-10-05 NOTE — Plan of Care (Signed)

## 2024-10-06 DIAGNOSIS — K6289 Other specified diseases of anus and rectum: Secondary | ICD-10-CM | POA: Diagnosis not present

## 2024-10-06 LAB — CBC
HCT: 34.1 % — ABNORMAL LOW (ref 36.0–46.0)
Hemoglobin: 11 g/dL — ABNORMAL LOW (ref 12.0–15.0)
MCH: 31.5 pg (ref 26.0–34.0)
MCHC: 32.3 g/dL (ref 30.0–36.0)
MCV: 97.7 fL (ref 80.0–100.0)
Platelets: 208 10*3/uL (ref 150–400)
RBC: 3.49 MIL/uL — ABNORMAL LOW (ref 3.87–5.11)
RDW: 13.8 % (ref 11.5–15.5)
WBC: 5.2 10*3/uL (ref 4.0–10.5)
nRBC: 0 % (ref 0.0–0.2)

## 2024-10-06 LAB — BASIC METABOLIC PANEL WITH GFR
Anion gap: 6 (ref 5–15)
BUN: 20 mg/dL (ref 8–23)
CO2: 25 mmol/L (ref 22–32)
Calcium: 8.5 mg/dL — ABNORMAL LOW (ref 8.9–10.3)
Chloride: 109 mmol/L (ref 98–111)
Creatinine, Ser: 1.12 mg/dL — ABNORMAL HIGH (ref 0.44–1.00)
GFR, Estimated: 46 mL/min — ABNORMAL LOW (ref >60)
Glucose, Bld: 101 mg/dL — ABNORMAL HIGH (ref 70–99)
Potassium: 4.7 mmol/L (ref 3.5–5.1)
Sodium: 140 mmol/L (ref 135–145)

## 2024-10-06 MED ORDER — SENNOSIDES-DOCUSATE SODIUM 8.6-50 MG PO TABS
2.0000 | ORAL_TABLET | Freq: Every day | ORAL | Status: DC
  Administered 2024-10-06 – 2024-10-09 (×4): 2 via ORAL
  Filled 2024-10-06 (×4): qty 2

## 2024-10-06 MED ORDER — HYDRALAZINE HCL 20 MG/ML IJ SOLN
10.0000 mg | Freq: Four times a day (QID) | INTRAMUSCULAR | Status: DC | PRN
  Administered 2024-10-07 (×2): 10 mg via INTRAVENOUS
  Filled 2024-10-06 (×2): qty 1

## 2024-10-06 MED ORDER — SODIUM CHLORIDE 0.9 % IV SOLN: INTRAVENOUS | Status: AC

## 2024-10-06 MED ORDER — LOSARTAN POTASSIUM 25 MG PO TABS
25.0000 mg | ORAL_TABLET | Freq: Every day | ORAL | Status: DC
  Administered 2024-10-06 – 2024-10-10 (×5): 25 mg via ORAL
  Filled 2024-10-06 (×5): qty 1

## 2024-10-06 MED ORDER — BISACODYL 5 MG PO TBEC
5.0000 mg | DELAYED_RELEASE_TABLET | Freq: Every day | ORAL | Status: DC | PRN
  Administered 2024-10-06: 5 mg via ORAL
  Filled 2024-10-06: qty 1

## 2024-10-06 MED ORDER — MINERAL OIL RE ENEM
1.0000 | ENEMA | Freq: Once | RECTAL | Status: AC
  Administered 2024-10-06: 1 via RECTAL

## 2024-10-06 NOTE — Plan of Care (Signed)

## 2024-10-06 NOTE — Care Management Important Message (Signed)
 Important Message  Patient Details  Name: Kimberly Scott MRN: [id number] Date of Birth: July 28, 1928   Important Message Given:  Yes - Medicare IM     Rojelio SHAUNNA Rattler 10/06/2024, 5:54 PM

## 2024-10-06 NOTE — Progress Notes (Signed)
 PROGRESS NOTE    Kimberly Scott  FMW:[id number] DOB: January 16, 1928 DOA: 10/03/2024 PCP: Fernande Ophelia [name], MD    Brief Narrative:  89 year old female with PMHx of Parkinson disease, dementia, HTN, GERD, and prior ESBL UTI presenting from home on 10/03/2024 with constipation, dysuria, and lower abdominal pain.  EMS noted a temp of 101 F.  Per daughter, the patient was mildly confused but had no recent falls or trauma.  On exam, abdomen was soft and nontender.  UA was consistent with UTI with prior cultures growing ESBL organisms.  CT abdomen/pelvis demonstrated marked rectal wall thickening with mucosal hyperenhancement and adjacent fat stranding, suggestive inflammatory versus infectious proctitis, along with moderate colonic stool burden without obstruction.  Assessment and Plan:  # Proctitis - EMS reported patient febrile to 101 F. Leukocytosis to 14.3 on admission - CT abdomen/pelvis showed marked rectal wall thickening with mucosal hyperenhancement and surrounding fat stranding, no abscess - Possible infectious or inflammatory etiology - Afebrile, leukocytosis resolved - Continue IV meropenem  - Bowel regimen for associated constipation as below  #Constipation - Moderate colonic stool burden on CT - Had small hard pellet stools during the day. Gave 1 mineral oil enema. Had BM following that with softer stools and symptomatic improvement - Continue bowel regimen with daily MiraLAX , Sennakot-S 2 tablets nightly, and PRN dulcolax daily - NS at 75 mL/hr   # UTI - Has history of UTI with ESLB organisms - EMS reported patient febrile to 101 F.  Leukocytosis 14.3 on admission.  - On arrival, patient somewhat confused per daughter's report - UA concerning for UTI - Urine culture pending - Continue IV meropenem    # Parkinson's disease # Dementia - Baseline mild cognitive impairment with acute worsening likely due to infection - Continue home Sinemet  - Delirium precautions   #  Sinus bradycardia - Appears chronic - Asymptomatic - Continue cardiac monitoring  # Hypertension - Continue home Losartan  # Debility - PT/OT consulted  DVT prophylaxis: enoxaparin  (LOVENOX ) injection 30 mg Start: 10/04/24 0800   Code Status:   Code Status: Do not attempt resuscitation (DNR) PRE-ARREST INTERVENTIONS DESIRED  Family Communication: Discussed with patient's daughter, Ms. Donny, at bedside  Disposition Plan: From home, pending PT/OT evaluation and clinical improvement PT Follow up Recs:   Level of care: Med-Surg  Consultants:  None  Procedures:  None  Antimicrobials: IV Meropenem     Subjective: Patient examined at bedside. Overall, she is doing well. Tolerating PO intake without N/V. Has no   Objective: Vitals:   10/05/24 1840 10/05/24 2026 10/06/24 0421 10/06/24 0916  BP: 121/85 (!) 152/60 123/75 (!) 169/59  Pulse: 63 63 (!) 53 (!) 55  Resp: 16 18 18 16   Temp: 97.9 F (36.6 C) 97.7 F (36.5 C) 97.6 F (36.4 C) 98 F (36.7 C)  TempSrc: Oral   Oral  SpO2: 98% 97% 97% 95%  Weight:      Height:        Intake/Output Summary (Last 24 hours) at 10/06/2024 1106 Last data filed at 10/05/2024 1448 Gross per 24 hour  Intake 400 ml  Output --  Net 400 ml   Filed Weights   10/03/24 1936  Weight: 62.6 kg    Examination:  General exam: Comfortable, in no acute distress Respiratory system: CTAB, normal respiratory effort Cardiovascular system: + S1/S2, no murmurs appreciate. No pedal edema. Gastrointestinal system: Soft, NTND, no guarding or peritonitis. Bowel sounds positive in 4 quadrants.  Central nervous system: Alert and oriented x2.  No focal neurological deficits. Extremities: Strength 5x5 symmetically Skin: No rashes, lesions or ulcers Psychiatry: Judgement and insight appear limited. Mood & affect appropriate.     Data Reviewed: I have personally reviewed following labs and imaging studies  CBC: Recent Labs  Lab 10/03/24 1951  10/05/24 0550  WBC 14.3* 4.9  HGB 11.0* 9.9*  HCT 34.3* 30.6*  MCV 97.4 96.5  PLT 233 183   Basic Metabolic Panel: Recent Labs  Lab 10/03/24 1951 10/05/24 0550 10/06/24 0514  NA 137 141 140  K 4.1 4.4 4.7  CL 104 110 109  CO2 21* 22 25  GLUCOSE 101* 88 101*  BUN 29* 19 20  CREATININE 1.29* 1.13* 1.12*  CALCIUM  9.3 8.5* 8.5*   GFR: Estimated Creatinine Clearance: 26.7 mL/min (A) (by C-G formula based on SCr of 1.12 mg/dL (H)). Liver Function Tests: Recent Labs  Lab 10/03/24 1951  AST 32  ALT 6  ALKPHOS 52  BILITOT 1.2  PROT 7.5  ALBUMIN  3.9   Recent Labs  Lab 10/03/24 1951  LIPASE 29   No results for input(s): AMMONIA in the last 168 hours. Coagulation Profile: No results for input(s): INR, PROTIME in the last 168 hours. Cardiac Enzymes: No results for input(s): CKTOTAL, CKMB, CKMBINDEX, TROPONINI in the last 168 hours. BNP (last 3 results) No results for input(s): PROBNP in the last 8760 hours. HbA1C: No results for input(s): HGBA1C in the last 72 hours. CBG: No results for input(s): GLUCAP in the last 168 hours. Lipid Profile: No results for input(s): CHOL, HDL, LDLCALC, TRIG, CHOLHDL, LDLDIRECT in the last 72 hours. Thyroid Function Tests: No results for input(s): TSH, T4TOTAL, FREET4, T3FREE, THYROIDAB in the last 72 hours. Anemia Panel: No results for input(s): VITAMINB12, FOLATE, FERRITIN, TIBC, IRON, RETICCTPCT in the last 72 hours. Sepsis Labs: No results for input(s): PROCALCITON, LATICACIDVEN in the last 168 hours.  No results found for this or any previous visit (from the past 240 hours).   Radiology Studies: No results found.  Scheduled Meds:  aspirin  EC  81 mg Oral Daily   atorvastatin   40 mg Oral q1800   carbidopa -levodopa   1 tablet Oral 2 times per day   carbidopa -levodopa   2 tablet Oral Q0600   enoxaparin  (LOVENOX ) injection  30 mg Subcutaneous Q24H   multivitamin with  minerals  1 tablet Oral Daily   polyethylene glycol  17 g Oral Daily   Continuous Infusions:  meropenem  (MERREM ) IV 500 mg (10/06/24 0203)     LOS:  LOS: 2 days   Time Spent: 40 minutes  Unresulted Labs (From admission, onward)     Start     Ordered   10/11/24 0500  Creatinine, serum  (enoxaparin  (LOVENOX )    CrCl >/= 30 ml/min)  Weekly,   STAT     Comments: while on enoxaparin  therapy    10/04/24 0512   10/07/24 0500  Basic metabolic panel with GFR  Daily,   R     Question:  Specimen collection method  Answer:  Lab=Lab collect   10/06/24 1105   10/06/24 1106  CBC  Daily,   R     Question:  Specimen collection method  Answer:  Lab=Lab collect   10/06/24 1105   10/05/24 1448  Urine Culture (for pregnant, neutropenic or urologic patients or patients with an indwelling urinary catheter)  (Urine Labs)  Once,   R       Question:  Indication  Answer:  Dysuria   10/05/24 1447  Kholton Coate Al-Sultani, MD [facility]  If 7PM-7AM, please contact night-coverage  10/06/2024, 11:06 AM

## 2024-10-06 NOTE — Progress Notes (Signed)
 Mobility Specialist - Progress Note   10/06/24 1529  Mobility  Activity Ambulated with assistance  Level of Assistance Standby assist, set-up cues, supervision of patient - no hands on  Assistive Device BSC  Distance Ambulated (ft) 4 ft  Activity Response Tolerated well  Mobility visit 1 Mobility     Pt transferred bed-BSC with minG. Does rely on furniture for increased stability. VC for safe transfer as pt does not pay much attention to lines/leads. Very small BM---dime sized amount. Able to perform peri-care with supervision. Pt returned to bed with alarm set, needs in reach. In chair position to finish lunch.    Lennette Seip Mobility Specialist 10/06/24, 3:31 PM

## 2024-10-07 DIAGNOSIS — K6289 Other specified diseases of anus and rectum: Secondary | ICD-10-CM | POA: Diagnosis not present

## 2024-10-07 LAB — BASIC METABOLIC PANEL WITH GFR
Anion gap: 9 (ref 5–15)
BUN: 28 mg/dL — ABNORMAL HIGH (ref 8–23)
CO2: 24 mmol/L (ref 22–32)
Calcium: 8.5 mg/dL — ABNORMAL LOW (ref 8.9–10.3)
Chloride: 105 mmol/L (ref 98–111)
Creatinine, Ser: 1.08 mg/dL — ABNORMAL HIGH (ref 0.44–1.00)
GFR, Estimated: 48 mL/min — ABNORMAL LOW (ref >60)
Glucose, Bld: 104 mg/dL — ABNORMAL HIGH (ref 70–99)
Potassium: 4.5 mmol/L (ref 3.5–5.1)
Sodium: 138 mmol/L (ref 135–145)

## 2024-10-07 LAB — CBC
HCT: 32.9 % — ABNORMAL LOW (ref 36.0–46.0)
Hemoglobin: 10.5 g/dL — ABNORMAL LOW (ref 12.0–15.0)
MCH: 30.7 pg (ref 26.0–34.0)
MCHC: 31.9 g/dL (ref 30.0–36.0)
MCV: 96.2 fL (ref 80.0–100.0)
Platelets: 212 K/uL (ref 150–400)
RBC: 3.42 MIL/uL — ABNORMAL LOW (ref 3.87–5.11)
RDW: 13.4 % (ref 11.5–15.5)
WBC: 5.8 K/uL (ref 4.0–10.5)
nRBC: 0 % (ref 0.0–0.2)

## 2024-10-07 LAB — URINE CULTURE: Culture: NO GROWTH

## 2024-10-07 NOTE — Progress Notes (Signed)
 PROGRESS NOTE    Kimberly Scott  FMW:[id number] DOB: 1928-06-13 DOA: 10/03/2024 PCP: Fernande Ophelia [name], MD    Brief Narrative:  89 year old female with PMHx of Parkinson disease, dementia, HTN, GERD, and prior ESBL UTI presenting from home on 10/03/2024 with constipation, dysuria, and lower abdominal pain.  EMS noted a temp of 101 F.  Per daughter, the patient was mildly confused but had no recent falls or trauma.  On exam, abdomen was soft and nontender.  UA was consistent with UTI with prior cultures growing ESBL organisms.  CT abdomen/pelvis demonstrated marked rectal wall thickening with mucosal hyperenhancement and adjacent fat stranding, suggestive inflammatory versus infectious proctitis, along with moderate colonic stool burden without obstruction.  Assessment and Plan:  # Proctitis - EMS reported patient febrile to 101 F. Leukocytosis to 14.3 on admission - CT abdomen/pelvis showed marked rectal wall thickening with mucosal hyperenhancement and surrounding fat stranding, no abscess - Possible infectious or inflammatory etiology - Afebrile, leukocytosis resolved - Discontinued meropenem  - Bowel regimen for associated constipation as below  #Constipation - Moderate colonic stool burden on CT - s/p mineral enema yesterday - Having multiple small to medium soft stools, unclear if she passed the bulk of her stool burden - Avoiding digital disimpaction given proctitis  - Continue bowel regimen with daily MiraLAX , Sennakot-S 2 tablets nightly, and PRN dulcolax daily - NS at 75 mL/hr   # UTI - Has history of UTI with ESLB organisms - EMS reported patient febrile to 101 F.  Leukocytosis 14.3 on admission.  - On arrival, patient somewhat confused per daughter's report.  Now back to baseline. - UA concerning for UTI - Urine culture showed no growth.  However sample was collected after patient received multiple doses of meropenem  - Completed 4-day course of IV meropenem   #  Parkinson's disease # Dementia - Baseline mild cognitive impairment with acute worsening likely due to infection - Continue home Sinemet  - Delirium precautions   # Sinus bradycardia - Appears chronic - Asymptomatic - EKG today showed normal sinus rhythm with RBBB  # Hypertension - Continue home Losartan  # Debility - PT/OT consulted - recommending SNF  DVT prophylaxis: enoxaparin  (LOVENOX ) injection 30 mg Start: 10/04/24 0800   Code Status:   Code Status: Do not attempt resuscitation (DNR) PRE-ARREST INTERVENTIONS DESIRED  Family Communication: Discussed with patient's daughter, Ms. Donny, at bedside  Disposition Plan: From home, pending PT/OT evaluation and clinical improvement PT Follow up Recs: Skilled Nursing-Short Term Rehab (<3 Hours/Day)10/07/2024 1111  Level of care: Med-Surg  Consultants:  None  Procedures:  None  Antimicrobials: IV Meropenem  stopped 11/20   Subjective: Patient examined at bedside.  Reports doing fine this morning, was having a hard time reaching her daughter.  Symptomatically, she reports feeling better.  Has had multiple small but soft bowel movements.  Denies any dysuria, fevers, chills, nausea, vomiting.  Objective: Vitals:   10/06/24 2035 10/07/24 0429 10/07/24 0541 10/07/24 0803  BP: (!) 199/50 (!) 188/68 (!) 130/44 (!) 132/52  Pulse: (!) 57 (!) 53 65 63  Resp:  14  19  Temp:  97.6 F (36.4 C)  97.7 F (36.5 C)  TempSrc:    Oral  SpO2:  92%  93%  Weight:      Height:        Intake/Output Summary (Last 24 hours) at 10/07/2024 1055 Last data filed at 10/07/2024 0900 Gross per 24 hour  Intake 625.48 ml  Output --  Net 625.48 ml  Filed Weights   10/03/24 1936  Weight: 62.6 kg    Examination:  General exam: Comfortable, in no acute distress Respiratory system: CTAB, normal respiratory effort Cardiovascular system: + S1/S2, no murmurs appreciate. No pedal edema. Gastrointestinal system: Soft, NTND, no guarding or  peritonitis. Bowel sounds positive in 4 quadrants.  Central nervous system: Alert and oriented x2. No focal neurological deficits. Extremities: Strength 5x5 symmetically Skin: No rashes, lesions or ulcers Psychiatry: Judgement and insight appear limited. Mood & affect appropriate.     Data Reviewed: I have personally reviewed following labs and imaging studies  CBC: Recent Labs  Lab 10/03/24 1951 10/05/24 0550 10/06/24 1415 10/07/24 0619  WBC 14.3* 4.9 5.2 5.8  HGB 11.0* 9.9* 11.0* 10.5*  HCT 34.3* 30.6* 34.1* 32.9*  MCV 97.4 96.5 97.7 96.2  PLT 233 183 208 212   Basic Metabolic Panel: Recent Labs  Lab 10/03/24 1951 10/05/24 0550 10/06/24 0514 10/07/24 0619  NA 137 141 140 138  K 4.1 4.4 4.7 4.5  CL 104 110 109 105  CO2 21* 22 25 24   GLUCOSE 101* 88 101* 104*  BUN 29* 19 20 28*  CREATININE 1.29* 1.13* 1.12* 1.08*  CALCIUM  9.3 8.5* 8.5* 8.5*   GFR: Estimated Creatinine Clearance: 27.7 mL/min (A) (by C-G formula based on SCr of 1.08 mg/dL (H)). Liver Function Tests: Recent Labs  Lab 10/03/24 1951  AST 32  ALT 6  ALKPHOS 52  BILITOT 1.2  PROT 7.5  ALBUMIN  3.9   Recent Labs  Lab 10/03/24 1951  LIPASE 29   No results for input(s): AMMONIA in the last 168 hours. Coagulation Profile: No results for input(s): INR, PROTIME in the last 168 hours. Cardiac Enzymes: No results for input(s): CKTOTAL, CKMB, CKMBINDEX, TROPONINI in the last 168 hours. BNP (last 3 results) No results for input(s): PROBNP in the last 8760 hours. HbA1C: No results for input(s): HGBA1C in the last 72 hours. CBG: No results for input(s): GLUCAP in the last 168 hours. Lipid Profile: No results for input(s): CHOL, HDL, LDLCALC, TRIG, CHOLHDL, LDLDIRECT in the last 72 hours. Thyroid Function Tests: No results for input(s): TSH, T4TOTAL, FREET4, T3FREE, THYROIDAB in the last 72 hours. Anemia Panel: No results for input(s): VITAMINB12,  FOLATE, FERRITIN, TIBC, IRON, RETICCTPCT in the last 72 hours. Sepsis Labs: No results for input(s): PROCALCITON, LATICACIDVEN in the last 168 hours.  Recent Results (from the past 240 hours)  Urine Culture (for pregnant, neutropenic or urologic patients or patients with an indwelling urinary catheter)     Status: None   Collection Time: 10/05/24 10:23 PM   Specimen: Urine, Clean Catch  Result Value Ref Range Status   Specimen Description   Final    URINE, CLEAN CATCH Performed at [facility], [address] [place], [place] [address]    Special Requests   Final    NONE Performed at [facility], [address] [place], [place] [address]    Culture   Final    NO GROWTH Performed at [facility], [address] [place], [place] [address]    Report Status 10/07/2024 FINAL  Final     Radiology Studies: No results found.  Scheduled Meds:  aspirin  EC  81 mg Oral Daily   atorvastatin   40 mg Oral q1800   carbidopa -levodopa   1 tablet Oral 2 times per day   carbidopa -levodopa   2 tablet Oral Q0600   enoxaparin  (LOVENOX ) injection  30 mg Subcutaneous Q24H   losartan  25 mg Oral Daily   multivitamin with minerals  1 tablet Oral Daily   polyethylene glycol  17 g Oral Daily   senna-docusate  2 tablet Oral QHS   Continuous Infusions:  sodium chloride  75 mL/hr at 10/07/24 0843   meropenem  (MERREM ) IV 500 mg (10/07/24 0117)     LOS:  LOS: 3 days   Time Spent: 40 minutes  Unresulted Labs (From admission, onward)     Start     Ordered   10/11/24 0500  Creatinine, serum  (enoxaparin  (LOVENOX )    CrCl >/= 30 ml/min)  Weekly,   STAT     Comments: while on enoxaparin  therapy    10/04/24 0512   10/07/24 0500  Basic metabolic panel with GFR  Daily,   R     Question:  Specimen collection method  Answer:  Lab=Lab collect   10/06/24 1105   10/06/24 1106  CBC  Daily,   R     Question:  Specimen collection method  Answer:  Lab=Lab collect    10/06/24 1105             Riese Hellard Al-Sultani, MD [facility]  If 7PM-7AM, please contact night-coverage  10/07/2024, 10:55 AM

## 2024-10-07 NOTE — Progress Notes (Signed)
 Mobility Specialist Progress Note:    10/07/24 1612  Mobility  Activity Ambulated with assistance  Level of Assistance Contact guard assist, steadying assist  Assistive Device Front wheel walker  Distance Ambulated (ft) 25 ft  Range of Motion/Exercises Active;All extremities  Activity Response Tolerated well  Mobility visit 1 Mobility  Mobility Specialist Start Time (ACUTE ONLY) 1600  Mobility Specialist Stop Time (ACUTE ONLY) 1612  Mobility Specialist Time Calculation (min) (ACUTE ONLY) 12 min   Pt received in bathroom, not agreeable to further mobility at this time d/t fatigue. Required CGA to stand and ambulate and required assistance with peri care. Tolerated well, asx throughout. Returned to chair, belongings in reach. NT notified, all needs met.  Sherrilee Ditty Mobility Specialist Please contact via Special Educational Needs Teacher or  Rehab office at [phone]

## 2024-10-07 NOTE — Evaluation (Addendum)
 Occupational Therapy Evaluation Patient Details Name: Kimberly Scott MRN: [id number] DOB: 31-Dec-1927 Today's Date: 10/07/2024   History of Present Illness   89 year old female with PMHx of Parkinson disease, dementia, HTN, GERD, and prior ESBL UTI presenting from home on 10/03/2024 with constipation, dysuria, and lower abdominal pain. Admitted for UTI and proctitis   Clinical Impressions Ms Friddle was seen for OT evaluation this date. Prior to hospital admission, pt was MOD I using RW. Pt lives alone with daughter checking in daily. Pt presents to acute OT demonstrating impaired ADL performance and functional mobility 2/2 decreased activity tolerance. Pt currently requires SBA + RW for toilet t/f and SUPERVISION standing grooming tasks. Tolerates ~5 min standing activity. Pt would benefit from skilled OT to address noted impairments and functional limitations (see below for any additional details). Upon hospital discharge, recommend OT follow up and intermittent SUP for safety. Pt lives alone and is at high risk for deconditioning.    If plan is discharge home, recommend the following:   Help with stairs or ramp for entrance;Assistance with cooking/housework;A little help with bathing/dressing/bathroom     Functional Status Assessment   Patient has had a recent decline in their functional status and demonstrates the ability to make significant improvements in function in a reasonable and predictable amount of time.     Equipment Recommendations   BSC/3in1     Recommendations for Other Services         Precautions/Restrictions   Precautions Precautions: Fall Recall of Precautions/Restrictions: Impaired Restrictions Weight Bearing Restrictions Per Provider Order: No     Mobility Bed Mobility               General bed mobility comments: not tested    Transfers Overall transfer level: Needs assistance Equipment used: Rolling walker (2 wheels) Transfers:  Sit to/from Stand Sit to Stand: Supervision                  Balance Overall balance assessment: Needs assistance Sitting-balance support: Feet supported, No upper extremity supported Sitting balance-Leahy Scale: Good     Standing balance support: No upper extremity supported, During functional activity Standing balance-Leahy Scale: Fair                             ADL either performed or assessed with clinical judgement   ADL Overall ADL's : Needs assistance/impaired                                       General ADL Comments: SBA + RW for toilet t/f and SUPERVISION standing grooming tasks.      Pertinent Vitals/Pain Pain Assessment Pain Assessment: No/denies pain     Extremity/Trunk Assessment Upper Extremity Assessment Upper Extremity Assessment: Overall WFL for tasks assessed   Lower Extremity Assessment Lower Extremity Assessment: Generalized weakness   Cervical / Trunk Assessment Cervical / Trunk Assessment: Normal   Communication Communication Communication: Impaired Factors Affecting Communication: Hearing impaired   Cognition Arousal: Alert Behavior During Therapy: WFL for tasks assessed/performed Cognition: History of cognitive impairments             OT - Cognition Comments: poor STM deficits, orientetd to self and location                 Following commands: Intact  Home Living Family/patient expects to be discharged to:: Private residence Living Arrangements: Alone Available Help at Discharge: Family;Available PRN/intermittently Type of Home: House Home Access: Stairs to enter [employer] of Steps: 3+1 Entrance Stairs-Rails: Right Home Layout: One level;Laundry or work area in basement     [employer] Shower/Tub: Engineer, Production Accessibility: Yes   Home Equipment: Agricultural Consultant (2 wheels);Tub bench;[place] - single point   Additional Comments:  cameras around the house and outside the home. daughter and other family check in frequently. daughter takes patient out of the house frequently also and brings her to her home for showering      Prior Functioning/Environment Prior Level of Function : Needs assist;History of Falls (last six months)               ADLs Comments: assist for bathing and IADLs    OT Problem List: Decreased activity tolerance;Impaired balance (sitting and/or standing);Decreased safety awareness   OT Treatment/Interventions: Self-care/ADL training;Therapeutic exercise;Energy conservation;DME and/or AE instruction;Therapeutic activities;Patient/family education;Balance training      OT Goals(Current goals can be found in the care plan section)   Acute Rehab OT Goals Patient Stated Goal: to go home OT Goal Formulation: With patient/family Time For Goal Achievement: 10/21/24 Potential to Achieve Goals: Good ADL Goals Pt Will Perform Grooming: with modified independence;standing Pt Will Perform Lower Body Dressing: with modified independence;sit to/from stand Pt Will Transfer to Toilet: with modified independence;ambulating;regular height toilet   OT Frequency:  Min 3X/week    Co-evaluation              AM-PAC OT 6 Clicks Daily Activity     Outcome Measure Help from another person eating meals?: None Help from another person taking care of personal grooming?: A Little Help from another person toileting, which includes using toliet, bedpan, or urinal?: A Little Help from another person bathing (including washing, rinsing, drying)?: A Little Help from another person to put on and taking off regular upper body clothing?: None Help from another person to put on and taking off regular lower body clothing?: A Little 6 Click Score: 20   End of Session Equipment Utilized During Treatment: Rolling walker (2 wheels) Nurse Communication: Mobility status  Activity Tolerance: Patient tolerated  treatment well Patient left: in chair;with call bell/phone within reach;with chair alarm set;with family/visitor present  OT Visit Diagnosis: Other abnormalities of gait and mobility (R26.89);Muscle weakness (generalized) (M62.81)                Time: 9083-9046 OT Time Calculation (min): 37 min Charges:  OT General Charges $OT Visit: 1 Visit OT Evaluation $OT Eval Moderate Complexity: 1 Mod OT Treatments $Self Care/Home Management : 23-37 mins  Elston Slot, M.S. OTR/L  10/07/24, 1:07 PM  ascom [phone]

## 2024-10-07 NOTE — Evaluation (Signed)
 Physical Therapy Evaluation Patient Details Name: Kimberly Scott MRN: [id number] DOB: 08-07-1928 Today's Date: 10/07/2024  History of Present Illness  89 year old female with PMHx of Parkinson disease, dementia, HTN, GERD, and prior ESBL UTI presenting from home on 10/03/2024 with constipation, dysuria, and lower abdominal pain. Admitted for UTI and proctitis  Clinical Impression  Patient noted to be in sitting position at PT arrival in room, for an initial PT evaluation due to a decline in functional status, with baseline mobility reported as modI with history of falls, and currently requiring minA for ambulation and minA for stair navigation; requiring vc to maintain safety. The patient is A&O x 4, presenting with goof willingness to work with PT and goals of going home, with discharge expectations that include going home although family in room eager for patient to be d/c to SNF with hopes of improved compliance with exercise. The patient resides in a home and lives alone with intermittent family/friend support. There are 4 steps with L rail to enter and inside the residence.  Vitals are stable, and gait was assessed with RW, limited by safety awareness. Therapist, nutritional observations noted for RW management, flexed posture and unsteadiness with occasional knee buckling. The overall clinical impression is that the patient presents with moderate mobility limitations secondary to acute medical complications. Recommended skilled PT will address safety, mobility, and discharge planning. PT recommendation to d/c patient to SNF upon medical clearance.        If plan is discharge home, recommend the following: A little help with walking and/or transfers;A little help with bathing/dressing/bathroom;Help with stairs or ramp for entrance   Can travel by private vehicle   Yes    Equipment Recommendations None recommended by PT  Recommendations for Other Services       Functional Status Assessment  Patient has had a recent decline in their functional status and/or demonstrates limited ability to make significant improvements in function in a reasonable and predictable amount of time     Precautions / Restrictions Precautions Precautions: Fall Recall of Precautions/Restrictions: Impaired Restrictions Weight Bearing Restrictions Per Provider Order: No      Mobility  Bed Mobility               General bed mobility comments: not tested    Transfers Overall transfer level: Needs assistance Equipment used: Rolling walker (2 wheels) Transfers: Sit to/from Stand Sit to Stand: Supervision, Contact guard assist                Ambulation/Gait Ambulation/Gait assistance: Min assist, Contact guard assist Gait Distance (Feet): 100 Feet Assistive device: Rolling walker (2 wheels) Gait Pattern/deviations: Step-through pattern, Narrow base of support, Trunk flexed, Drifts right/left, Knees buckling Gait velocity: decreased     General Gait Details: vc for safety management of AD and needs heavy vc to not reach outside BOS; pt very unsteady with prolonged gait bouts  Stairs Stairs: Yes Stairs assistance: Min assist, Contact guard assist Stair Management: One rail Right, Sideways Number of Stairs: 5 General stair comments: requires vc for safety and crossing legs during ascend and decending steps; no physical assistance provided but step by steps instructions needed  Wheelchair Mobility     Tilt Bed    Modified Rankin (Stroke Patients Only)       Balance Overall balance assessment: Needs assistance Sitting-balance support: Feet supported, No upper extremity supported Sitting balance-Leahy Scale: Good     Standing balance support: No upper extremity supported, During functional activity Standing balance-Leahy Scale:  Fair                               Pertinent Vitals/Pain Pain Assessment Pain Assessment: No/denies pain    Home Living  Family/patient expects to be discharged to:: Private residence Living Arrangements: Alone Available Help at Discharge: Family;Available PRN/intermittently Type of Home: House Home Access: Stairs to enter Entrance Stairs-Rails: Right Entrance Stairs-Number of Steps: 3+1   Home Layout: One level;Laundry or work area in [employer] Equipment: Agricultural Consultant (2 wheels);Tub bench;[place] - single point Additional Comments: cameras around the house and outside the home. daughter and other family check in frequently. daughter takes patient out of the house frequently also and brings her to her home for showering    Prior Function Prior Level of Function : Needs assist;History of Falls (last six months)               ADLs Comments: assist for bathing and IADLs     Extremity/Trunk Assessment   Upper Extremity Assessment Upper Extremity Assessment: Overall WFL for tasks assessed    Lower Extremity Assessment Lower Extremity Assessment: Generalized weakness    Cervical / Trunk Assessment Cervical / Trunk Assessment: Normal  Communication   Communication Communication: Impaired Factors Affecting Communication: Hearing impaired    Cognition Arousal: Alert Behavior During Therapy: WFL for tasks assessed/performed                             Following commands: Intact       Cueing       General Comments      Exercises     Assessment/Plan    PT Assessment Patient needs continued PT services  PT Problem List Decreased strength;Decreased range of motion;Decreased activity tolerance;Decreased balance;Decreased mobility;Decreased coordination;Decreased safety awareness       PT Treatment Interventions DME instruction;Gait training;Stair training;Functional mobility training;Therapeutic activities;Therapeutic exercise;Balance training;Neuromuscular re-education;Patient/family education;Manual techniques    PT Goals (Current goals can be found in the Care Plan  section)  Acute Rehab PT Goals Patient Stated Goal: Pt wants to go home PT Goal Formulation: With patient/family Time For Goal Achievement: 10/21/24 Potential to Achieve Goals: Good    Frequency Min 2X/week     Co-evaluation               AM-PAC PT 6 Clicks Mobility  Outcome Measure Help needed turning from your back to your side while in a flat bed without using bedrails?: A Little Help needed moving from lying on your back to sitting on the side of a flat bed without using bedrails?: A Little Help needed moving to and from a bed to a chair (including a wheelchair)?: A Little Help needed standing up from a chair using your arms (e.g., wheelchair or bedside chair)?: A Little Help needed to walk in hospital room?: A Little Help needed climbing 3-5 steps with a railing? : A Little 6 Click Score: 18    End of Session Equipment Utilized During Treatment: Gait belt Activity Tolerance: Patient tolerated treatment well Patient left: in chair;with family/visitor present;with call bell/phone within reach   PT Visit Diagnosis: Unsteadiness on feet (R26.81);Other abnormalities of gait and mobility (R26.89);Muscle weakness (generalized) (M62.81);History of falling (Z91.81);Difficulty in walking, not elsewhere classified (R26.2)    Time: 1010-1059 PT Time Calculation (min) (ACUTE ONLY): 49 min   Charges:     PT Treatments $Therapeutic Activity: 23-37 mins PT  General Charges $$ ACUTE PT VISIT: 1 Visit       Sherlean Lesches DPT, PT   Sherlean A Rondey Fallen 10/07/2024, 11:15 AM

## 2024-10-08 DIAGNOSIS — K6289 Other specified diseases of anus and rectum: Secondary | ICD-10-CM | POA: Diagnosis not present

## 2024-10-08 LAB — BASIC METABOLIC PANEL WITH GFR
Anion gap: 8 (ref 5–15)
BUN: 24 mg/dL — ABNORMAL HIGH (ref 8–23)
CO2: 24 mmol/L (ref 22–32)
Calcium: 8.5 mg/dL — ABNORMAL LOW (ref 8.9–10.3)
Chloride: 104 mmol/L (ref 98–111)
Creatinine, Ser: 0.98 mg/dL (ref 0.44–1.00)
GFR, Estimated: 53 mL/min — ABNORMAL LOW (ref >60)
Glucose, Bld: 102 mg/dL — ABNORMAL HIGH (ref 70–99)
Potassium: 4.5 mmol/L (ref 3.5–5.1)
Sodium: 136 mmol/L (ref 135–145)

## 2024-10-08 LAB — CBC
HCT: 31.8 % — ABNORMAL LOW (ref 36.0–46.0)
Hemoglobin: 10.3 g/dL — ABNORMAL LOW (ref 12.0–15.0)
MCH: 30.7 pg (ref 26.0–34.0)
MCHC: 32.4 g/dL (ref 30.0–36.0)
MCV: 94.9 fL (ref 80.0–100.0)
Platelets: 226 K/uL (ref 150–400)
RBC: 3.35 MIL/uL — ABNORMAL LOW (ref 3.87–5.11)
RDW: 13.5 % (ref 11.5–15.5)
WBC: 5.7 K/uL (ref 4.0–10.5)
nRBC: 0 % (ref 0.0–0.2)

## 2024-10-08 MED ORDER — ESTRADIOL 0.01 % VA CREA
1.0000 | TOPICAL_CREAM | VAGINAL | Status: DC
  Administered 2024-10-09: 1 via VAGINAL
  Filled 2024-10-08: qty 42.5

## 2024-10-08 NOTE — Plan of Care (Signed)
   Problem: Education: Goal: Knowledge of General Education information will improve Description Including pain rating scale, medication(s)/side effects and non-pharmacologic comfort measures Outcome: Progressing

## 2024-10-08 NOTE — TOC Progression Note (Signed)
 Transition of Care [facility]) - Progression Note    Patient Details  Name: Kimberly Scott MRN: [id number] Date of Birth: December 18, 1927  Transition of [facility] CM/SW Contact  Dalia GORMAN Fuse, RN Phone Number: 10/08/2024, 4:07 PM  Clinical Narrative:     TOC received a message from the MD advising the patient's family would like to speak with TOC about STR. PT recs are currently for STR. TOC spoke with the patient's daughter and she and her brother are trying to convince their mother that str is the best option. The patient's daughter asked that we start the bed search with the understanding that the patient could ultimately choose to go home. The first choice is [facility] followed by [facility] and potentially [facility]. They may want to also pursue facilities in [facility] where the patient's son lives.   TOC sent FL2 out in  Co.  TOC will continue to follow.                    Expected Discharge Plan and Services                                               Social Drivers of Health (SDOH) Interventions SDOH Screenings   Food Insecurity: No Food Insecurity (10/04/2024)  Housing: Low Risk  (10/04/2024)  Transportation Needs: No Transportation Needs (10/04/2024)  Utilities: Not At Risk (10/04/2024)  Financial Resource Strain: Low Risk  (02/26/2024)   Received from [facility]  Physical Activity: Unknown (07/31/2019)  Social Connections: Patient Declined (10/04/2024)  Tobacco Use: Low Risk  (10/06/2024)    Readmission Risk Interventions     No data to display

## 2024-10-08 NOTE — Plan of Care (Signed)
   Problem: Clinical Measurements: Goal: Respiratory complications will improve Outcome: Progressing

## 2024-10-08 NOTE — Progress Notes (Signed)
 Occupational Therapy Treatment Patient Details Name: Kimberly Scott MRN: [id number] DOB: 03/24/1928 Today's Date: 10/08/2024   History of present illness 89 year old female with PMHx of Parkinson disease, dementia, HTN, GERD, and prior ESBL UTI presenting from home on 10/03/2024 with constipation, dysuria, and lower abdominal pain. Admitted for UTI and proctitis   OT comments  Ms Beirne was seen for OT treatment on this date. Upon arrival to room pt in chair, agreeable to tx. Pt requires SBA + RW for in room mobility ~40 ft decreasing to CGA with hallway mobility ~40 ft (cues for RW safety with turns). SUPERVISION standing grooming tasks ~3 min, pt reports fatigue and elects to complete grooming sitting, pulls chair ~22ft closer to counter before sitting. Pt making good progress toward goals, will continue to follow POC. Discharge recommendation remains appropriate.        If plan is discharge home, recommend the following:  Help with stairs or ramp for entrance;Assistance with cooking/housework;A little help with bathing/dressing/bathroom   Equipment Recommendations  BSC/3in1    Recommendations for Other Services      Precautions / Restrictions Precautions Precautions: Fall Recall of Precautions/Restrictions: Impaired Restrictions Weight Bearing Restrictions Per Provider Order: No       Mobility Bed Mobility               General bed mobility comments: not tested    Transfers Overall transfer level: Needs assistance Equipment used: Rolling walker (2 wheels) Transfers: Sit to/from Stand Sit to Stand: Supervision                 Balance Overall balance assessment: Needs assistance Sitting-balance support: Feet supported, No upper extremity supported Sitting balance-Leahy Scale: Good     Standing balance support: During functional activity, No upper extremity supported Standing balance-Leahy Scale: Fair                             ADL  either performed or assessed with clinical judgement   ADL Overall ADL's : Needs assistance/impaired                                       General ADL Comments: SBA + RW in room mobility ~40 ft decreasing to CGA with hallway mobility ~40 ft (cues ofr RW safety with turns). SUPERVISION standing grooming tasks ~3 min, pt reports fatigue and elects to complete grooming sitting, pulls chair ~58ft closer to counter before sitting.     Communication Communication Communication: Impaired Factors Affecting Communication: Hearing impaired   Cognition Arousal: Alert Behavior During Therapy: WFL for tasks assessed/performed Cognition: History of cognitive impairments             OT - Cognition Comments: STM deficits                 Following commands: Intact        Cueing   Cueing Techniques: Verbal cues             Pertinent Vitals/ Pain       Pain Assessment Pain Assessment: No/denies pain   Frequency  Min 3X/week        Progress Toward Goals  OT Goals(current goals can now be found in the care plan section)  Progress towards OT goals: Progressing toward goals  Acute Rehab OT Goals OT Goal Formulation: With patient/family Time For Goal Achievement:  10/21/24 Potential to Achieve Goals: Good ADL Goals Pt Will Perform Grooming: with modified independence;standing Pt Will Perform Lower Body Dressing: with modified independence;sit to/from stand Pt Will Transfer to Toilet: with modified independence;ambulating;regular height toilet  Plan      Co-evaluation                 AM-PAC OT 6 Clicks Daily Activity     Outcome Measure   Help from another person eating meals?: None Help from another person taking care of personal grooming?: A Little Help from another person toileting, which includes using toliet, bedpan, or urinal?: A Little Help from another person bathing (including washing, rinsing, drying)?: A Little Help from another  person to put on and taking off regular upper body clothing?: None Help from another person to put on and taking off regular lower body clothing?: A Little 6 Click Score: 20    End of Session Equipment Utilized During Treatment: Rolling walker (2 wheels)  OT Visit Diagnosis: Other abnormalities of gait and mobility (R26.89);Muscle weakness (generalized) (M62.81)   Activity Tolerance Patient tolerated treatment well   Patient Left in chair;with call bell/phone within reach;with chair alarm set   Nurse Communication          Time: 8572-8547 OT Time Calculation (min): 25 min  Charges: OT General Charges $OT Visit: 1 Visit OT Treatments $Self Care/Home Management : 8-22 mins $Therapeutic Activity: 8-22 mins  Elston Slot, M.S. OTR/L  10/08/24, 3:14 PM  ascom [phone]

## 2024-10-08 NOTE — Progress Notes (Signed)
 Mobility Specialist Progress Note:    10/08/24 1017  Mobility  Activity Ambulated with assistance  Level of Assistance Minimal assist, patient does 75% or more  Assistive Device Front wheel walker  Distance Ambulated (ft) 12 ft  Range of Motion/Exercises Active;All extremities  Activity Response Tolerated well  Mobility visit 1 Mobility  Mobility Specialist Start Time (ACUTE ONLY) 1000  Mobility Specialist Stop Time (ACUTE ONLY) 1020  Mobility Specialist Time Calculation (min) (ACUTE ONLY) 20 min   Assisted pt to bathroom, daughter in room. Required MinA to stand and ambulate with RW. Tolerated well, asx throughout. Left pt with instruction to pull string and notified NT, all needs met.  Sherrilee Ditty Mobility Specialist Please contact via Special Educational Needs Teacher or  Rehab office at [phone]

## 2024-10-08 NOTE — Progress Notes (Signed)
 PROGRESS NOTE    Kimberly Scott  FMW:[id number] DOB: July 25, 1928 DOA: 10/03/2024 PCP: Fernande Ophelia [name], MD    Brief Narrative:  89 year old female with PMHx of Parkinson disease, dementia, HTN, GERD, and prior ESBL UTI presenting from home on 10/03/2024 with constipation, dysuria, and lower abdominal pain.  EMS noted a temp of 101 F.  Per daughter, the patient was mildly confused but had no recent falls or trauma.  On exam, abdomen was soft and nontender.  UA was consistent with UTI with prior cultures growing ESBL organisms.  CT abdomen/pelvis demonstrated marked rectal wall thickening with mucosal hyperenhancement and adjacent fat stranding, suggestive inflammatory versus infectious proctitis, along with moderate colonic stool burden without obstruction.  Assessment and Plan:  # Proctitis - EMS reported patient febrile to 101 F. Leukocytosis to 14.3 on admission - CT abdomen/pelvis showed marked rectal wall thickening with mucosal hyperenhancement and surrounding fat stranding, no abscess - Possible infectious or inflammatory etiology - Afebrile, leukocytosis resolved - Discontinued meropenem  - Bowel regimen for associated constipation as below  #Constipation - Moderate colonic stool burden on CT - s/p mineral enema yesterday - Having multiple small to medium soft stools, unclear if she passed the bulk of her stool burden - Avoiding digital disimpaction given proctitis  - Continue bowel regimen with daily MiraLAX , Sennakot-S 2 tablets nightly, and PRN dulcolax daily  # UTI - Has history of UTI with ESLB organisms - EMS reported patient febrile to 101 F.  Leukocytosis 14.3 on admission.  - On arrival, patient somewhat confused per daughter's report.  Now back to baseline. - UA concerning for UTI - Urine culture showed no growth.  However sample was collected after patient received multiple doses of meropenem  - Completed 4-day course of IV meropenem   # Parkinson's disease #  Dementia - Baseline mild cognitive impairment with acute worsening likely due to infection - Continue home Sinemet  - Delirium precautions   # Sinus bradycardia - Appears chronic - Asymptomatic - EKG today showed normal sinus rhythm with RBBB  # Hypertension - Continue home Losartan  # Debility - PT/OT consulted - recommending SNF. Patient not amenable to going to SNF, but daughter wants to consider it. TOC following for bed offers.  DVT prophylaxis: enoxaparin  (LOVENOX ) injection 30 mg Start: 10/04/24 0800   Code Status:   Code Status: Do not attempt resuscitation (DNR) PRE-ARREST INTERVENTIONS DESIRED  Family Communication: Discussed with patient's daughter, Ms. Donny, at bedside  Disposition Plan: From home, PT/OT recommending SNF for STR, daughter and son hoping to convince the patient, pending bed offerr PT Follow up Recs: Skilled Nursing-Short Term Rehab (<3 Hours/Day)10/08/2024 1345 OT Follow Up Recommendations: Home health OT PT equipment: Rolling walker (2 wheels), BSC/3in1   Level of care: Med-Surg  Consultants:  None  Procedures:  None  Antimicrobials: IV Meropenem  stopped 11/20   Subjective: Patient examined at bedside.  Reports doing fine this morning, was having a hard time reaching her daughter.  Symptomatically, she reports feeling better.  Has had multiple small but soft bowel movements.  Denies any dysuria, fevers, chills, nausea, vomiting.  Objective: Vitals:   10/08/24 0325 10/08/24 0810 10/08/24 1535 10/08/24 2003  BP: (!) 121/44 (!) 117/43 (!) 147/55 (!) 179/42  Pulse: 64 (!) 57 67 (!) 57  Resp: 18 16 18 16   Temp: 98.8 F (37.1 C) 98 F (36.7 C) 98 F (36.7 C) 98.1 F (36.7 C)  TempSrc: Oral Oral Oral   SpO2: 91% 94% 98% 96%  Weight:  Height:       No intake or output data in the 24 hours ending 10/08/24 2122  Filed Weights   10/03/24 1936  Weight: 62.6 kg    Examination:  General exam: Comfortable, in no acute  distress Respiratory system: CTAB, normal respiratory effort Cardiovascular system: + S1/S2, no murmurs appreciate. No pedal edema. Gastrointestinal system: Soft, NTND, no guarding or peritonitis. Bowel sounds positive in 4 quadrants.  Central nervous system: Alert and oriented x2. No focal neurological deficits. Extremities: Strength 5x5 symmetically Skin: No rashes, lesions or ulcers Psychiatry: Judgement and insight appear limited. Mood & affect appropriate.     Data Reviewed: I have personally reviewed following labs and imaging studies  CBC: Recent Labs  Lab 10/03/24 1951 10/05/24 0550 10/06/24 1415 10/07/24 0619 10/08/24 0514  WBC 14.3* 4.9 5.2 5.8 5.7  HGB 11.0* 9.9* 11.0* 10.5* 10.3*  HCT 34.3* 30.6* 34.1* 32.9* 31.8*  MCV 97.4 96.5 97.7 96.2 94.9  PLT 233 183 208 212 226   Basic Metabolic Panel: Recent Labs  Lab 10/03/24 1951 10/05/24 0550 10/06/24 0514 10/07/24 0619 10/08/24 0514  NA 137 141 140 138 136  K 4.1 4.4 4.7 4.5 4.5  CL 104 110 109 105 104  CO2 21* 22 25 24 24   GLUCOSE 101* 88 101* 104* 102*  BUN 29* 19 20 28* 24*  CREATININE 1.29* 1.13* 1.12* 1.08* 0.98  CALCIUM  9.3 8.5* 8.5* 8.5* 8.5*   GFR: Estimated Creatinine Clearance: 30.5 mL/min (by C-G formula based on SCr of 0.98 mg/dL). Liver Function Tests: Recent Labs  Lab 10/03/24 1951  AST 32  ALT 6  ALKPHOS 52  BILITOT 1.2  PROT 7.5  ALBUMIN  3.9   Recent Labs  Lab 10/03/24 1951  LIPASE 29   No results for input(s): AMMONIA in the last 168 hours. Coagulation Profile: No results for input(s): INR, PROTIME in the last 168 hours. Cardiac Enzymes: No results for input(s): CKTOTAL, CKMB, CKMBINDEX, TROPONINI in the last 168 hours. BNP (last 3 results) No results for input(s): PROBNP in the last 8760 hours. HbA1C: No results for input(s): HGBA1C in the last 72 hours. CBG: No results for input(s): GLUCAP in the last 168 hours. Lipid Profile: No results for  input(s): CHOL, HDL, LDLCALC, TRIG, CHOLHDL, LDLDIRECT in the last 72 hours. Thyroid Function Tests: No results for input(s): TSH, T4TOTAL, FREET4, T3FREE, THYROIDAB in the last 72 hours. Anemia Panel: No results for input(s): VITAMINB12, FOLATE, FERRITIN, TIBC, IRON, RETICCTPCT in the last 72 hours. Sepsis Labs: No results for input(s): PROCALCITON, LATICACIDVEN in the last 168 hours.  Recent Results (from the past 240 hours)  Urine Culture (for pregnant, neutropenic or urologic patients or patients with an indwelling urinary catheter)     Status: None   Collection Time: 10/05/24 10:23 PM   Specimen: Urine, Clean Catch  Result Value Ref Range Status   Specimen Description   Final    URINE, CLEAN CATCH Performed at [facility], [address] [place], [place] [address]    Special Requests   Final    NONE Performed at [facility], [address], [place], [place] [address]    Culture   Final    NO GROWTH Performed at [facility], [address], [place], [place] [address]    Report Status 10/07/2024 FINAL  Final     Radiology Studies: No results found.  Scheduled Meds:  aspirin  EC  81 mg Oral Daily   atorvastatin   40 mg  Oral q1800   carbidopa -levodopa   1 tablet Oral 2 times per day   carbidopa -levodopa   2 tablet Oral Q0600   enoxaparin  (LOVENOX ) injection  30 mg Subcutaneous Q24H   [START ON 10/09/2024] estradiol   1 Applicatorful Vaginal Once per day on Monday Wednesday Friday   losartan  25 mg Oral Daily   multivitamin with minerals  1 tablet Oral Daily   polyethylene glycol  17 g Oral Daily   senna-docusate  2 tablet Oral QHS   Continuous Infusions:  LOS:  LOS: 4 days   Time Spent: 40 minutes  Unresulted Labs (From admission, onward)     Start     Ordered   10/11/24 0500  Creatinine, serum  (enoxaparin  (LOVENOX )    CrCl >/= 30 ml/min)  Weekly,   STAT     Comments: while on enoxaparin  therapy     10/04/24 0512   10/07/24 0500  Basic metabolic panel with GFR  Daily,   R     Question:  Specimen collection method  Answer:  Lab=Lab collect   10/06/24 1105   10/06/24 1106  CBC  Daily,   R     Question:  Specimen collection method  Answer:  Lab=Lab collect   10/06/24 1105            Kitzia Camus Al-Sultani, MD [facility]  If 7PM-7AM, please contact night-coverage  10/08/2024, 9:22 PM

## 2024-10-08 NOTE — Progress Notes (Signed)
 Physical Therapy Treatment Patient Details Name: Kimberly Scott MRN: [id number] DOB: 05-21-1928 Today's Date: 10/08/2024   History of Present Illness 89 year old female with PMHx of Parkinson disease, dementia, HTN, GERD, and prior ESBL UTI presenting from home on 10/03/2024 with constipation, dysuria, and lower abdominal pain. Admitted for UTI and proctitis    PT Comments  Pt ready for session.  She is able to get to EOB with increased time and bed features.  Steady in sitting.  She stands to RW with cga x 1 and is able to walk about 5' to [facility] with rw and cga x 1 and manage her needs.  She stands and walks 10' with RW way too far out in front of her despite cues to keep inside of walker box.  When questioned she stated she used a rollator and transitioned to one in the hallway.  She seemed unaware of brakes as she struggled to push walker and needed assist to disengage brakes.  She continued with poor gait quality with noted fatigue and she asked to sit.  She struggled to turn to sit on rollator and needed assist to fully turn.  She is again questioned which walker she used.  Suggested RW for safety and she agreed but could not confirm what type she had.  She is able to walk 25' back to room where she sat in recliner needing continued min a x 1 for general safety.    Discussed DC plan with pt as she stated she wants to return home.  Stated she is uninterested in rehab and seems concerned over her son in law looking for her money in her home and wants to get back to see if it is still there.  Will discuss with team.  If she does opt to return home, she would benefit from RW, [facility] and all supportive home health services available to her.  Encouraged pt to talk with family about +24 hour support at home.     If plan is discharge home, recommend the following: A little help with walking and/or transfers;A little help with bathing/dressing/bathroom;Help with stairs or ramp for entrance;Assist for  transportation;Assistance with cooking/housework   Can travel by private Automotive Engineer (2 wheels);BSC/3in1    Recommendations for Other Services       Precautions / Restrictions Precautions Precautions: Fall Recall of Precautions/Restrictions: Impaired Restrictions Weight Bearing Restrictions Per Provider Order: No     Mobility  Bed Mobility Overal bed mobility: Needs Assistance Bed Mobility: Supine to Sit     Supine to sit: Supervision, Used rails       Patient Response: Cooperative  Transfers Overall transfer level: Needs assistance Equipment used: Rolling walker (2 wheels) Transfers: Sit to/from Stand Sit to Stand: Supervision, Contact guard assist                Ambulation/Gait Ambulation/Gait assistance: Min assist Gait Distance (Feet): 30 Feet Assistive device: Rolling walker (2 wheels), Rollator (4 wheels) Gait Pattern/deviations: Step-through pattern, Narrow base of support, Trunk flexed, Drifts right/left, Knees buckling Gait velocity: decreased     General Gait Details: poor use of both rollator and RW - gait quality decreases with distance.  does OK for transfers and short distances to [facility] in room but in hallway needs increased assist   Stairs             Wheelchair Mobility     Tilt Bed Tilt Bed Patient Response:  Cooperative  Modified Rankin (Stroke Patients Only)       Balance Overall balance assessment: Needs assistance Sitting-balance support: Feet supported, No upper extremity supported Sitting balance-Leahy Scale: Good     Standing balance support: During functional activity, Bilateral upper extremity supported Standing balance-Leahy Scale: Fair Standing balance comment: statically does well but with increased gait distances it decreases                            Communication Communication Communication: Impaired Factors Affecting Communication: Hearing  impaired  Cognition Arousal: Alert Behavior During Therapy: WFL for tasks assessed/performed   PT - Cognitive impairments: No family/caregiver present to determine baseline                       PT - Cognition Comments: has trouble telling me what AD she used  RW vs rollator Following commands: Intact      Cueing Cueing Techniques: Verbal cues  Exercises Other Exercises Other Exercises: to [facility] to void.  manages her own needs but does need some help finishing pulling up her underwear    General Comments        Pertinent Vitals/Pain Pain Assessment Pain Assessment: No/denies pain    Home Living                          Prior Function            PT Goals (current goals can now be found in the care plan section) Progress towards PT goals: Progressing toward goals    Frequency    Min 2X/week      PT Plan      Co-evaluation              AM-PAC PT 6 Clicks Mobility   Outcome Measure  Help needed turning from your back to your side while in a flat bed without using bedrails?: A Little Help needed moving from lying on your back to sitting on the side of a flat bed without using bedrails?: A Little Help needed moving to and from a bed to a chair (including a wheelchair)?: A Little Help needed standing up from a chair using your arms (e.g., wheelchair or bedside chair)?: A Little Help needed to walk in hospital room?: A Little Help needed climbing 3-5 steps with a railing? : A Lot 6 Click Score: 17    End of Session Equipment Utilized During Treatment: Gait belt Activity Tolerance: Patient tolerated treatment well;Patient limited by fatigue Patient left: in chair;with call bell/phone within reach;with chair alarm set Nurse Communication: Mobility status PT Visit Diagnosis: Unsteadiness on feet (R26.81);Other abnormalities of gait and mobility (R26.89);Muscle weakness (generalized) (M62.81);History of falling (Z91.81);Difficulty in walking,  not elsewhere classified (R26.2)     Time: 8687-8663 PT Time Calculation (min) (ACUTE ONLY): 24 min  Charges:    $Gait Training: 8-22 mins $Therapeutic Activity: 8-22 mins PT General Charges $$ ACUTE PT VISIT: 1 Visit                   Lauraine Gills, PTA 10/08/24, 1:52 PM

## 2024-10-08 NOTE — NC FL2 (Signed)
 [place]  MEDICAID [id number] LEVEL OF CARE FORM     IDENTIFICATION  Patient Name: Kimberly Scott Birthdate: 12-27-1927 Sex: female Admission Date (Current Location): 10/03/2024  [facility] and [place] Number:  Chiropodist and Address:  [facility], [address], [place], [place] [address]      Provider Number: [id number]  Attending Physician Name and Address:  Mosie Ford, MD  Relative Name and Phone Number:  Janit Donny Helling (Daughter) [phone]    Current Level of Care: SNF Recommended Level of Care: Skilled Nursing Facility Prior Approval Number:    Date Approved/Denied:   PASRR Number: [id number]  Discharge Plan: Home    Current Diagnoses: Patient Active Problem List   Diagnosis Date Noted   Proctitis 10/04/2024   Bacteremia 03/10/2024   Complicated UTI (urinary tract infection) 03/09/2024   Alzheimer dementia (HCC) 03/09/2024   Atrophic vaginitis 03/29/2023   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 03/29/2023   Urinary tract infection without hematuria 03/29/2023   Atherosclerosis of coronary artery without angina pectoris 03/29/2023   Falls 03/24/2023   Dysphagia 03/24/2023   Prediabetes 05/01/2022   Generalized weakness 12/09/2021   Right shoulder pain 12/09/2021   COVID-19 virus infection 12/08/2021   Peripheral neuropathy 12/08/2021   Parkinson's disease (HCC) 12/08/2021   Stage 3b chronic kidney disease (HCC) 08/20/2021   Hyponatremia 05/21/2018   TIA (transient ischemic attack)    Acute metabolic encephalopathy 05/20/2018   Coronary arteriosclerosis 09/28/2012   Hypertension 09/28/2012   Hyperlipidemia 09/28/2012   GERD (gastroesophageal reflux disease) 09/28/2012   Breast cyst 09/28/2012    Orientation RESPIRATION BLADDER Height & Weight     Self, Time, Place    Incontinent Weight: 62.6 kg Height:  5' 2 (157.5 cm)  BEHAVIORAL SYMPTOMS/MOOD NEUROLOGICAL BOWEL NUTRITION STATUS      Incontinent Diet (Soft)   AMBULATORY STATUS COMMUNICATION OF NEEDS Skin   Limited Assist Verbally                         Personal Care Assistance Level of Assistance  Bathing, Feeding, Dressing Bathing Assistance: Limited assistance Feeding assistance: Limited assistance Dressing Assistance: Limited assistance     Functional Limitations Info             SPECIAL CARE FACTORS FREQUENCY  PT (By licensed PT), OT (By licensed OT)     PT Frequency: 5 x week OT Frequency: 5 x week            Contractures      Additional Factors Info  Code Status, Allergies Code Status Info: DNR Allergies Info: Bacitracin-neomycin-polymyxin, Ace Inhibitors, Omega 3  (Fish Oil), Neosporin (Neomycin-bacitracin Zn-polymyx), Tape           Current Medications (10/08/2024):  This is the current hospital active medication list Current Facility-Administered Medications  Medication Dose Route Frequency Provider Last Rate Last Admin   acetaminophen  (TYLENOL ) tablet 650 mg  650 mg Oral Q6H PRN Duncan, Hazel V, MD       Or   acetaminophen  (TYLENOL ) suppository 650 mg  650 mg Rectal Q6H PRN Duncan, Hazel V, MD       aspirin  EC tablet 81 mg  81 mg Oral Daily Kadali, Renuka A, MD   81 mg at 10/08/24 0839   atorvastatin  (LIPITOR) tablet 40 mg  40 mg Oral q1800 Kadali, Renuka A, MD   40 mg at 10/07/24 1713   bisacodyl  (DULCOLAX) EC tablet 5 mg  5 mg Oral Daily  PRN Al-Sultani, Anmar, MD   5 mg at 10/06/24 1022   bisacodyl  (DULCOLAX) suppository 10 mg  10 mg Rectal Daily PRN Kadali, Renuka A, MD   10 mg at 10/07/24 1111   carbidopa -levodopa  (SINEMET  IR) 25-100 MG per tablet immediate release 1 tablet  1 tablet Oral 2 times per day Niels Kayla FALCON, [facility]   1 tablet at 10/08/24 1136   carbidopa -levodopa  (SINEMET  IR) 25-100 MG per tablet immediate release 2 tablet  2 tablet Oral Q0600 Kadali, Renuka A, MD   2 tablet at 10/08/24 0524   enoxaparin  (LOVENOX ) injection 30 mg  30 mg Subcutaneous Q24H Duncan, Hazel V, MD   30 mg at  10/08/24 9241   hydrALAZINE  (APRESOLINE ) injection 10 mg  10 mg Intravenous Q6H PRN Al-Sultani, Anmar, MD   10 mg at 10/07/24 2048   HYDROcodone -acetaminophen  (NORCO/VICODIN) 5-325 MG per tablet 1-2 tablet  1-2 tablet Oral Q4H PRN Duncan, Hazel V, MD       losartan (COZAAR) tablet 25 mg  25 mg Oral Daily Al-Sultani, Anmar, MD   25 mg at 10/08/24 0839   morphine  (PF) 2 MG/ML injection 2 mg  2 mg Intravenous Q2H PRN Duncan, Hazel V, MD       multivitamin with minerals tablet 1 tablet  1 tablet Oral Daily Kadali, Renuka A, MD   1 tablet at 10/08/24 9160   ondansetron  (ZOFRAN ) tablet 4 mg  4 mg Oral Q6H PRN Duncan, Hazel V, MD       Or   ondansetron  (ZOFRAN ) injection 4 mg  4 mg Intravenous Q6H PRN Duncan, Hazel V, MD       Oral care mouth rinse  15 mL Mouth Rinse PRN Kadali, Renuka A, MD       polyethylene glycol (MIRALAX  / GLYCOLAX ) packet 17 g  17 g Oral Daily Niels Kayla FALCON, RPH   17 g at 10/08/24 9160   senna-docusate (Senokot-S) tablet 2 tablet  2 tablet Oral QHS Al-Sultani, Anmar, MD   2 tablet at 10/07/24 2045     Discharge Medications: Please see discharge summary for a list of discharge medications.  Relevant Imaging Results:  Relevant Lab Results:   Additional Information [id number]  Dalia GORMAN Fuse, RN

## 2024-10-09 DIAGNOSIS — K6289 Other specified diseases of anus and rectum: Secondary | ICD-10-CM | POA: Diagnosis not present

## 2024-10-09 LAB — BASIC METABOLIC PANEL WITH GFR
Anion gap: 8 (ref 5–15)
BUN: 20 mg/dL (ref 8–23)
CO2: 26 mmol/L (ref 22–32)
Calcium: 9.1 mg/dL (ref 8.9–10.3)
Chloride: 105 mmol/L (ref 98–111)
Creatinine, Ser: 1.13 mg/dL — ABNORMAL HIGH (ref 0.44–1.00)
GFR, Estimated: 45 mL/min — ABNORMAL LOW (ref >60)
Glucose, Bld: 100 mg/dL — ABNORMAL HIGH (ref 70–99)
Potassium: 4.9 mmol/L (ref 3.5–5.1)
Sodium: 138 mmol/L (ref 135–145)

## 2024-10-09 LAB — CBC
HCT: 31.9 % — ABNORMAL LOW (ref 36.0–46.0)
Hemoglobin: 10.2 g/dL — ABNORMAL LOW (ref 12.0–15.0)
MCH: 31.1 pg (ref 26.0–34.0)
MCHC: 32 g/dL (ref 30.0–36.0)
MCV: 97.3 fL (ref 80.0–100.0)
Platelets: 214 K/uL (ref 150–400)
RBC: 3.28 MIL/uL — ABNORMAL LOW (ref 3.87–5.11)
RDW: 13.6 % (ref 11.5–15.5)
WBC: 5.2 K/uL (ref 4.0–10.5)
nRBC: 0 % (ref 0.0–0.2)

## 2024-10-09 MED ORDER — NYSTATIN 100000 UNIT/GM EX CREA
TOPICAL_CREAM | Freq: Two times a day (BID) | CUTANEOUS | Status: DC
  Filled 2024-10-09: qty 30

## 2024-10-09 MED ORDER — FLUCONAZOLE 50 MG PO TABS
150.0000 mg | ORAL_TABLET | Freq: Once | ORAL | Status: AC
  Administered 2024-10-09: 150 mg via ORAL
  Filled 2024-10-09: qty 1

## 2024-10-09 MED ORDER — NYSTATIN 100000 UNIT/GM EX POWD
Freq: Two times a day (BID) | CUTANEOUS | Status: DC
  Filled 2024-10-09: qty 15

## 2024-10-09 NOTE — TOC Progression Note (Addendum)
 Transition of Care [facility]) - Progression Note    Patient Details  Name: Kimberly Scott MRN: [id number] Date of Birth: 05/02/1928  [facility] of [facility]) CM/SW Contact  Dalia GORMAN Fuse, RN Phone Number: 10/09/2024, 9:13 AM  Clinical Narrative:    TOC received text message from the patient's daughter with a list of facilities in [facility] and request to do a bed search in that area. TOC sent FL2 to [facility] in [place], [facility] in [place], and [facility] in [place].  TOC received a call from the patient's daughter, Donny Sar. She wanted to be sure that TOC sent FL2 out to 4 and 5 star facilities in [facility] TOC reviewed the Medicare Star Ratings:  [facility] [place] 2 star [facility] 1 star [facility] 2 star  TOC advised there are 4 star facilities in [place] 4 star [facility] 5 star [facility] 3 star   TOC sent FL2 out in [place]. TOC povided Cathy with the contact info for [employer] [facility]                                               Social Drivers of Health (SDOH) Interventions SDOH Screenings   Food Insecurity: No Food Insecurity (10/04/2024)  Housing: Low Risk  (10/04/2024)  Transportation Needs: No Transportation Needs (10/04/2024)  Utilities: Not At Risk (10/04/2024)  Financial Resource Strain: Low Risk  (02/26/2024)   Received from [facility]  Physical Activity: Unknown (07/31/2019)  Social Connections: Patient Declined (10/04/2024)  Tobacco Use: Low Risk  (10/06/2024)    Readmission Risk Interventions     No data to display

## 2024-10-09 NOTE — Plan of Care (Signed)

## 2024-10-09 NOTE — Progress Notes (Signed)
 PT Cancellation Note  Patient Details Name: Kimberly Scott MRN: [id number] DOB: Sep 19, 1928   Cancelled Treatment:     Therapist in this pm, pt resting in bed refusing any activity or out of bed due to feeling cold. Therapist offered to increase room temp and educated pt on the importance of daily mobility. Pt continued to decline. Will re-attempt next available date/time per POC.    [name] JAYSON Bohr 10/09/2024, 1:46 PM

## 2024-10-09 NOTE — Progress Notes (Signed)
 Occupational Therapy Treatment Patient Details Name: Kimberly Scott MRN: [id number] DOB: 1927/12/26 Today's Date: 10/09/2024   History of present illness 89 year old female with PMHx of Parkinson disease, dementia, HTN, GERD, and prior ESBL UTI presenting from home on 10/03/2024 with constipation, dysuria, and lower abdominal pain. Admitted for UTI and proctitis   OT comments  Kimberly Scott was seen for OT treatment on this date. Upon arrival to room pt in bed, agreeable to tx. Pt requires SBA + RW toilet t/f. SUPERVISION standing grooming tasks. SETUP don B shoes in sitting. Requires cues t/o to attend to task and initiate ADLs; remains fall risk. Pt making progress toward goals, will continue to follow POC. Upon discharge recommend assist for IADLs and supervision for safety.       If plan is discharge home, recommend the following:  Help with stairs or ramp for entrance;Assistance with cooking/housework;A little help with bathing/dressing/bathroom   Equipment Recommendations  BSC/3in1    Recommendations for Other Services      Precautions / Restrictions Precautions Precautions: Fall Recall of Precautions/Restrictions: Impaired Restrictions Weight Bearing Restrictions Per Provider Order: No       Mobility Bed Mobility Overal bed mobility: Needs Assistance Bed Mobility: Supine to Sit, Sit to Supine     Supine to sit: Supervision Sit to supine: Supervision        Transfers Overall transfer level: Needs assistance Equipment used: Rolling walker (2 wheels) Transfers: Sit to/from Stand Sit to Stand: Supervision                 Balance Overall balance assessment: Needs assistance Sitting-balance support: Feet supported, No upper extremity supported Sitting balance-Leahy Scale: Good     Standing balance support: During functional activity, No upper extremity supported Standing balance-Leahy Scale: Fair                             ADL either  performed or assessed with clinical judgement   ADL Overall ADL's : Needs assistance/impaired                                       General ADL Comments: SBA + RW toilet t/f. SUPERVISION standing grooming tasks. SETUP don B shoes in sitting     Communication Communication Communication: Impaired Factors Affecting Communication: Hearing impaired   Cognition Arousal: Alert Behavior During Therapy: WFL for tasks assessed/performed Cognition: History of cognitive impairments             OT - Cognition Comments: STM deficits                 Following commands: Intact        Cueing   Cueing Techniques: Verbal cues             Pertinent Vitals/ Pain       Pain Assessment Pain Assessment: No/denies pain   Frequency  Min 3X/week        Progress Toward Goals  OT Goals(current goals can now be found in the care plan section)  Progress towards OT goals: Progressing toward goals  Acute Rehab OT Goals OT Goal Formulation: With patient/family Time For Goal Achievement: 10/21/24 Potential to Achieve Goals: Good ADL Goals Pt Will Perform Grooming: with modified independence;standing Pt Will Perform Lower Body Dressing: with modified independence;sit to/from stand Pt Will Transfer to Toilet: with modified independence;ambulating;regular height  toilet  Plan      Co-evaluation                 AM-PAC OT 6 Clicks Daily Activity     Outcome Measure   Help from another person eating meals?: None Help from another person taking care of personal grooming?: A Little Help from another person toileting, which includes using toliet, bedpan, or urinal?: A Little Help from another person bathing (including washing, rinsing, drying)?: A Little Help from another person to put on and taking off regular upper body clothing?: None Help from another person to put on and taking off regular lower body clothing?: A Little 6 Click Score: 20    End of  Session Equipment Utilized During Treatment: Rolling walker (2 wheels)  OT Visit Diagnosis: Other abnormalities of gait and mobility (R26.89);Muscle weakness (generalized) (M62.81)   Activity Tolerance Patient tolerated treatment well   Patient Left in chair;with call bell/phone within reach;with chair alarm set   Nurse Communication Mobility status        Time: 8498-8468 OT Time Calculation (min): 30 min  Charges: OT General Charges $OT Visit: 1 Visit OT Treatments $Self Care/Home Management : 23-37 mins  Elston Slot, M.S. OTR/L  10/09/24, 3:38 PM  ascom [phone]

## 2024-10-09 NOTE — Progress Notes (Signed)
 PROGRESS NOTE    Kimberly Scott  FMW:[id number] DOB: [birth year] DOA: 10/03/2024 PCP: Fernande Ophelia [name], MD    Brief Narrative:   89 year old female with PMHx of Parkinson disease, dementia, HTN, GERD, and prior ESBL UTI presenting from home on 10/03/2024 with constipation, dysuria, and lower abdominal pain.  EMS noted a temp of 101 F.  Per daughter, the patient was mildly confused but had no recent falls or trauma.  On exam, abdomen was soft and nontender.  UA was consistent with UTI with prior cultures growing ESBL organisms.  CT abdomen/pelvis demonstrated marked rectal wall thickening with mucosal hyperenhancement and adjacent fat stranding, suggestive inflammatory versus infectious proctitis, along with moderate colonic stool burden without obstruction.    Assessment & Plan:   Principal Problem:   Proctitis   # Proctitis - EMS reported patient febrile to 101 F. Leukocytosis to 14.3 on admission - CT abdomen/pelvis showed marked rectal wall thickening with mucosal hyperenhancement and surrounding fat stranding, no abscess - Possible infectious or inflammatory etiology - Afebrile, leukocytosis resolved - Discontinued meropenem  - Bowel regimen for associated constipation as below   #Constipation - Moderate colonic stool burden on CT - s/p mineral enema - Having multiple small to medium soft stools, unclear if she passed the bulk of her stool burden - Avoiding digital disimpaction given proctitis  - Continue bowel regimen with daily MiraLAX , Sennakot-S 2 tablets nightly, and PRN dulcolax daily   # UTI - Has history of UTI with ESLB organisms - EMS reported patient febrile to 101 F.  Leukocytosis 14.3 on admission.  - On arrival, patient somewhat confused per daughter's report.  Now back to baseline. - UA concerning for UTI - Urine culture showed no growth.  However sample was collected after patient received multiple doses of meropenem  - Completed 4-day course of IV  meropenem  - No futher abx - Monitor vitals and fever curve   # Parkinson's disease # Dementia - Baseline mild cognitive impairment with acute worsening likely due to infection - Continue home Sinemet  - Delirium precautions   # Sinus bradycardia - Appears chronic - Asymptomatic - EKG today showed normal sinus rhythm with RBBB   # Hypertension - Continue home Losartan   # Debility - PT/OT consulted - recommending SNF.  - Patient and family in agreement - Bed search in progress   DVT prophylaxis: Lovenox  Code Status: DNR Family Communication: Daughter at bedside 11/12 Disposition Plan: Status is: Inpatient Remains inpatient appropriate because: Medically stable.  Pending discharge to skilled nursing facility.   Level of care: Med-Surg  Consultants:  None  Procedures:  None  Antimicrobials: None   Subjective: Seen and examined.  Resting in bed.  No visible distress.  No complaints.  Answers all questions appropriately.  Daughter at bedside.  Objective: Vitals:   10/08/24 2003 10/08/24 2236 10/09/24 0522 10/09/24 0824  BP: (!) 179/42 (!) 146/54 (!) 146/62 (!) 122/44  Pulse: (!) 57 64 62 (!) 56  Resp: 16 18 14 18   Temp: 98.1 F (36.7 C) 98.3 F (36.8 C) 98.1 F (36.7 C) 97.8 F (36.6 C)  TempSrc: Oral Oral Oral Oral  SpO2: 96% 97% 95% 95%  Weight:      Height:        Intake/Output Summary (Last 24 hours) at 10/09/2024 1151 Last data filed at 10/08/2024 2100 Gross per 24 hour  Intake 240 ml  Output --  Net 240 ml   Filed Weights   10/03/24 1936  Weight: 62.6 kg  Examination:  General exam: Appears calm and comfortable.  Frail-appearing Respiratory system: Clear to auscultation. Respiratory effort normal. Cardiovascular system: S1-S2, RRR, no murmurs, no pedal edema Gastrointestinal system: Thin, soft, NT/ND, normal bowel sounds Central nervous system: Alert and oriented x 2. No focal neurological deficits. Extremities: Symmetric 5 x 5  power. Skin: No rashes, lesions or ulcers Psychiatry: Judgement and insight appear impaired. Mood & affect flattened.     Data Reviewed: I have personally reviewed following labs and imaging studies  CBC: Recent Labs  Lab 10/05/24 0550 10/06/24 1415 10/07/24 0619 10/08/24 0514 10/09/24 0434  WBC 4.9 5.2 5.8 5.7 5.2  HGB 9.9* 11.0* 10.5* 10.3* 10.2*  HCT 30.6* 34.1* 32.9* 31.8* 31.9*  MCV 96.5 97.7 96.2 94.9 97.3  PLT 183 208 212 226 214   Basic Metabolic Panel: Recent Labs  Lab 10/05/24 0550 10/06/24 0514 10/07/24 0619 10/08/24 0514 10/09/24 0434  NA 141 140 138 136 138  K 4.4 4.7 4.5 4.5 4.9  CL 110 109 105 104 105  CO2 22 25 24 24 26   GLUCOSE 88 101* 104* 102* 100*  BUN 19 20 28* 24* 20  CREATININE 1.13* 1.12* 1.08* 0.98 1.13*  CALCIUM  8.5* 8.5* 8.5* 8.5* 9.1   GFR: Estimated Creatinine Clearance: 26.5 mL/min (A) (by C-G formula based on SCr of 1.13 mg/dL (H)). Liver Function Tests: Recent Labs  Lab 10/03/24 1951  AST 32  ALT 6  ALKPHOS 52  BILITOT 1.2  PROT 7.5  ALBUMIN  3.9   Recent Labs  Lab 10/03/24 1951  LIPASE 29   No results for input(s): AMMONIA in the last 168 hours. Coagulation Profile: No results for input(s): INR, PROTIME in the last 168 hours. Cardiac Enzymes: No results for input(s): CKTOTAL, CKMB, CKMBINDEX, TROPONINI in the last 168 hours. BNP (last 3 results) No results for input(s): PROBNP in the last 8760 hours. HbA1C: No results for input(s): HGBA1C in the last 72 hours. CBG: No results for input(s): GLUCAP in the last 168 hours. Lipid Profile: No results for input(s): CHOL, HDL, LDLCALC, TRIG, CHOLHDL, LDLDIRECT in the last 72 hours. Thyroid Function Tests: No results for input(s): TSH, T4TOTAL, FREET4, T3FREE, THYROIDAB in the last 72 hours. Anemia Panel: No results for input(s): VITAMINB12, FOLATE, FERRITIN, TIBC, IRON, RETICCTPCT in the last 72 hours. Sepsis  Labs: No results for input(s): PROCALCITON, LATICACIDVEN in the last 168 hours.  Recent Results (from the past 240 hours)  Urine Culture (for pregnant, neutropenic or urologic patients or patients with an indwelling urinary catheter)     Status: None   Collection Time: 10/05/24 10:23 PM   Specimen: Urine, Clean Catch  Result Value Ref Range Status   Specimen Description   Final    URINE, CLEAN CATCH Performed at [facility], [address] [place], [place] [address]    Special Requests   Final    NONE Performed at [facility], [address], [place], [place] [address]    Culture   Final    NO GROWTH Performed at [facility], [address], [place], [place] [address]    Report Status 10/07/2024 FINAL  Final         Radiology Studies: No results found.      Scheduled Meds:  aspirin  EC  81 mg Oral Daily   atorvastatin   40 mg Oral q1800   carbidopa -levodopa   1 tablet Oral 2 times per day   carbidopa -levodopa   2 tablet Oral Q0600   enoxaparin  (LOVENOX )  injection  30 mg Subcutaneous Q24H   estradiol   1 Applicatorful Vaginal Once per day on Monday Wednesday Friday   fluconazole  150 mg Oral Once   losartan  25 mg Oral Daily   multivitamin with minerals  1 tablet Oral Daily   nystatin cream   Topical BID   polyethylene glycol  17 g Oral Daily   senna-docusate  2 tablet Oral QHS   Continuous Infusions:   LOS: 5 days      Kimberly KATHEE Robson, MD [facility]   If 7PM-7AM, please contact night-coverage  10/09/2024, 11:51 AM

## 2024-10-10 DIAGNOSIS — K6289 Other specified diseases of anus and rectum: Secondary | ICD-10-CM | POA: Diagnosis not present

## 2024-10-10 MED ORDER — TRAMADOL HCL 50 MG PO TABS: 25.0000 mg | ORAL_TABLET | Freq: Every day | ORAL | 0 refills | Status: AC | PRN

## 2024-10-10 MED ORDER — NYSTATIN 100000 UNIT/GM EX CREA: TOPICAL_CREAM | Freq: Two times a day (BID) | CUTANEOUS | Status: AC

## 2024-10-10 MED ORDER — SENNOSIDES-DOCUSATE SODIUM 8.6-50 MG PO TABS: 2.0000 | ORAL_TABLET | Freq: Every day | ORAL | Status: AC

## 2024-10-10 NOTE — Discharge Summary (Signed)
 Physician Discharge Summary  Kimberly Scott FMW:[id number] DOB: 1928/08/20 DOA: 10/03/2024  PCP: Kimberly Ophelia [name], MD  Admit date: 10/03/2024 Discharge date: 10/10/2024  Admitted From: Home Disposition:  SNF  Recommendations for Outpatient Follow-up:  Follow up with PCP in 1-2 weeks   Home Health:No  Equipment/Devices:None   Discharge Condition:Stable  CODE STATUS:DNR  Diet recommendation: Reg  Brief/Interim Summary:  89 year old female with PMHx of Parkinson disease, dementia, HTN, GERD, and prior ESBL UTI presenting from home on 10/03/2024 with constipation, dysuria, and lower abdominal pain.  EMS noted a temp of 101 F.  Per daughter, the patient was mildly confused but had no recent falls or trauma.  On exam, abdomen was soft and nontender.  UA was consistent with UTI with prior cultures growing ESBL organisms.  CT abdomen/pelvis demonstrated marked rectal wall thickening with mucosal hyperenhancement and adjacent fat stranding, suggestive inflammatory versus infectious proctitis, along with moderate colonic stool burden without obstruction.       Discharge Diagnoses:  Principal Problem:   Proctitis  # Proctitis - EMS reported patient febrile to 101 F. Leukocytosis to 14.3 on admission - CT abdomen/pelvis showed marked rectal wall thickening with mucosal hyperenhancement and surrounding fat stranding, no abscess - Possible infectious or inflammatory etiology - Afebrile, leukocytosis resolved - Discontinued meropenem  - Continue Bowel regimen for associated constipation as below   #Constipation - Moderate colonic stool burden on CT - s/p mineral enema - Having multiple small to medium soft stools, unclear if she passed the bulk of her stool burden - Avoiding digital disimpaction given proctitis  - Continue bowel regimen with daily MiraLAX , Sennakot-S 2 tablets nightly, and PRN dulcolax daily   # UTI - Has history of UTI with ESLB organisms - EMS reported patient  febrile to 101 F.  Leukocytosis 14.3 on admission.  - On arrival, patient somewhat confused per daughter's report.  Now back to baseline. - UA concerning for UTI - Urine culture showed no growth.  However sample was collected after patient received multiple doses of meropenem  - Completed 4-day course of IV meropenem  - No futher abx    # Parkinson's disease # Dementia - Baseline mild cognitive impairment with acute worsening likely due to infection - Continue home Sinemet  - Delirium precautions   # Sinus bradycardia - Appears chronic - Asymptomatic   # Hypertension - Continue home Losartan   # Debility - PT/OT consulted - recommending SNF.  - Patient and family in agreement - [facility] has offered a bed.  Insurance authorization initiated 11/13 - Received authorization 11/13.  Patient discharged in stable condition   Discharge Instructions  Discharge Instructions     Diet - low sodium heart healthy   Complete by: As directed    Increase activity slowly   Complete by: As directed       Allergies as of 10/10/2024       Reactions   Bacitracin-neomycin-polymyxin Swelling, Rash   Blisters Blisters   Ace Inhibitors    Other reaction(s): Cough   Omega 3  [fish Oil] Other (See Comments)   Neosporin [neomycin-bacitracin Zn-polymyx] Rash   Tape Rash   Blisters        Medication List     STOP taking these medications    potassium chloride  SA 20 MEQ tablet Commonly known as: KLOR-CON  M       TAKE these medications    acetaminophen  325 MG tablet Commonly known as: TYLENOL  Take 2 tablets (650 mg total) by mouth every  6 (six) hours as needed for mild pain, headache or fever.   acidophilus Caps capsule Take 1 capsule by mouth daily.   ascorbic acid  500 MG tablet Commonly known as: VITAMIN C  Take 2 tablets (1,000 mg total) by mouth daily.   aspirin  EC 81 MG tablet Take 81 mg by mouth daily.   atorvastatin  40 MG tablet Commonly known as:  LIPITOR Take 1 tablet (40 mg total) by mouth daily at 6 PM.   carbidopa -levodopa  25-100 MG tablet Commonly known as: SINEMET  IR Take 2 tablets by mouth as directed. Take 2 tabs in the morning and 1 tablet at noon and dinner time   Cholecalciferol  125 MCG (5000 UT) capsule Take 5,000 Units by mouth daily.   Cranberry 4200 MG Caps Take 1 capsule by mouth daily.   cyanocobalamin  1000 MCG/ML injection Commonly known as: VITAMIN B12 Inject 1,000 mcg into the muscle every 30 (thirty) days. Last dose 11/20/21   diclofenac  Sodium 1 % Gel Commonly known as: VOLTAREN  Apply 2 g topically 4 (four) times daily.   DSS 100 MG Caps Take 100 mg by mouth 2 (two) times daily as needed (constipation).   estradiol  0.1 MG/GM vaginal cream Commonly known as: ESTRACE  Place 1 Applicatorful vaginally 3 (three) times a week.   feeding supplement Liqd Take 237 mLs by mouth 3 (three) times daily between meals.   furosemide  20 MG tablet Commonly known as: LASIX  Take 20 mg by mouth daily as needed for edema.   losartan 25 MG tablet Commonly known as: COZAAR Take 25 mg by mouth daily.   multivitamin with minerals Tabs tablet Take 1 tablet by mouth daily.   nystatin cream Commonly known as: MYCOSTATIN Apply topically 2 (two) times daily.   polyethylene glycol 17 g packet Commonly known as: MIRALAX  / GLYCOLAX  Take 17 g by mouth daily as needed for mild constipation.   senna-docusate 8.6-50 MG tablet Commonly known as: Senokot-S Take 2 tablets by mouth at bedtime.   traMADol  50 MG tablet Commonly known as: ULTRAM  Take 0.5 tablets (25 mg total) by mouth daily as needed for up to 6 days for severe pain (pain score 7-10).   triamcinolone ointment 0.1 % Commonly known as: KENALOG Apply 1 application topically 2 (two) times daily as needed.               Durable Medical Equipment  (From admission, onward)           Start     Ordered   10/08/24 2136  For home use only DME Bedside  commode  Once       Question:  Patient needs a bedside commode to treat with the following condition  Answer:  Generalized weakness   10/08/24 2135   10/08/24 2136  For home use only DME Walker rolling  Once       Question Answer Comment  Walker: With 5 Inch Wheels   Patient needs a walker to treat with the following condition Generalized weakness      10/08/24 2135            Contact information for after-discharge care     Destination     [facility]   Service: Skilled Nursing Contact information: [address] [place]  [address] [phone]                    Allergies  Allergen Reactions   Bacitracin-Neomycin-Polymyxin Swelling and Rash    Blisters Blisters  Ace Inhibitors     Other reaction(s): Cough   Omega 3  [Fish Oil] Other (See Comments)   Neosporin [Neomycin-Bacitracin Zn-Polymyx] Rash   Tape Rash    Blisters    Consultations: None   Procedures/Studies: CT ABDOMEN PELVIS W CONTRAST Result Date: 10/04/2024 EXAM: CT ABDOMEN AND PELVIS WITH CONTRAST 10/04/2024 12:36:09 AM TECHNIQUE: CT of the abdomen and pelvis was performed with the administration of 80 mL iohexol  (OMNIPAQUE ) 300 MG/ML solution. Multiplanar reformatted images are provided for review. Automated exposure control, iterative reconstruction, and/or weight-based adjustment of the mA/kV was utilized to reduce the radiation dose to as low as reasonably achievable. COMPARISON: 03/10/2024 CLINICAL HISTORY: constipation, slow abd burning sensation FINDINGS: LOWER CHEST: No acute abnormality. LIVER: The liver is unremarkable. GALLBLADDER AND BILE DUCTS: Gallbladder is unremarkable. No biliary ductal dilatation. SPLEEN: No acute abnormality. PANCREAS: No acute abnormality. ADRENAL GLANDS: No acute abnormality. KIDNEYS, URETERS AND BLADDER: No stones in the kidneys or ureters. No hydronephrosis. No perinephric or periureteral stranding. Urinary bladder is  unremarkable. GI AND BOWEL: Stomach demonstrates no acute abnormality. Marked wall thickening and mucosal hyperenhancement of the rectum. Adjacent stranding. No abscess. Moderate colonic stool burden. There is no bowel obstruction. PERITONEUM AND RETROPERITONEUM: Trace pelvic  free fluid. No free air. VASCULATURE: Aorta is normal in caliber. LYMPH NODES: No lymphadenopathy. REPRODUCTIVE ORGANS: No acute abnormality. BONES AND SOFT TISSUES: No acute osseous abnormality. No focal soft tissue abnormality. IMPRESSION: 1. Infectious or inflammatory proctitis. Electronically signed by: Norman Gatlin MD 10/04/2024 01:11 AM EST RP Workstation: [id number]      Subjective: Seen and examined on day of discharge.  Stable no distress.  Appropriate for discharge to skilled nursing facility.  Discharge Exam: Vitals:   10/10/24 0412 10/10/24 0728  BP: (!) 137/50 (!) 110/46  Pulse: (!) 51 (!) 57  Resp: 16 18  Temp: 97.8 F (36.6 C) 97.9 F (36.6 C)  SpO2: 93% 93%   Vitals:   10/09/24 1545 10/09/24 2009 10/10/24 0412 10/10/24 0728  BP: (!) 112/54 (!) 118/96 (!) 137/50 (!) 110/46  Pulse: 60 60 (!) 51 (!) 57  Resp: 18 16 16 18   Temp: 97.8 F (36.6 C) 97.7 F (36.5 C) 97.8 F (36.6 C) 97.9 F (36.6 C)  TempSrc: Oral Oral  Oral  SpO2: 96% 96% 93% 93%  Weight:      Height:        General: Pt is alert, awake, not in acute distress Cardiovascular: RRR, S1/S2 +, no rubs, no gallops Respiratory: CTA bilaterally, no wheezing, no rhonchi Abdominal: Soft, NT, ND, bowel sounds + Extremities: no edema, no cyanosis    The results of significant diagnostics from this hospitalization (including imaging, microbiology, ancillary and laboratory) are listed below for reference.     Microbiology: Recent Results (from the past 240 hours)  Urine Culture (for pregnant, neutropenic or urologic patients or patients with an indwelling urinary catheter)     Status: None   Collection Time: 10/05/24 10:23 PM    Specimen: Urine, Clean Catch  Result Value Ref Range Status   Specimen Description   Final    URINE, CLEAN CATCH Performed at [facility], [address], [place], [place] [address]    Special Requests   Final    NONE Performed at [facility], [address], [place], [place] [address]    Culture   Final    NO GROWTH Performed at [facility], [address] [place], [place] [address]  Report Status 10/07/2024 FINAL  Final     Labs: BNP (last 3 results) No results for input(s): BNP in the last 8760 hours. Basic Metabolic Panel: Recent Labs  Lab 10/05/24 0550 10/06/24 0514 10/07/24 0619 10/08/24 0514 10/09/24 0434  NA 141 140 138 136 138  K 4.4 4.7 4.5 4.5 4.9  CL 110 109 105 104 105  CO2 22 25 24 24 26   GLUCOSE 88 101* 104* 102* 100*  BUN 19 20 28* 24* 20  CREATININE 1.13* 1.12* 1.08* 0.98 1.13*  CALCIUM  8.5* 8.5* 8.5* 8.5* 9.1   Liver Function Tests: Recent Labs  Lab 10/03/24 1951  AST 32  ALT 6  ALKPHOS 52  BILITOT 1.2  PROT 7.5  ALBUMIN  3.9   Recent Labs  Lab 10/03/24 1951  LIPASE 29   No results for input(s): AMMONIA in the last 168 hours. CBC: Recent Labs  Lab 10/05/24 0550 10/06/24 1415 10/07/24 0619 10/08/24 0514 10/09/24 0434  WBC 4.9 5.2 5.8 5.7 5.2  HGB 9.9* 11.0* 10.5* 10.3* 10.2*  HCT 30.6* 34.1* 32.9* 31.8* 31.9*  MCV 96.5 97.7 96.2 94.9 97.3  PLT 183 208 212 226 214   Cardiac Enzymes: No results for input(s): CKTOTAL, CKMB, CKMBINDEX, TROPONINI in the last 168 hours. BNP: Invalid input(s): POCBNP CBG: No results for input(s): GLUCAP in the last 168 hours. D-Dimer No results for input(s): DDIMER in the last 72 hours. Hgb A1c No results for input(s): HGBA1C in the last 72 hours. Lipid Profile No results for input(s): CHOL, HDL, LDLCALC, TRIG, CHOLHDL, LDLDIRECT in the last 72 hours. Thyroid function studies No results for input(s): TSH, T4TOTAL, T3FREE,  THYROIDAB in the last 72 hours.  Invalid input(s): FREET3 Anemia work up No results for input(s): VITAMINB12, FOLATE, FERRITIN, TIBC, IRON, RETICCTPCT in the last 72 hours. Urinalysis    Component Value Date/Time   COLORURINE YELLOW (A) 10/04/2024 0019   APPEARANCEUR HAZY (A) 10/04/2024 0019   APPEARANCEUR Clear 05/03/2023 1437   LABSPEC 1.013 10/04/2024 0019   LABSPEC 1.015 06/01/2013 1452   PHURINE 5.0 10/04/2024 0019   GLUCOSEU NEGATIVE 10/04/2024 0019   GLUCOSEU 50 mg/dL 92/94/7985 8547   HGBUR NEGATIVE 10/04/2024 0019   BILIRUBINUR NEGATIVE 10/04/2024 0019   BILIRUBINUR Negative 05/03/2023 1437   BILIRUBINUR Negative 06/01/2013 1452   KETONESUR 5 (A) 10/04/2024 0019   PROTEINUR NEGATIVE 10/04/2024 0019   UROBILINOGEN 0.2 09/30/2012 1930   NITRITE POSITIVE (A) 10/04/2024 0019   LEUKOCYTESUR MODERATE (A) 10/04/2024 0019   LEUKOCYTESUR Negative 06/01/2013 1452   Sepsis Labs Recent Labs  Lab 10/06/24 1415 10/07/24 0619 10/08/24 0514 10/09/24 0434  WBC 5.2 5.8 5.7 5.2   Microbiology Recent Results (from the past 240 hours)  Urine Culture (for pregnant, neutropenic or urologic patients or patients with an indwelling urinary catheter)     Status: None   Collection Time: 10/05/24 10:23 PM   Specimen: Urine, Clean Catch  Result Value Ref Range Status   Specimen Description   Final    URINE, CLEAN CATCH Performed at [facility], [address], [place], [place] [address]    Special Requests   Final    NONE Performed at [facility], [address] [place], [place] [address]    Culture   Final    NO GROWTH Performed at [facility], [address] [place], [place] [address]    Report Status 10/07/2024 FINAL  Final     Time coordinating discharge: 40 minutes  SIGNED:  Calvin KATHEE Robson, MD  [facility] 10/10/2024, 3:08 PM Pager   If 7PM-7AM, please contact night-coverage

## 2024-10-10 NOTE — Progress Notes (Signed)
 PROGRESS NOTE    Kimberly Scott  FMW:[id number] DOB: [birth year] DOA: 10/03/2024 PCP: Fernande Ophelia [name], MD    Brief Narrative:   89 year old female with PMHx of Parkinson disease, dementia, HTN, GERD, and prior ESBL UTI presenting from home on 10/03/2024 with constipation, dysuria, and lower abdominal pain.  EMS noted a temp of 101 F.  Per daughter, the patient was mildly confused but had no recent falls or trauma.  On exam, abdomen was soft and nontender.  UA was consistent with UTI with prior cultures growing ESBL organisms.  CT abdomen/pelvis demonstrated marked rectal wall thickening with mucosal hyperenhancement and adjacent fat stranding, suggestive inflammatory versus infectious proctitis, along with moderate colonic stool burden without obstruction.    Assessment & Plan:   Principal Problem:   Proctitis   # Proctitis - EMS reported patient febrile to 101 F. Leukocytosis to 14.3 on admission - CT abdomen/pelvis showed marked rectal wall thickening with mucosal hyperenhancement and surrounding fat stranding, no abscess - Possible infectious or inflammatory etiology - Afebrile, leukocytosis resolved - Discontinued meropenem  - Continue Bowel regimen for associated constipation as below   #Constipation - Moderate colonic stool burden on CT - s/p mineral enema - Having multiple small to medium soft stools, unclear if she passed the bulk of her stool burden - Avoiding digital disimpaction given proctitis  - Continue bowel regimen with daily MiraLAX , Sennakot-S 2 tablets nightly, and PRN dulcolax daily   # UTI - Has history of UTI with ESLB organisms - EMS reported patient febrile to 101 F.  Leukocytosis 14.3 on admission.  - On arrival, patient somewhat confused per daughter's report.  Now back to baseline. - UA concerning for UTI - Urine culture showed no growth.  However sample was collected after patient received multiple doses of meropenem  - Completed 4-day course  of IV meropenem  - No futher abx - Monitor vitals and fever curve   # Parkinson's disease # Dementia - Baseline mild cognitive impairment with acute worsening likely due to infection - Continue home Sinemet  - Delirium precautions   # Sinus bradycardia - Appears chronic - Asymptomatic - EKG today showed normal sinus rhythm with RBBB   # Hypertension - Continue home Losartan   # Debility - PT/OT consulted - recommending SNF.  - Patient and family in agreement - [facility] offered [facility] bed.  Insurance authorization initiated 11/13   DVT prophylaxis: Lovenox  Code Status: DNR Family Communication: Daughter at bedside 11/12, 11/13 Disposition Plan: Status is: Inpatient Remains inpatient appropriate because: Medically stable.  Pending discharge to skilled nursing facility.   Level of care: Med-Surg  Consultants:  None  Procedures:  None  Antimicrobials: None   Subjective: Seen and examined.  Resting in bed.  Patient is motivated to work with therapy.  Continue to recommend skilled nursing facility as next of of care.  Objective: Vitals:   10/09/24 1545 10/09/24 2009 10/10/24 0412 10/10/24 0728  BP: (!) 112/54 (!) 118/96 (!) 137/50 (!) 110/46  Pulse: 60 60 (!) 51 (!) 57  Resp: 18 16 16 18   Temp: 97.8 F (36.6 C) 97.7 F (36.5 C) 97.8 F (36.6 C) 97.9 F (36.6 C)  TempSrc: Oral Oral  Oral  SpO2: 96% 96% 93% 93%  Weight:      Height:        Intake/Output Summary (Last 24 hours) at 10/10/2024 1225 Last data filed at 10/10/2024 1032 Gross per 24 hour  Intake 200 ml  Output --  Net 200  ml   Filed Weights   10/03/24 1936  Weight: 62.6 kg    Examination:  General exam: NAD.  Appears frail Respiratory system: Clear to auscultation. Respiratory effort normal. Cardiovascular system: S1-S2, RRR, no murmurs, no pedal edema Gastrointestinal system: Thin, soft, NT/ND, normal bowel sounds Central nervous system: Alert and oriented x 2. No focal  neurological deficits. Extremities: Symmetric 5 x 5 power. Skin: No rashes, lesions or ulcers Psychiatry: Judgement and insight appear impaired. Mood & affect flattened.     Data Reviewed: I have personally reviewed following labs and imaging studies  CBC: Recent Labs  Lab 10/05/24 0550 10/06/24 1415 10/07/24 0619 10/08/24 0514 10/09/24 0434  WBC 4.9 5.2 5.8 5.7 5.2  HGB 9.9* 11.0* 10.5* 10.3* 10.2*  HCT 30.6* 34.1* 32.9* 31.8* 31.9*  MCV 96.5 97.7 96.2 94.9 97.3  PLT 183 208 212 226 214   Basic Metabolic Panel: Recent Labs  Lab 10/05/24 0550 10/06/24 0514 10/07/24 0619 10/08/24 0514 10/09/24 0434  NA 141 140 138 136 138  K 4.4 4.7 4.5 4.5 4.9  CL 110 109 105 104 105  CO2 22 25 24 24 26   GLUCOSE 88 101* 104* 102* 100*  BUN 19 20 28* 24* 20  CREATININE 1.13* 1.12* 1.08* 0.98 1.13*  CALCIUM  8.5* 8.5* 8.5* 8.5* 9.1   GFR: Estimated Creatinine Clearance: 26.5 mL/min (A) (by C-G formula based on SCr of 1.13 mg/dL (H)). Liver Function Tests: Recent Labs  Lab 10/03/24 1951  AST 32  ALT 6  ALKPHOS 52  BILITOT 1.2  PROT 7.5  ALBUMIN  3.9   Recent Labs  Lab 10/03/24 1951  LIPASE 29   No results for input(s): AMMONIA in the last 168 hours. Coagulation Profile: No results for input(s): INR, PROTIME in the last 168 hours. Cardiac Enzymes: No results for input(s): CKTOTAL, CKMB, CKMBINDEX, TROPONINI in the last 168 hours. BNP (last 3 results) No results for input(s): PROBNP in the last 8760 hours. HbA1C: No results for input(s): HGBA1C in the last 72 hours. CBG: No results for input(s): GLUCAP in the last 168 hours. Lipid Profile: No results for input(s): CHOL, HDL, LDLCALC, TRIG, CHOLHDL, LDLDIRECT in the last 72 hours. Thyroid Function Tests: No results for input(s): TSH, T4TOTAL, FREET4, T3FREE, THYROIDAB in the last 72 hours. Anemia Panel: No results for input(s): VITAMINB12, FOLATE, FERRITIN, TIBC,  IRON, RETICCTPCT in the last 72 hours. Sepsis Labs: No results for input(s): PROCALCITON, LATICACIDVEN in the last 168 hours.  Recent Results (from the past 240 hours)  Urine Culture (for pregnant, neutropenic or urologic patients or patients with an indwelling urinary catheter)     Status: None   Collection Time: 10/05/24 10:23 PM   Specimen: Urine, Clean Catch  Result Value Ref Range Status   Specimen Description   Final    URINE, CLEAN CATCH Performed at [facility], [address], [place], [place] [address]    Special Requests   Final    NONE Performed at [facility], [address] [place], [place] [address]    Culture   Final    NO GROWTH Performed at [facility], [address], [place], [place] [address]    Report Status 10/07/2024 FINAL  Final         Radiology Studies: No results found.      Scheduled Meds:  aspirin  EC  81 mg Oral Daily   atorvastatin   40 mg Oral q1800   carbidopa -levodopa   1 tablet Oral 2 times  per day   carbidopa -levodopa   2 tablet Oral Q0600   enoxaparin  (LOVENOX ) injection  30 mg Subcutaneous Q24H   estradiol   1 Applicatorful Vaginal Once per day on Monday Wednesday Friday   losartan  25 mg Oral Daily   multivitamin with minerals  1 tablet Oral Daily   nystatin cream   Topical BID   polyethylene glycol  17 g Oral Daily   senna-docusate  2 tablet Oral QHS   Continuous Infusions:   LOS: 6 days      Calvin KATHEE Robson, MD [facility]   If 7PM-7AM, please contact night-coverage  10/10/2024, 12:25 PM

## 2024-10-10 NOTE — TOC Transition Note (Signed)
 Transition of Care [facility]) - Discharge Note   Patient Details  Name: DELANNA BLACKETER MRN: [id number] Date of Birth: Jun 20, 1928  Transition of [facility]) CM/SW Contact:  Nathanael CHRISTELLA Ring, RN Phone Number: 10/10/2024, 4:19 PM   Clinical Narrative:     Insurance authorization approved for [employer].  Daughter is still at the bedside.  Notified her that insurance auth approved and [facility] can accept her today.  She does not qualify for EMS transport, daughter says that is not a problem she can transport as long as staff can help her get her in the care, that will not be a problem.  Bedside RN will call report.  DNR signed by MD and will be sent with patient in the DC packet.  Daughter has no questions or concerns.  Final next level of care: Skilled Nursing Facility Barriers to Discharge: Barriers Resolved   Patient Goals and CMS Choice Patient states their goals for this hospitalization and ongoing recovery are:: Short term rehab and then back home CMS Medicare.gov Compare Post Acute Care list provided to:: Patient Choice offered to / list presented to : Patient, Adult Children      Discharge Placement              Patient chooses bed at: [facility] Patient to be transferred to facility by: Daughter- Cathy Name of family member notified: Donny Patient and family notified of of transfer: 10/10/24  Discharge Plan and Services Additional resources added to the After Visit Summary for     Discharge Planning Services: CM Consult Post Acute Care Choice: Skilled Nursing Facility                               Social Drivers of Health (SDOH) Interventions SDOH Screenings   Food Insecurity: No Food Insecurity (10/04/2024)  Housing: Low Risk  (10/04/2024)  Transportation Needs: No Transportation Needs (10/04/2024)  Utilities: Not At Risk (10/04/2024)  Financial Resource Strain: Low Risk  (02/26/2024)   Received from [facility]  Physical  Activity: Unknown (07/31/2019)  Social Connections: Patient Declined (10/04/2024)  Tobacco Use: Low Risk  (10/06/2024)     Readmission Risk Interventions     No data to display

## 2024-10-10 NOTE — TOC Progression Note (Signed)
 Transition of Care [facility]) - Progression Note    Patient Details  Name: KEYAIRRA KOLINSKI MRN: [id number] Date of Birth: 08-31-1928  Transition of Care [facility] CM/SW Contact  [name], RN Phone Number: 10/10/2024, 2:08 PM  Clinical Narrative:    [facility] pending for [facility]    Expected Discharge Plan: Skilled Nursing Facility Barriers to Discharge: Insurance Authorization               Expected Discharge Plan and Services   Discharge Planning Services: CM Consult Post Acute Care Choice: Skilled Nursing Facility                                         Social Drivers of Health (SDOH) Interventions SDOH Screenings   Food Insecurity: No Food Insecurity (10/04/2024)  Housing: Low Risk  (10/04/2024)  Transportation Needs: No Transportation Needs (10/04/2024)  Utilities: Not At Risk (10/04/2024)  Financial Resource Strain: Low Risk  (02/26/2024)   Received from [facility]  Physical Activity: Unknown (07/31/2019)  Social Connections: Patient Declined (10/04/2024)  Tobacco Use: Low Risk  (10/06/2024)    Readmission Risk Interventions     No data to display

## 2024-10-10 NOTE — H&P (Deleted)
 Noted patient with PASRR ending in A which means there is no end date Per [facility] at [facility] she spoke with [facility], and they are saying another one needs to be submitted.  Submitted through [facility] must and PASRR [id number] A

## 2024-10-10 NOTE — Progress Notes (Signed)
 Physical Therapy Treatment Patient Details Name: Kimberly Scott MRN: [id number] DOB: 06/02/1928 Today's Date: 10/10/2024   History of Present Illness 89 year old female with PMHx of Parkinson disease, dementia, HTN, GERD, and prior ESBL UTI presenting from home on 10/03/2024 with constipation, dysuria, and lower abdominal pain. Admitted for UTI and proctitis    PT Comments  Pt received in chair, daughter at bedside. Discussed current POC and benefits of STR, pt in agreement. Focused session on transfer training with proper hand placement/technique. Gait training with standard height RW which caused pt to struggle with safety and increased forward trunk flexion. Switched out to YOUTH height RW where pt was able to maintain upright posture with tactile cues and stay inside device. Pt participated in B LE strengthening, scapular pinches, and Left lateral neck stretching. Overall, excellent tolerance for PT, anticipate she will do well at STR once d/c'd.     If plan is discharge home, recommend the following: A little help with walking and/or transfers;A little help with bathing/dressing/bathroom;Help with stairs or ramp for entrance;Assist for transportation;Assistance with cooking/housework   Can travel by private vehicle     Yes  Equipment Recommendations  Rolling walker (2 wheels);BSC/3in1 (YOUTH HEIGHT RW)    Recommendations for Other Services       Precautions / Restrictions Precautions Precautions: Fall Recall of Precautions/Restrictions: Impaired Restrictions Weight Bearing Restrictions Per Provider Order: No     Mobility  Bed Mobility Overal bed mobility: Needs Assistance Bed Mobility: Supine to Sit     Supine to sit: Supervision, HOB elevated, Used rails     General bed mobility comments:  (Increased time needed to perform task)    Transfers Overall transfer level: Needs assistance Equipment used: Rolling walker (2 wheels) Transfers: Sit to/from Stand Sit to  Stand: Contact guard assist           General transfer comment:  (Cues for hand placement and attaining upright posture upon standing at RW)    Ambulation/Gait Ambulation/Gait assistance: Min assist, Contact guard assist Gait Distance (Feet):  (85) Assistive device: Rolling walker (2 wheels) (YOUTH HEIGHT) Gait Pattern/deviations: Step-through pattern, Trunk flexed, Wide base of support Gait velocity: decreased     General Gait Details:  (Poor support from regular height RW, switched out for Youth height with much improved balance and ability to correct forward flexed posture and safety)   Stairs             Wheelchair Mobility     Tilt Bed    Modified Rankin (Stroke Patients Only)       Balance Overall balance assessment: Needs assistance Sitting-balance support: Feet supported, No upper extremity supported Sitting balance-Leahy Scale: Good     Standing balance support: Bilateral upper extremity supported, During functional activity, Reliant on assistive device for balance Standing balance-Leahy Scale: Fair Standing balance comment: statically does well but with increased gait distances it decreases                            Communication Communication Communication: Impaired Factors Affecting Communication: Hearing impaired  Cognition Arousal: Alert Behavior During Therapy: WFL for tasks assessed/performed                           PT - Cognition Comments:  (Pleasant and cooperative throughout session) Following commands: Intact      Cueing Cueing Techniques: Verbal cues, Visual cues, Tactile cues  Exercises General Exercises -  Lower Extremity Ankle Circles/Pumps: AROM, Both, 10 reps, Seated Long Arc Quad: AROM, Both, 10 reps, Seated Hip Flexion/Marching: AROM, Both, 10 reps, Seated Other Exercises Other Exercises:  (Pt completed lateral cervical stretches to Left and educated on positioning to correct tightness, scapular  pinches x 5 to facilitate upright posture)    General Comments General comments (skin integrity, edema, etc.): Pt and daughter educated on role of acute PT, benefits of STR, and importance of increasing daily mobility      Pertinent Vitals/Pain Pain Assessment Pain Assessment: No/denies pain    Home Living                          Prior Function            PT Goals (current goals can now be found in the care plan section) Acute Rehab PT Goals Patient Stated Goal: Pt wants to go home Progress towards PT goals: Progressing toward goals    Frequency    Min 2X/week      PT Plan      Co-evaluation              AM-PAC PT 6 Clicks Mobility   Outcome Measure  Help needed turning from your back to your side while in a flat bed without using bedrails?: A Little Help needed moving from lying on your back to sitting on the side of a flat bed without using bedrails?: A Little Help needed moving to and from a bed to a chair (including a wheelchair)?: A Little Help needed standing up from a chair using your arms (e.g., wheelchair or bedside chair)?: A Little Help needed to walk in hospital room?: A Little Help needed climbing 3-5 steps with a railing? : A Lot 6 Click Score: 17    End of Session Equipment Utilized During Treatment: Gait belt Activity Tolerance: Patient tolerated treatment well;Patient limited by fatigue Patient left: in chair;with call bell/phone within reach;with chair alarm set;with family/visitor present Nurse Communication: Mobility status PT Visit Diagnosis: Unsteadiness on feet (R26.81);Other abnormalities of gait and mobility (R26.89);Muscle weakness (generalized) (M62.81);History of falling (Z91.81);Difficulty in walking, not elsewhere classified (R26.2)     Time: 8964-8885 PT Time Calculation (min) (ACUTE ONLY): 39 min  Charges:    $Gait Training: 8-22 mins $Therapeutic Exercise: 8-22 mins $Therapeutic Activity: 8-22 mins PT  General Charges $$ ACUTE PT VISIT: 1 Visit                    [name] Bohr, PTA  [name] JAYSON Bohr 10/10/2024, 11:49 AM

## 2024-10-10 NOTE — TOC Progression Note (Signed)
 Transition of Care [facility]) - Progression Note    Patient Details  Name: Kimberly Scott MRN: [id number] Date of Birth: 1927-12-27  Transition of Care [facility]) CM/SW Contact  Nathanael CHRISTELLA Ring, RN Phone Number: 10/10/2024, 11:31 AM  Clinical Narrative:    [facility] made [facility] bed offer, all bed offers presented to daughter at the bedside and patient.  They choose [facility].  Bed offer accepted in the hub.  Patient and daughter confirmed that she did work with PT today.   Expected Discharge Plan: Skilled Nursing Facility Barriers to Discharge: Insurance Authorization               Expected Discharge Plan and Services   Discharge Planning Services: CM Consult Post Acute Care Choice: Skilled Nursing Facility                                         Social Drivers of Health (SDOH) Interventions SDOH Screenings   Food Insecurity: No Food Insecurity (10/04/2024)  Housing: Low Risk  (10/04/2024)  Transportation Needs: No Transportation Needs (10/04/2024)  Utilities: Not At Risk (10/04/2024)  Financial Resource Strain: Low Risk  (02/26/2024)   Received from [facility]  Physical Activity: Unknown (07/31/2019)  Social Connections: Patient Declined (10/04/2024)  Tobacco Use: Low Risk  (10/06/2024)    Readmission Risk Interventions     No data to display

## 2024-10-10 NOTE — TOC Progression Note (Signed)
 Transition of Care [facility]) - Progression Note    Patient Details  Name: Kimberly Scott MRN: [id number] Date of Birth: August 14, 1928  Transition [facility] Care [facility]) CM/SW Contact  [name], RN Phone Number: 10/10/2024, 3:29 PM  Clinical Narrative:    Noted patient with PASRR ending in A which means there is no end date Per [facility]ie at [facility] she spoke with [facility], and they are saying another one needs to be submitted.  Submitted through [facility] must and PASRR [id number]   Expected Discharge Plan: Skilled Nursing Facility Barriers to Discharge: Insurance Authorization               Expected Discharge Plan and Services   Discharge Planning Services: CM Consult Post Acute Care Choice: Skilled Nursing Facility   Expected Discharge Date: 10/10/24                                     Social Drivers of Health (SDOH) Interventions SDOH Screenings   Food Insecurity: No Food Insecurity (10/04/2024)  Housing: Low Risk  (10/04/2024)  Transportation Needs: No Transportation Needs (10/04/2024)  Utilities: Not At Risk (10/04/2024)  Financial Resource Strain: Low Risk  (02/26/2024)   Received from [facility]  Physical Activity: Unknown (07/31/2019)  Social Connections: Patient Declined (10/04/2024)  Tobacco Use: Low Risk  (10/06/2024)    Readmission Risk Interventions     No data to display
